# Patient Record
Sex: Female | Born: 1935
Health system: Southern US, Community
[De-identification: ages and names within clinical notes are randomized; demographics above are authoritative.]

## PROBLEM LIST (undated history)

## (undated) DIAGNOSIS — D649 Anemia, unspecified: Secondary | ICD-10-CM

## (undated) DIAGNOSIS — IMO0001 Reserved for inherently not codable concepts without codable children: Secondary | ICD-10-CM

## (undated) DIAGNOSIS — K635 Polyp of colon: Secondary | ICD-10-CM

## (undated) DIAGNOSIS — R0600 Dyspnea, unspecified: Secondary | ICD-10-CM

## (undated) DIAGNOSIS — E7800 Pure hypercholesterolemia, unspecified: Secondary | ICD-10-CM

## (undated) DIAGNOSIS — K859 Acute pancreatitis without necrosis or infection, unspecified: Secondary | ICD-10-CM

## (undated) DIAGNOSIS — F419 Anxiety disorder, unspecified: Secondary | ICD-10-CM

## (undated) DIAGNOSIS — G629 Polyneuropathy, unspecified: Secondary | ICD-10-CM

## (undated) DIAGNOSIS — Z531 Procedure and treatment not carried out because of patient's decision for reasons of belief and group pressure: Secondary | ICD-10-CM

## (undated) DIAGNOSIS — I499 Cardiac arrhythmia, unspecified: Secondary | ICD-10-CM

## (undated) DIAGNOSIS — C4492 Squamous cell carcinoma of skin, unspecified: Secondary | ICD-10-CM

## (undated) DIAGNOSIS — M1990 Unspecified osteoarthritis, unspecified site: Secondary | ICD-10-CM

## (undated) DIAGNOSIS — I4891 Unspecified atrial fibrillation: Secondary | ICD-10-CM

## (undated) DIAGNOSIS — K219 Gastro-esophageal reflux disease without esophagitis: Secondary | ICD-10-CM

## (undated) DIAGNOSIS — D509 Iron deficiency anemia, unspecified: Secondary | ICD-10-CM

## (undated) DIAGNOSIS — E119 Type 2 diabetes mellitus without complications: Secondary | ICD-10-CM

## (undated) DIAGNOSIS — H269 Unspecified cataract: Secondary | ICD-10-CM

## (undated) DIAGNOSIS — I10 Essential (primary) hypertension: Secondary | ICD-10-CM

## (undated) HISTORY — DX: Unspecified atrial fibrillation: I48.91

## (undated) HISTORY — DX: Acute pancreatitis without necrosis or infection, unspecified: K85.90

## (undated) HISTORY — DX: Unspecified osteoarthritis, unspecified site: M19.90

## (undated) HISTORY — DX: Unspecified cataract: H26.9

## (undated) HISTORY — DX: Anemia, unspecified: D64.9

## (undated) HISTORY — DX: Type 2 diabetes mellitus without complications: E11.9

## (undated) HISTORY — DX: Polyp of colon: K63.5

## (undated) HISTORY — DX: Gastro-esophageal reflux disease without esophagitis: K21.9

## (undated) HISTORY — DX: Anxiety disorder, unspecified: F41.9

## (undated) HISTORY — DX: Squamous cell carcinoma of skin, unspecified: C44.92

## (undated) HISTORY — DX: Cardiac arrhythmia, unspecified: I49.9

## (undated) HISTORY — PX: CATARACT EXTRACTION, BILATERAL: SHX1313

## (undated) HISTORY — DX: Polyneuropathy, unspecified: G62.9

## (undated) HISTORY — DX: Pure hypercholesterolemia, unspecified: E78.00

## (undated) HISTORY — DX: Essential (primary) hypertension: I10

## (undated) HISTORY — DX: Iron deficiency anemia, unspecified: D50.9

---
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
## 1964-10-24 HISTORY — PX: BREAST EXCISIONAL BIOPSY: SUR124

## 1976-10-24 HISTORY — PX: VAGINAL HYSTERECTOMY: SUR661

## 1979-10-25 HISTORY — PX: OTHER SURGICAL HISTORY: SHX169

## 1998-05-11 ENCOUNTER — Ambulatory Visit (HOSPITAL_COMMUNITY): Admission: RE | Admit: 1998-05-11 | Discharge: 1998-05-11 | Payer: Self-pay | Admitting: Family Medicine

## 1999-08-18 ENCOUNTER — Encounter: Payer: Self-pay | Admitting: Family Medicine

## 1999-08-18 ENCOUNTER — Ambulatory Visit (HOSPITAL_COMMUNITY): Admission: RE | Admit: 1999-08-18 | Discharge: 1999-08-18 | Payer: Self-pay | Admitting: Family Medicine

## 2001-03-14 ENCOUNTER — Ambulatory Visit (HOSPITAL_COMMUNITY): Admission: RE | Admit: 2001-03-14 | Discharge: 2001-03-14 | Payer: Self-pay | Admitting: Family Medicine

## 2001-03-14 ENCOUNTER — Encounter: Payer: Self-pay | Admitting: Family Medicine

## 2002-07-03 ENCOUNTER — Encounter: Payer: Self-pay | Admitting: Family Medicine

## 2002-07-03 ENCOUNTER — Ambulatory Visit (HOSPITAL_COMMUNITY): Admission: RE | Admit: 2002-07-03 | Discharge: 2002-07-03 | Payer: Self-pay | Admitting: Family Medicine

## 2003-12-08 ENCOUNTER — Ambulatory Visit (HOSPITAL_COMMUNITY): Admission: RE | Admit: 2003-12-08 | Discharge: 2003-12-08 | Payer: Self-pay | Admitting: Family Medicine

## 2004-02-05 ENCOUNTER — Encounter: Admission: RE | Admit: 2004-02-05 | Discharge: 2004-05-05 | Payer: Self-pay | Admitting: Family Medicine

## 2004-07-11 ENCOUNTER — Emergency Department (HOSPITAL_COMMUNITY): Admission: EM | Admit: 2004-07-11 | Discharge: 2004-07-11 | Payer: Self-pay | Admitting: Family Medicine

## 2005-06-06 ENCOUNTER — Ambulatory Visit (HOSPITAL_COMMUNITY): Admission: RE | Admit: 2005-06-06 | Discharge: 2005-06-06 | Payer: Self-pay | Admitting: Family Medicine

## 2006-04-05 ENCOUNTER — Ambulatory Visit: Payer: Self-pay | Admitting: Internal Medicine

## 2006-04-17 ENCOUNTER — Ambulatory Visit: Payer: Self-pay | Admitting: Internal Medicine

## 2006-10-02 ENCOUNTER — Ambulatory Visit (HOSPITAL_COMMUNITY): Admission: RE | Admit: 2006-10-02 | Discharge: 2006-10-02 | Payer: Self-pay | Admitting: Family Medicine

## 2007-02-02 ENCOUNTER — Encounter: Admission: RE | Admit: 2007-02-02 | Discharge: 2007-02-02 | Payer: Self-pay | Admitting: Family Medicine

## 2008-02-07 ENCOUNTER — Ambulatory Visit (HOSPITAL_COMMUNITY): Admission: RE | Admit: 2008-02-07 | Discharge: 2008-02-07 | Payer: Self-pay | Admitting: Family Medicine

## 2008-10-24 HISTORY — PX: CHOLECYSTECTOMY: SHX55

## 2008-11-10 ENCOUNTER — Inpatient Hospital Stay (HOSPITAL_COMMUNITY): Admission: EM | Admit: 2008-11-10 | Discharge: 2008-11-12 | Payer: Self-pay | Admitting: Emergency Medicine

## 2008-11-11 ENCOUNTER — Encounter (INDEPENDENT_AMBULATORY_CARE_PROVIDER_SITE_OTHER): Payer: Self-pay | Admitting: General Surgery

## 2009-04-16 ENCOUNTER — Ambulatory Visit (HOSPITAL_COMMUNITY): Admission: RE | Admit: 2009-04-16 | Discharge: 2009-04-16 | Payer: Self-pay | Admitting: Family Medicine

## 2010-03-09 IMAGING — US US ABDOMEN COMPLETE
1 series · 13 of 25 positions shown · non-contrast
Comparison: Abdominal ultrasound 02/02/2007

CLINICAL DATA: Severe abdominal pain

ABDOMEN ULTRASOUND
TECHNIQUE: Complete abdominal ultrasound examination was performed
including evaluation of the liver, gallbladder, bile ducts,
pancreas, kidneys, spleen, IVC, and abdominal aorta.

[Series 1: unknown · 0.34mm/px · 13 of 65 slices shown]
[im 1/65]
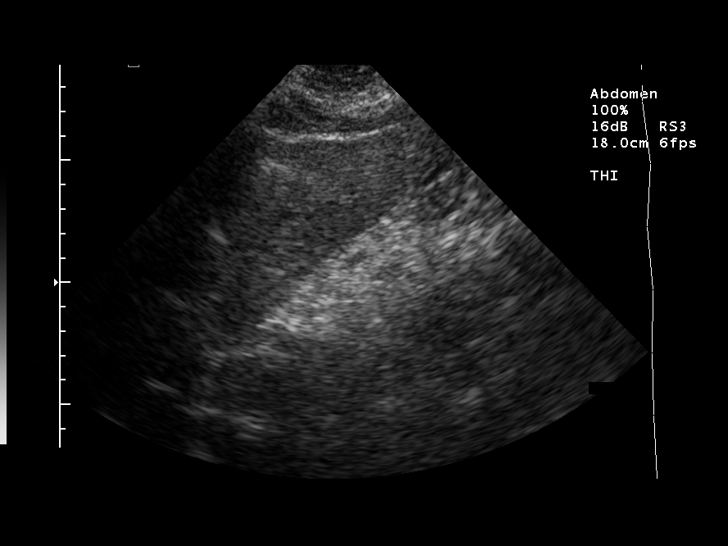
[im 6/65]
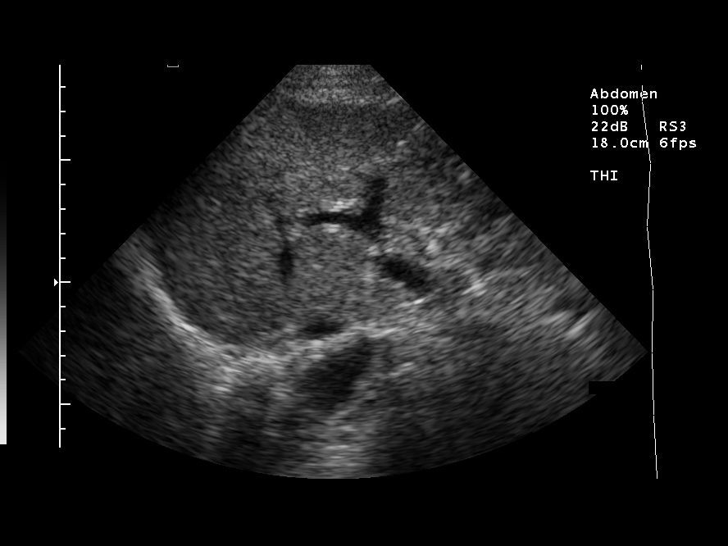
[im 11/65]
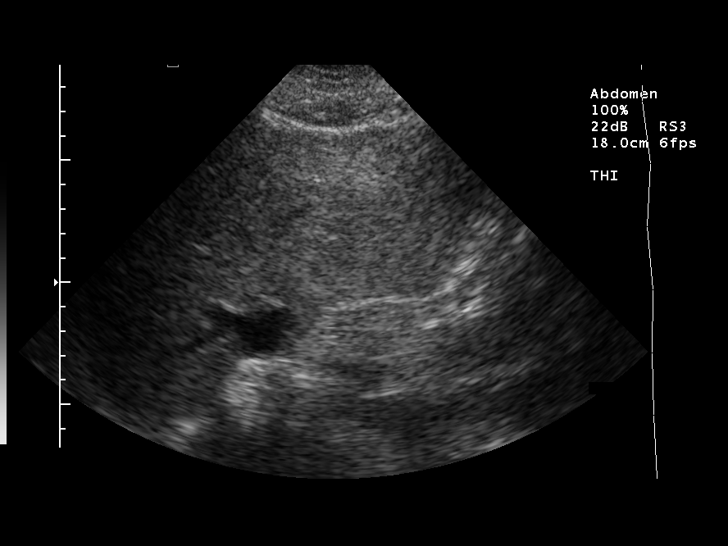
[im 17/65]
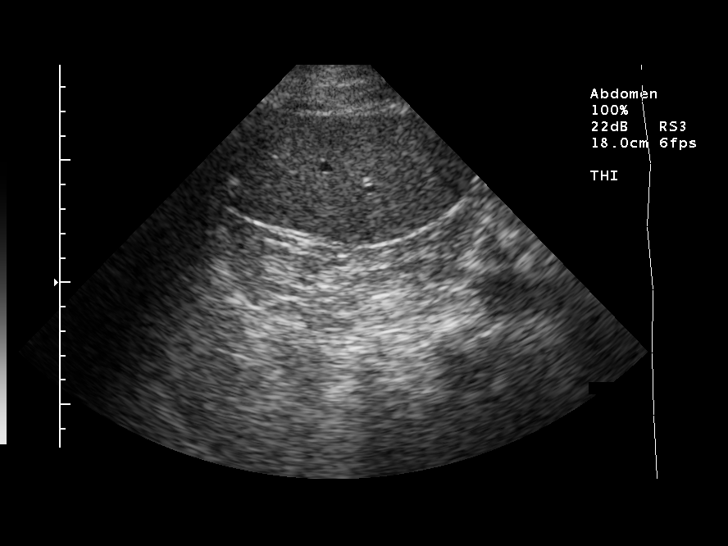
[im 22/65]
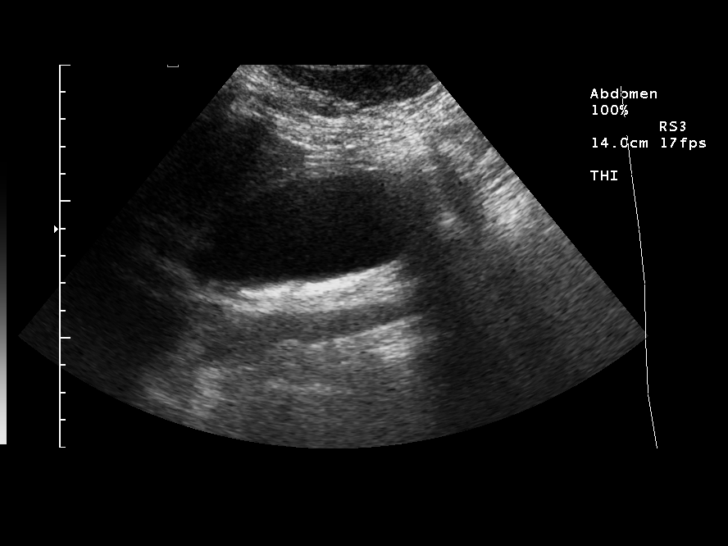
[im 27/65]
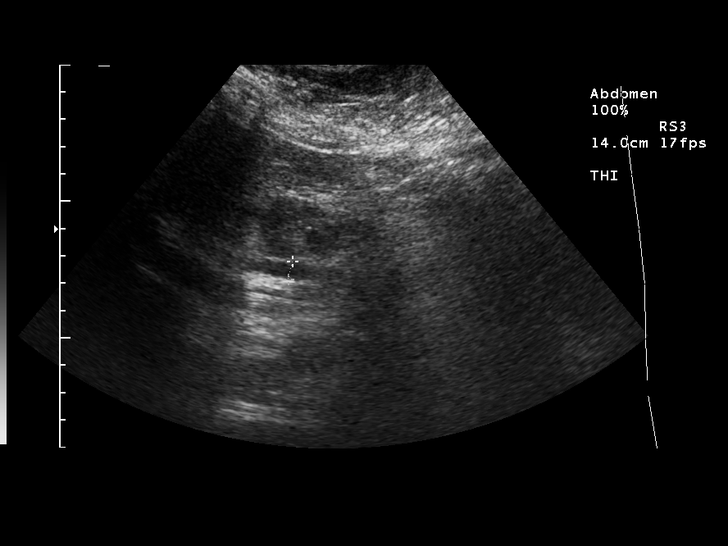
[im 33/65]
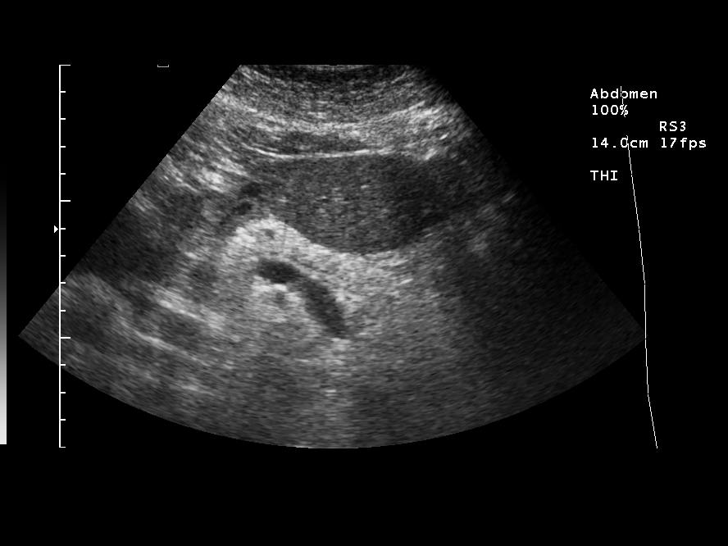
[im 38/65]
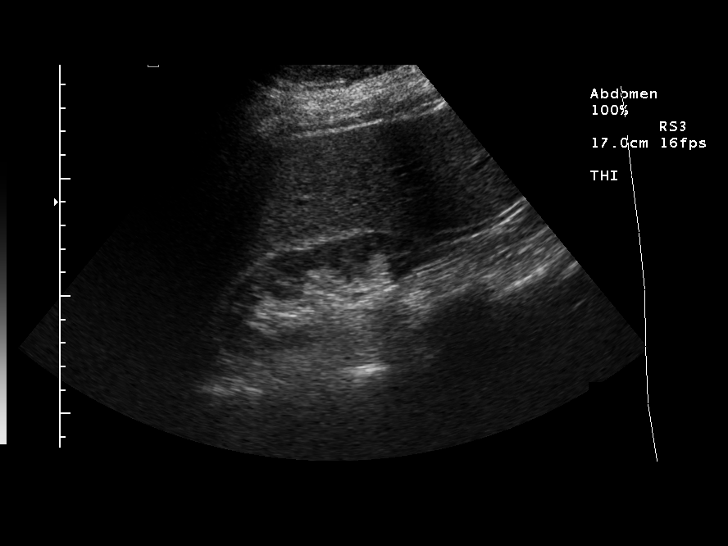
[im 43/65]
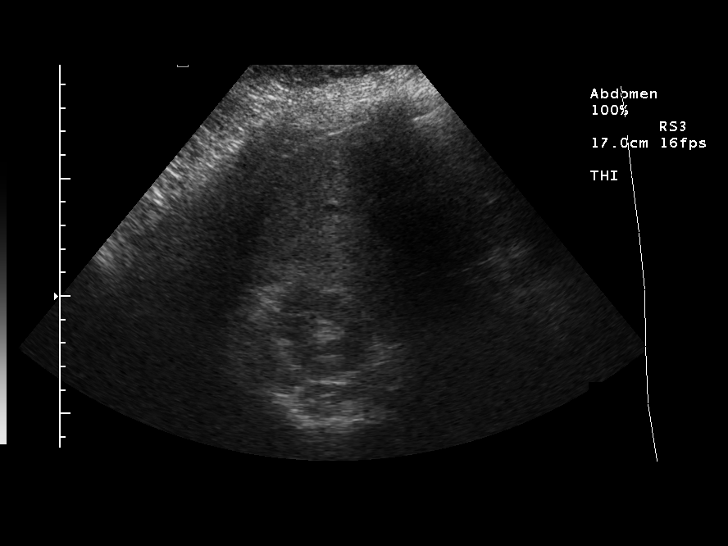
[im 49/65]
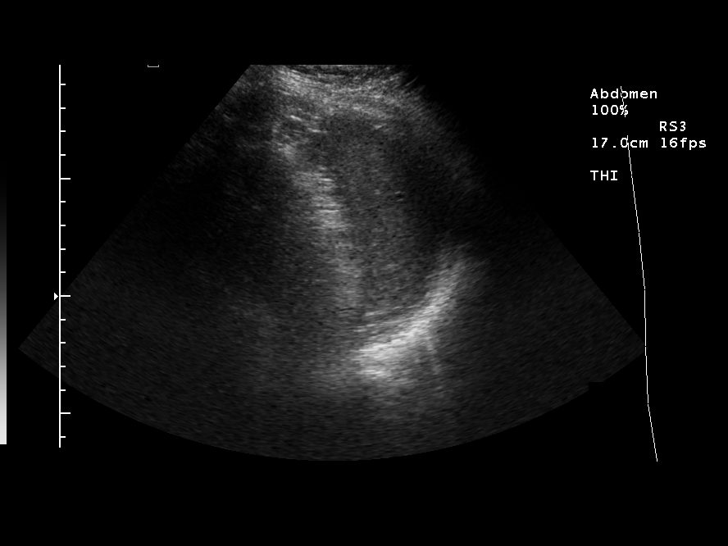
[im 54/65]
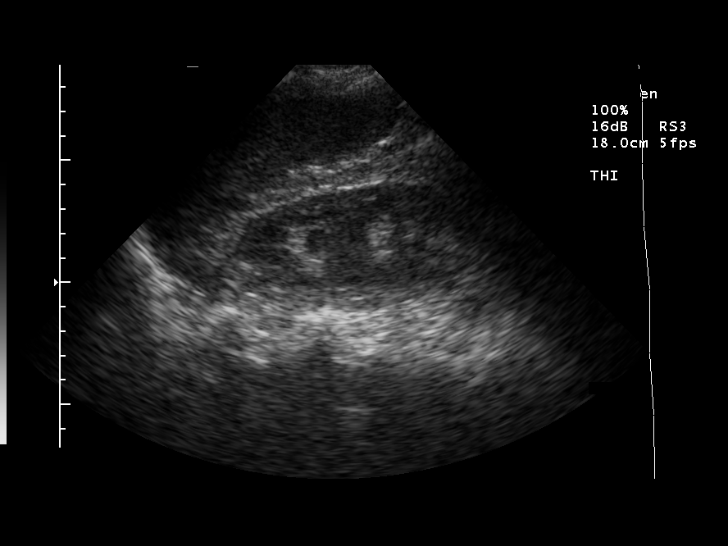
[im 59/65]
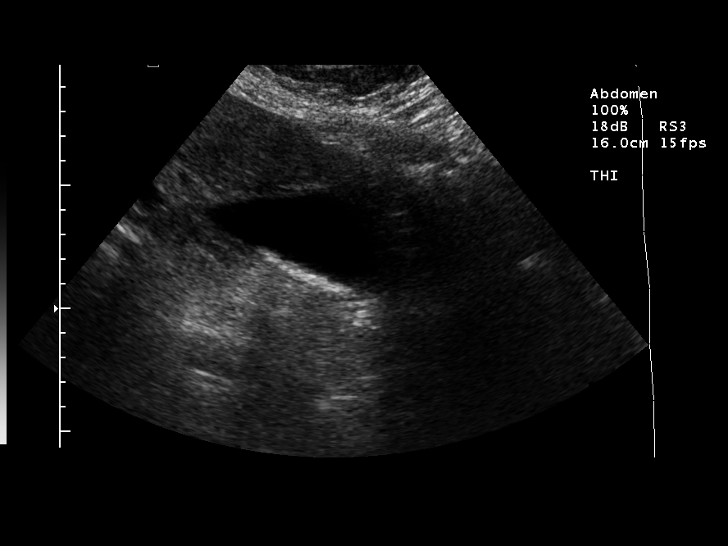
[im 65/65]
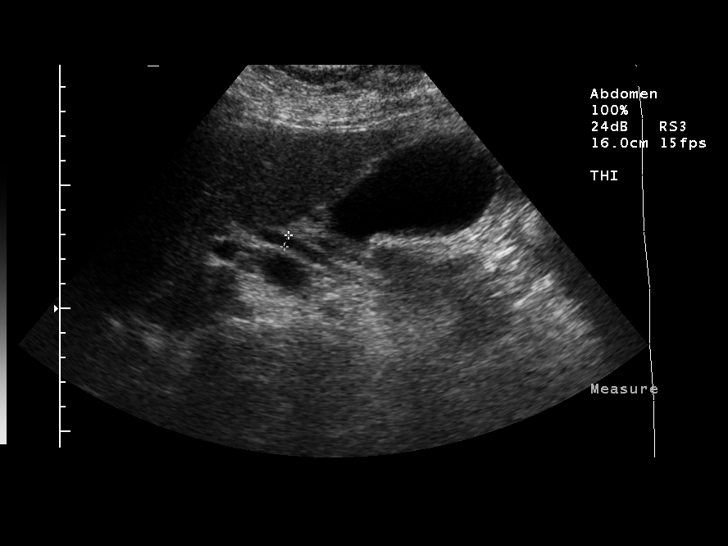

[13 of 25 positions shown; findings below may reference images not displayed]

FINDINGS: There is mild coarsened echogenicity.

Layering sludge versus small stones within the gallbladder.  No
discrete shadowing stones.  The gallbladder wall is thickened to 5
mm and edematous.  There is pericholecystic fluid present.
Positive sonographic Murphy's sign.  Common bile duct is normal at
4.5 mm.

The IVC and pancreas appear normal.

Right kidney measures 11.3 cm.  Left kidney measures 12.4 cm.  No
evidence of hydronephrosis.

The spleen is normal 10.6 cm.

Abdominal aorta is normal caliber 2.4 cm.
IMPRESSION: 1.  Thickened gallbladder wall with pericholecystic fluid and
positive sonographic Murphy's sign is concerning for acute
cholecystitis.  Layering sludge versus small stones are again
demonstrated.
2.  Common bile duct is normal in diameter.

3.  Increased liver echogenicity commonly represents hepatic
steatosis.

Critical test results telephoned to Dr. Nya at the time of
interpretation on 11/10/2008 at [DATE] .

## 2010-04-20 ENCOUNTER — Ambulatory Visit (HOSPITAL_COMMUNITY): Admission: RE | Admit: 2010-04-20 | Discharge: 2010-04-20 | Payer: Self-pay | Admitting: Family Medicine

## 2010-11-14 ENCOUNTER — Encounter: Payer: Self-pay | Admitting: Family Medicine

## 2011-02-07 LAB — CBC
HCT: 33 % — ABNORMAL LOW (ref 36.0–46.0)
Hemoglobin: 11.1 g/dL — ABNORMAL LOW (ref 12.0–15.0)
MCHC: 33.2 g/dL (ref 30.0–36.0)
MCV: 96.4 fL (ref 78.0–100.0)
Platelets: 198 10*3/uL (ref 150–400)
RBC: 4.11 MIL/uL (ref 3.87–5.11)
RDW: 13.3 % (ref 11.5–15.5)
WBC: 4.4 10*3/uL (ref 4.0–10.5)

## 2011-02-07 LAB — HEPATIC FUNCTION PANEL
ALT: 189 U/L — ABNORMAL HIGH (ref 0–35)
Alkaline Phosphatase: 66 U/L (ref 39–117)
Alkaline Phosphatase: 73 U/L (ref 39–117)
Bilirubin, Direct: 0.2 mg/dL (ref 0.0–0.3)
Indirect Bilirubin: 0.4 mg/dL (ref 0.3–0.9)
Indirect Bilirubin: 1 mg/dL — ABNORMAL HIGH (ref 0.3–0.9)
Total Bilirubin: 0.6 mg/dL (ref 0.3–1.2)

## 2011-02-07 LAB — COMPREHENSIVE METABOLIC PANEL
ALT: 278 U/L — ABNORMAL HIGH (ref 0–35)
ALT: 597 U/L — ABNORMAL HIGH (ref 0–35)
AST: 1138 U/L — ABNORMAL HIGH (ref 0–37)
AST: 116 U/L — ABNORMAL HIGH (ref 0–37)
Albumin: 2.9 g/dL — ABNORMAL LOW (ref 3.5–5.2)
Albumin: 3.6 g/dL (ref 3.5–5.2)
Alkaline Phosphatase: 68 U/L (ref 39–117)
BUN: 8 mg/dL (ref 6–23)
BUN: 8 mg/dL (ref 6–23)
Calcium: 8.8 mg/dL (ref 8.4–10.5)
Chloride: 104 mEq/L (ref 96–112)
Chloride: 104 mEq/L (ref 96–112)
GFR calc Af Amer: >60 mL/min (ref >60)
GFR calc non Af Amer: >60 mL/min (ref >60)
Glucose, Bld: 176 mg/dL — ABNORMAL HIGH (ref 70–99)
Potassium: 3.3 mEq/L — ABNORMAL LOW (ref 3.5–5.1)
Potassium: 3.8 mEq/L (ref 3.5–5.1)
Sodium: 139 mEq/L (ref 135–145)
Sodium: 142 mEq/L (ref 135–145)
Total Bilirubin: 0.9 mg/dL (ref 0.3–1.2)
Total Protein: 6.7 g/dL (ref 6.0–8.3)

## 2011-02-07 LAB — URINALYSIS, ROUTINE W REFLEX MICROSCOPIC
Glucose, UA: NEGATIVE mg/dL
Nitrite: NEGATIVE
Protein, ur: NEGATIVE mg/dL
pH: 5 (ref 5.0–8.0)

## 2011-02-07 LAB — CREATININE, SERUM
GFR calc Af Amer: >60 mL/min (ref >60)
GFR calc non Af Amer: >60 mL/min (ref >60)

## 2011-02-07 LAB — URINE MICROSCOPIC-ADD ON

## 2011-02-07 LAB — GLUCOSE, CAPILLARY
Glucose-Capillary: 124 mg/dL — ABNORMAL HIGH (ref 70–99)
Glucose-Capillary: 128 mg/dL — ABNORMAL HIGH (ref 70–99)
Glucose-Capillary: 132 mg/dL — ABNORMAL HIGH (ref 70–99)
Glucose-Capillary: 152 mg/dL — ABNORMAL HIGH (ref 70–99)
Glucose-Capillary: 189 mg/dL — ABNORMAL HIGH (ref 70–99)
Glucose-Capillary: 190 mg/dL — ABNORMAL HIGH (ref 70–99)
Glucose-Capillary: 233 mg/dL — ABNORMAL HIGH (ref 70–99)
Glucose-Capillary: 91 mg/dL (ref 70–99)

## 2011-02-07 LAB — DIFFERENTIAL
Lymphocytes Relative: 15 % (ref 12–46)
Monocytes Absolute: 0.4 10*3/uL (ref 0.1–1.0)
Monocytes Relative: 6 % (ref 3–12)
Neutrophils Relative %: 77 % (ref 43–77)

## 2011-02-07 LAB — LIPID PANEL: VLDL: 45 mg/dL — ABNORMAL HIGH (ref 0–40)

## 2011-02-07 LAB — POTASSIUM: Potassium: 4.6 mEq/L (ref 3.5–5.1)

## 2011-02-07 LAB — AMYLASE: Amylase: 253 U/L — ABNORMAL HIGH (ref 27–131)

## 2011-03-08 NOTE — Discharge Summary (Signed)
NAME:  Danielle [name], Danielle [name]                ACCOUNT NO.:  [id number]   MEDICAL RECORD NO.:  [id number]          PATIENT TYPE:  INP   LOCATION:  5118                         FACILITY:  [facility]   PHYSICIAN:  Adolph Pollack, M.D.DATE OF BIRTH:  [birth year]   DATE OF ADMISSION:  11/09/2008  DATE OF DISCHARGE:  11/12/2008                               DISCHARGE SUMMARY   ADMITTING PHYSICIAN:  Ardeth Sportsman, MD   DISCHARGE PHYSICIAN:  Adolph Pollack, MD   There were no consultants.   PROCEDURE:  Laparoscopic cholecystectomy with intraoperative  cholangiogram done by Dr. Abbey Chatters on November 11, 2008.   REASON FOR ADMISSION:  Danielle [name] is a 76 year old female who has been  having intermittent abdominal pain for the past few years.  Apparently,  she has had 5 attacks earlier this year.  The morning of admission, she  woke up and was having severe right upper quadrant abdominal pain and  she also had some associated nausea.  At that time, she presented to the  emergency department where she was found to have a lipase of 6348, along  with a total bilirubin of 1.4 with AST of 1138 and ALT of 597.  On  ultrasound the gallbladder shows wall thickening of pericholecystic  fluid and a sonographic Murphy sign.  Please see admitting history and  physical for further details.   ADMITTING DIAGNOSES:  1. Biliary pancreatitis.  2. Acute cholecystitis.  3. Diabetes mellitus.  4. Hypertension.  5. History of diabetic neuropathy.   HOSPITAL COURSE:  At this time, the patient was admitted and started on  IV antibiotics including clindamycin and Cipro.  She was kept n.p.o.  secondary to her pancreatitis.  On hospital day 1, the patient was  feeling better and no longer having that much abdominal pain.  Her  lipase was still elevated at this time, the patient was continued n.p.o.  along with antibiotics to cool off prior to having laparoscopic  cholecystectomy.  By hospital day 2, the patient's lipase  had decreased  from greater than 2269 and all of her other LFTs had also decreased with  an AST now of 116 and a ALT of 278.  At this time, her total bilirubin  had also decreased from 1.7 to 0.9.  On exam, her abdomen was soft with  very minimal right upper quadrant tenderness.  At this time, it was felt  the patient was stable for laparoscopic cholecystectomy.  This was done  and the patient did not have any complications and tolerated the  procedure well.  On postoperative day 1, the patient was doing well and  her pain was well controlled with her Percocet.  She was tolerating a  regular diet.  On exam, her abdomen was soft, appropriately tender with  active bowel sounds and her incisions were clean, dry, and intact.  At  this time, the patient was stable for discharge home.   DISCHARGE DIAGNOSES:  1. Biliary pancreatitis.  2. Acute cholecystitis.  3. Status post laparoscopic cholecystectomy.  4. Diabetes mellitus.  5. Hypertension.  6. History of diabetic neuropathy.  DISCHARGE MEDICATIONS:  The patient may resume all home medications  including;  1. Actos 45 mg daily.  2. Glucophage 1000 mg 2 times a day.  3. Lipitor 45 mg daily.  4. Neurontin 300 mg daily.  5. Lisinopril 20 mg daily.  6. Aspirin 81 mg daily.  7. Lantus insulin 22 or 23 units subcu, it looks like at hour of      sleep.  8. She was also given a prescription for Percocet 5/325 one to two      p.o. q.4 h. p.r.n. pain.  9. She was also informed she may take any over-the-counter stool      softeners as needed for constipation.   DISCHARGE INSTRUCTIONS:  The patient may resume her normal diabetic  diet.  She may increase her activity slowly and walk up steps.  She may  shower; however, she is not to bathe for the next few weeks.  She does  not need to do any heavy lifting greater than 10 or 15 pounds for the  next few weeks and she is not to drive while taking narcotic pain  medicines.  She is informed she  may  remove her incision dressings in  approximately 2 or 3 days.  She is informed to call our office for fever  greater than 101.5, worsening abdominal pain, no redness, or pus-like  drainage from any of her incisions.  Otherwise, she is to follow up with  the [facility] on November 25, 2008, at 2 o'clock.      Letha Cape, PA      Adolph Pollack, M.D.  Electronically Signed    KEO/MEDQ  D:  11/12/2008  T:  11/12/2008  Job:  [id number]   cc:   Duncan Dull, M.D.

## 2011-03-08 NOTE — Op Note (Signed)
NAME:  VONDELL, SOWELL                ACCOUNT NO.:  [id number]   MEDICAL RECORD NO.:  [id number]          PATIENT TYPE:  INP   LOCATION:  5118                         FACILITY:  [facility]   PHYSICIAN:  Adolph Pollack, M.D.DATE OF BIRTH:  [birth year]   DATE OF PROCEDURE:  11/11/2008  DATE OF DISCHARGE:                               OPERATIVE REPORT   PREOPERATIVE DIAGNOSIS:  Gallstone pancreatitis.   POSTOPERATIVE DIAGNOSIS:  Gallstone pancreatitis.   PROCEDURE:  Laparoscopic cholecystectomy with intraoperative  cholangiogram.   SURGEON:  Adolph Pollack, MD   ASSISTANT:  Clovis Pu. Cornett, MD   ANESTHESIA:  General.   INDICATIONS:  This 76 year old female was admitted with gallstone  pancreatitis.  She has progressively improved and now presents for  urgent cholecystectomy.   TECHNIQUE:  She was brought to the operating room, placed supine on the  operating table, and general anesthetic was administered.  Her abdominal  wall was sterilely prepped and draped.  Marcaine was infiltrated in the  subumbilical region.  A small subumbilical incision was made through the  skin and subcutaneous tissues, fascia, and peritoneum.  The peritoneal  cavity was entered under direct vision.  A pursestring suture of 0  Vicryl was placed around the fascial edges.  A Hasson trocar was  introduced to the peritoneal cavity and pneumoperitoneum created by  insufflation of CO2 gas.   Following this, a laparoscope was introduced.  She was placed in reverse  Trendelenburg position with the right side tilted slightly up.  An 11-mm  trocar was placed through an epigastric incision and two 5-mm trocars  were placed in the right upper quadrant.  The fundus of the gallbladder  was grasped with omental adhesions to it and these were taken down  bluntly staying close to the gallbladder.  The fundus of the gallbladder  was then able to be retracted toward the right shoulder.  I then  mobilized the  infundibulum of the gallbladder with careful blunt  dissection and then retracted it laterally.  There was an anterior  branch of the cystic artery which I identified lying anterior to the  cystic duct.  A window was created around the anterior branch of the  cystic artery and it was clipped and divided.  I then was able to  isolate the cystic duct and create a window around it.  A clip was  placed in the cystic duct gallbladder junction.  A small incision was  made in the cystic duct.  I then placed a cholangiocatheter through the  anterior abdominal wall, placed into the cystic duct, and a  cholangiogram performed.   Under real time fluoroscopy, dilute contrast was injected to the cystic  duct which was of long length.  Common hepatic, right left hepatic,  common bile ducts were all filled and contrast drained promptly to the  duodenum without any obvious evidence of obstruction.  Final reports  pending the radiologist's interpretation.   Following this, the cholangiocatheter was removed, the cystic duct was  clipped 3 times on the biliary side, and divided.  I identified the  posterior  branch of the cystic artery and it was clipped and divided.  I  then dissected the gallbladder free from liver using electrocautery and  placed in Endopouch bag.  The gallbladder fossa was copiously irrigated  and was hemostatic.  The irrigation fluid was evacuated.  The  gallbladder was then removed through the subumbilical port.   I re-inspected the gallbladder fossa and it was hemostatic without  evidence of bile leak.  I then removed the trocars and released the  pneumoperitoneum.  Skin incisions were closed with 4-0 Monocryl  subcuticular stitches.  Steri-Strips and sterile dressings were applied.  She tolerated the procedure well without any apparent complications and  was taken to recovery room in satisfactory condition.      Adolph Pollack, M.D.  Electronically Signed     TJR/MEDQ   D:  11/12/2008  T:  11/12/2008  Job:  [id number]

## 2011-03-08 NOTE — H&P (Signed)
NAME:  Danielle [name], Danielle [name]                ACCOUNT NO.:  [id number]   MEDICAL RECORD NO.:  [id number]          PATIENT TYPE:  INP   LOCATION:  1825                         FACILITY:  [facility]   PHYSICIAN:  Ardeth Sportsman, MD     DATE OF BIRTH:  1935-09-28   DATE OF ADMISSION:  11/09/2008  DATE OF DISCHARGE:                              HISTORY & PHYSICAL   PRIMARY CARE PHYSICIAN:  Duncan Dull, MD   REQUESTING PHYSICIAN:  __________ at [facility] Emergency Department.   SURGEON:  Dr. Karie Soda, [facility]   REASON FOR CONSULT ON ADMISSION:  1. Cholecystitis.  2. Probable gallstone pancreatitis.  3. Transaminitis, possible hepatitis.   HISTORY OF PRESENT ILLNESS:  Danielle [name] is a 76 year old obese female  who has had intermittent abdominal pains for the past few years.  She  has had 5 attacks earlier this year.  She had a more severe pain, which  she woke up with this morning.  Pain has been in the upper abdomen,  right greater than the left side.  Early, she wakes up with the pain and  after having some coffee, she gets little bit nauseated and sometimes  throws up and has some diarrhea.  She denies any sick contacts or travel  history.  No hematochezia or melena.  She does have some mild heartburn  and reflux for which she takes Tums and occasional Zantac, this does not  seem like that.   Her pain worsens through the day and based on that she came to the  emergency room almost at 8 p.m. tonight.  Workup is concerning for  cholecystis along with some elevated liver function tests and pancreas  and therefore, surgical consultation was requested by Dr. Randa Evens.   PAST MEDICAL HISTORY:  1. Hypertension.  2. Diabetes.  3. Cholecystolithiasis, large versus small stones based on ultrasound      in April 2008.  4. Low back pain.  5. Diabetic neuropathy.  6. Question of gastroesophageal reflux disease.  7. Hypercholesterolemia.   PAST SURGICAL HISTORY:  She had an  appendectomy at age 37.  She has had  an abdominal hysterectomy.  She had removal of right breast cyst and  right ankle surgery.   ALLERGIES:  AMOXICILLIN and PENICILLIN and questionable allergy to  CODEINE.   MEDICATIONS:  As noted in the chart include Actos, Glucophage, Lantus,  aspirin, Lipitor, lisinopril, multivitamin, Neurontin, Tums, and vitamin  C.   SOCIAL HISTORY:  No tobacco, alcohol, or drug use.  She is here today  with her husband, and her daughter works in [employer] as a Scientist, water quality.   FAMILY HISTORY:  Noncontributory for any major hepatobiliary or  inflammatory bowel disorders that she can recall.   REVIEW OF SYSTEMS:  Noted as per HPI.  CONSTITUTIONAL:  No fever,  chills, or sweat.  No weight gain or weight loss.  EYES:  Negative.  ENT:  Negative.  CARDIAC:  Negative.  RESPIRATORY:  Negative.  She  claims she can walk several miles without much difficulty.  No history  of dyspnea on exertion.  No exertional chest pain or angina.  ABDOMEN:  No sick contacts or travel history.  No history of food poisoning  recently that she can recall.  She normally has a bowel movement about  once or twice a day.  No severe bouts of constipation or diarrhea.  She  occasionally has loose stools with the abdominal pain.  BREAST:  In the  past, normal stable mammograms.  HEME:  Negative.  LYMPH:  Negative.  ALLERGIC:  Negative.  PSYCHIATRIC:  Negative.  NEUROLOGIC:  Some pain in  the feet.  Neurontin seems to help a little bit in the chronic low back  pain.  MUSCULOSKELETAL:  As noted above with some chronic low back pain.   PHYSICAL EXAMINATION:  VITAL SIGNS:  Her temperature is 98.8, her blood  pressure is 130/49, respirations 16, pulses in the 70s, initial pain was  9/10 and now it is down to about 3/10 pain.  She has 99% saturations on  room air.  GENERAL:  She is a well-developed, well-nourished, obese female, lying  in bed, in no acute distress.  PSYCH:  She  seems relaxed with good insight and answered appropriately  to questions and is rather curious.  No evidence of any dementia,  delirium, psychosis, or paranoia.  EYES:  Pupils equal, round, and reactive to light.  Sclerae nonicteric.  Extraocular movements are intact.  NECK:  Supple, no masses.  Trachea is midline.  HEART:  Regular rate and rhythm.  No murmurs, gallops, or rubs.  CHEST:  Clear to auscultation bilaterally.  No wheezes, rales, or  rhonchi.  No pain to rib or sternal compression.  HEENT:  She is normocephalic.  Mucous membranes are somewhat dry, but  nasopharynx and oropharynx are clear.  ABDOMEN:  Soft, but obese.  Belly button shows no hernia.  She has some  mild abdominal discomfort in the upper abdomen right slightly greater  than the left, but no Murphy sign to my examination.  GENITALIA:  External female genitalia with no inguinal hernias.  RECTAL:  Refused.  MUSCULOSKELETAL:  Good range of motion in shoulders, elbows, wrist as  well as hip, knees, and ankles.  LYMPH:  No head, neck, axillary or groin lymphadenopathy.  SKIN:  No petechiae.  No purpura.  No spider telangiectasia or angioma.   LABORATORY VALUES:  Her white count is in normal range around 6.  Her  lipase is 6348.  Her urinalysis just shows trace leukocyte esterase, but  everything else is negative.  Her AST is 1138, ALT is 597, total  bilirubin is 1.4, and slightly elevated alk phos is 77.   She has an ultrasound shows a gallbladder wall thickening with  pericholecystic fluid, sonographic Murphy sign.   ASSESSMENT AND PLAN:  64. A 76 year old female with history concerning for intermittent      biliary colic now with transaminitis and elevated lipase concerning      for gallstone pancreatitis with possible cholecystis, want admit.  2. IV antibiotics.  3. CT scan of the pelvis to make sure that there is no other      abnormalities to explain the markedly elevated liver and pancreas      numbers.   She has no biliary ductal dilatations at this time, which      is all surprising given those numbers but perhaps, she has passed      the stone.  She does not have any history of any alcohol use on  Lipitor.  I doubt that she has any history of hypertriglyceridemia,      although.  She has been on Lipitor for several years.  I do not      think transaminitis can be explained by that.  If her CT scan is      otherwise underwhelming, she may benefit from a hepatitis profile.      We will hold off on that for now and see if her numbers resolve on      their own.  4. I suspect at some point she is going to need a cholecystectomy as      her intermittent symptoms seem more consistent with biliary colic      than anything else.  The anatomy and physiology of hepatobiliary      and pancreatic function was discussed.  Pathophysiology includes      cholecystolithiasis with its risks of cholecystitis,      choledocholithiasis, gallstone, pancreatitis, etc., were discussed.      Options were discussed and recommendation was made for laparoscopic      cholecystectomy with intraoperative cholangiogram.  Risks,      benefits, and alternatives were discussed.  Questions were      answered.  She and her husband agreed to proceed.  I think we need      to wait until lipase is more in normal range and workup is more      complete.  Make sure there is no surprises or any other etiology      for her elevated laboratory values.  5. We will place on IV clindamycin and ciprofloxacin for antibiotic      coverage, given her PENICILLIN allergy.  6. Deep vein thrombosis prophylaxis.  7. Ulcer prophylaxis.  We will put her on some Protonix and follow up      for now.  8. Probable gastroenterology consult when further along.  We will see      if she can spontaneously resolve.      Ardeth Sportsman, MD  Electronically Signed     SCG/MEDQ  D:  11/10/2008  T:  11/10/2008  Job:  [id number]   cc:   Duncan Dull,  M.D.

## 2011-05-10 ENCOUNTER — Encounter: Payer: Self-pay | Admitting: Internal Medicine

## 2011-11-16 DIAGNOSIS — Z0000 Encounter for general adult medical examination without abnormal findings: Secondary | ICD-10-CM | POA: Diagnosis not present

## 2011-11-16 DIAGNOSIS — E1142 Type 2 diabetes mellitus with diabetic polyneuropathy: Secondary | ICD-10-CM | POA: Diagnosis not present

## 2011-11-16 DIAGNOSIS — E782 Mixed hyperlipidemia: Secondary | ICD-10-CM | POA: Diagnosis not present

## 2011-11-16 DIAGNOSIS — E1149 Type 2 diabetes mellitus with other diabetic neurological complication: Secondary | ICD-10-CM | POA: Diagnosis not present

## 2011-11-16 DIAGNOSIS — M549 Dorsalgia, unspecified: Secondary | ICD-10-CM | POA: Diagnosis not present

## 2011-11-16 DIAGNOSIS — N390 Urinary tract infection, site not specified: Secondary | ICD-10-CM | POA: Diagnosis not present

## 2011-11-16 DIAGNOSIS — I10 Essential (primary) hypertension: Secondary | ICD-10-CM | POA: Diagnosis not present

## 2011-11-17 ENCOUNTER — Other Ambulatory Visit (HOSPITAL_COMMUNITY): Payer: Self-pay | Admitting: Family Medicine

## 2011-11-17 DIAGNOSIS — Z1231 Encounter for screening mammogram for malignant neoplasm of breast: Secondary | ICD-10-CM

## 2011-11-21 ENCOUNTER — Ambulatory Visit (HOSPITAL_COMMUNITY)
Admission: RE | Admit: 2011-11-21 | Discharge: 2011-11-21 | Disposition: A | Discharge status: Home / Self Care | Payer: Medicare Other | Source: Ambulatory Visit | Attending: Family Medicine | Admitting: Family Medicine

## 2011-11-21 DIAGNOSIS — Z1231 Encounter for screening mammogram for malignant neoplasm of breast: Secondary | ICD-10-CM | POA: Insufficient documentation

## 2011-11-28 DIAGNOSIS — M5430 Sciatica, unspecified side: Secondary | ICD-10-CM | POA: Diagnosis not present

## 2011-11-28 DIAGNOSIS — M999 Biomechanical lesion, unspecified: Secondary | ICD-10-CM | POA: Diagnosis not present

## 2011-11-29 DIAGNOSIS — R319 Hematuria, unspecified: Secondary | ICD-10-CM | POA: Diagnosis not present

## 2011-11-30 DIAGNOSIS — M999 Biomechanical lesion, unspecified: Secondary | ICD-10-CM | POA: Diagnosis not present

## 2011-11-30 DIAGNOSIS — M5430 Sciatica, unspecified side: Secondary | ICD-10-CM | POA: Diagnosis not present

## 2011-12-01 DIAGNOSIS — M5430 Sciatica, unspecified side: Secondary | ICD-10-CM | POA: Diagnosis not present

## 2011-12-01 DIAGNOSIS — M999 Biomechanical lesion, unspecified: Secondary | ICD-10-CM | POA: Diagnosis not present

## 2011-12-05 DIAGNOSIS — M999 Biomechanical lesion, unspecified: Secondary | ICD-10-CM | POA: Diagnosis not present

## 2011-12-05 DIAGNOSIS — M5430 Sciatica, unspecified side: Secondary | ICD-10-CM | POA: Diagnosis not present

## 2011-12-06 DIAGNOSIS — M999 Biomechanical lesion, unspecified: Secondary | ICD-10-CM | POA: Diagnosis not present

## 2011-12-06 DIAGNOSIS — M5430 Sciatica, unspecified side: Secondary | ICD-10-CM | POA: Diagnosis not present

## 2011-12-08 DIAGNOSIS — M5430 Sciatica, unspecified side: Secondary | ICD-10-CM | POA: Diagnosis not present

## 2011-12-08 DIAGNOSIS — M999 Biomechanical lesion, unspecified: Secondary | ICD-10-CM | POA: Diagnosis not present

## 2011-12-13 DIAGNOSIS — M5430 Sciatica, unspecified side: Secondary | ICD-10-CM | POA: Diagnosis not present

## 2011-12-13 DIAGNOSIS — M999 Biomechanical lesion, unspecified: Secondary | ICD-10-CM | POA: Diagnosis not present

## 2011-12-13 DIAGNOSIS — R319 Hematuria, unspecified: Secondary | ICD-10-CM | POA: Diagnosis not present

## 2011-12-14 DIAGNOSIS — M999 Biomechanical lesion, unspecified: Secondary | ICD-10-CM | POA: Diagnosis not present

## 2011-12-14 DIAGNOSIS — M5430 Sciatica, unspecified side: Secondary | ICD-10-CM | POA: Diagnosis not present

## 2011-12-15 DIAGNOSIS — M999 Biomechanical lesion, unspecified: Secondary | ICD-10-CM | POA: Diagnosis not present

## 2011-12-15 DIAGNOSIS — M5430 Sciatica, unspecified side: Secondary | ICD-10-CM | POA: Diagnosis not present

## 2011-12-26 DIAGNOSIS — M999 Biomechanical lesion, unspecified: Secondary | ICD-10-CM | POA: Diagnosis not present

## 2011-12-26 DIAGNOSIS — M5430 Sciatica, unspecified side: Secondary | ICD-10-CM | POA: Diagnosis not present

## 2012-01-10 DIAGNOSIS — N3946 Mixed incontinence: Secondary | ICD-10-CM | POA: Diagnosis not present

## 2012-01-10 DIAGNOSIS — N318 Other neuromuscular dysfunction of bladder: Secondary | ICD-10-CM | POA: Diagnosis not present

## 2012-01-10 DIAGNOSIS — R3129 Other microscopic hematuria: Secondary | ICD-10-CM | POA: Diagnosis not present

## 2012-01-19 ENCOUNTER — Other Ambulatory Visit: Payer: Self-pay | Admitting: Dermatology

## 2012-01-19 DIAGNOSIS — D485 Neoplasm of uncertain behavior of skin: Secondary | ICD-10-CM | POA: Diagnosis not present

## 2012-01-19 DIAGNOSIS — L980 Pyogenic granuloma: Secondary | ICD-10-CM | POA: Diagnosis not present

## 2012-02-15 DIAGNOSIS — R3129 Other microscopic hematuria: Secondary | ICD-10-CM | POA: Diagnosis not present

## 2012-02-15 DIAGNOSIS — N318 Other neuromuscular dysfunction of bladder: Secondary | ICD-10-CM | POA: Diagnosis not present

## 2012-02-15 DIAGNOSIS — N3946 Mixed incontinence: Secondary | ICD-10-CM | POA: Diagnosis not present

## 2012-02-16 DIAGNOSIS — E1142 Type 2 diabetes mellitus with diabetic polyneuropathy: Secondary | ICD-10-CM | POA: Diagnosis not present

## 2012-02-16 DIAGNOSIS — E1149 Type 2 diabetes mellitus with other diabetic neurological complication: Secondary | ICD-10-CM | POA: Diagnosis not present

## 2012-03-29 DIAGNOSIS — B353 Tinea pedis: Secondary | ICD-10-CM | POA: Diagnosis not present

## 2012-03-29 DIAGNOSIS — B029 Zoster without complications: Secondary | ICD-10-CM | POA: Diagnosis not present

## 2012-06-06 DIAGNOSIS — E1149 Type 2 diabetes mellitus with other diabetic neurological complication: Secondary | ICD-10-CM | POA: Diagnosis not present

## 2012-06-06 DIAGNOSIS — E782 Mixed hyperlipidemia: Secondary | ICD-10-CM | POA: Diagnosis not present

## 2012-06-06 DIAGNOSIS — I10 Essential (primary) hypertension: Secondary | ICD-10-CM | POA: Diagnosis not present

## 2012-06-06 DIAGNOSIS — E1142 Type 2 diabetes mellitus with diabetic polyneuropathy: Secondary | ICD-10-CM | POA: Diagnosis not present

## 2012-06-08 DIAGNOSIS — H5210 Myopia, unspecified eye: Secondary | ICD-10-CM | POA: Diagnosis not present

## 2012-06-08 DIAGNOSIS — H2510 Age-related nuclear cataract, unspecified eye: Secondary | ICD-10-CM | POA: Diagnosis not present

## 2012-06-08 DIAGNOSIS — E119 Type 2 diabetes mellitus without complications: Secondary | ICD-10-CM | POA: Diagnosis not present

## 2012-06-08 DIAGNOSIS — H52209 Unspecified astigmatism, unspecified eye: Secondary | ICD-10-CM | POA: Diagnosis not present

## 2012-07-24 ENCOUNTER — Encounter: Payer: Self-pay | Admitting: Internal Medicine

## 2012-07-26 DIAGNOSIS — Z85828 Personal history of other malignant neoplasm of skin: Secondary | ICD-10-CM | POA: Diagnosis not present

## 2012-07-26 DIAGNOSIS — L570 Actinic keratosis: Secondary | ICD-10-CM | POA: Diagnosis not present

## 2012-07-26 DIAGNOSIS — L821 Other seborrheic keratosis: Secondary | ICD-10-CM | POA: Diagnosis not present

## 2012-09-25 DIAGNOSIS — R82998 Other abnormal findings in urine: Secondary | ICD-10-CM | POA: Diagnosis not present

## 2012-09-25 DIAGNOSIS — N390 Urinary tract infection, site not specified: Secondary | ICD-10-CM | POA: Diagnosis not present

## 2012-12-12 DIAGNOSIS — I10 Essential (primary) hypertension: Secondary | ICD-10-CM | POA: Diagnosis not present

## 2012-12-12 DIAGNOSIS — E7800 Pure hypercholesterolemia, unspecified: Secondary | ICD-10-CM | POA: Diagnosis not present

## 2013-04-01 DIAGNOSIS — E1149 Type 2 diabetes mellitus with other diabetic neurological complication: Secondary | ICD-10-CM | POA: Diagnosis not present

## 2013-04-01 DIAGNOSIS — E782 Mixed hyperlipidemia: Secondary | ICD-10-CM | POA: Diagnosis not present

## 2013-04-01 DIAGNOSIS — I10 Essential (primary) hypertension: Secondary | ICD-10-CM | POA: Diagnosis not present

## 2013-04-01 DIAGNOSIS — E1142 Type 2 diabetes mellitus with diabetic polyneuropathy: Secondary | ICD-10-CM | POA: Diagnosis not present

## 2013-04-05 ENCOUNTER — Other Ambulatory Visit (HOSPITAL_COMMUNITY): Payer: Self-pay | Admitting: Family Medicine

## 2013-04-05 DIAGNOSIS — Z1231 Encounter for screening mammogram for malignant neoplasm of breast: Secondary | ICD-10-CM

## 2013-04-10 ENCOUNTER — Ambulatory Visit (HOSPITAL_COMMUNITY)
Admission: RE | Admit: 2013-04-10 | Discharge: 2013-04-10 | Disposition: A | Discharge status: Home / Self Care | Payer: Medicare Other | Source: Ambulatory Visit | Attending: Family Medicine | Admitting: Family Medicine

## 2013-04-10 DIAGNOSIS — Z1231 Encounter for screening mammogram for malignant neoplasm of breast: Secondary | ICD-10-CM | POA: Insufficient documentation

## 2013-06-17 DIAGNOSIS — E119 Type 2 diabetes mellitus without complications: Secondary | ICD-10-CM | POA: Diagnosis not present

## 2013-06-17 DIAGNOSIS — H2510 Age-related nuclear cataract, unspecified eye: Secondary | ICD-10-CM | POA: Diagnosis not present

## 2013-07-23 DIAGNOSIS — L821 Other seborrheic keratosis: Secondary | ICD-10-CM | POA: Diagnosis not present

## 2013-07-23 DIAGNOSIS — Z85828 Personal history of other malignant neoplasm of skin: Secondary | ICD-10-CM | POA: Diagnosis not present

## 2013-10-09 DIAGNOSIS — I10 Essential (primary) hypertension: Secondary | ICD-10-CM | POA: Diagnosis not present

## 2013-10-09 DIAGNOSIS — Z23 Encounter for immunization: Secondary | ICD-10-CM | POA: Diagnosis not present

## 2013-10-09 DIAGNOSIS — E1142 Type 2 diabetes mellitus with diabetic polyneuropathy: Secondary | ICD-10-CM | POA: Diagnosis not present

## 2013-10-09 DIAGNOSIS — E11319 Type 2 diabetes mellitus with unspecified diabetic retinopathy without macular edema: Secondary | ICD-10-CM | POA: Diagnosis not present

## 2013-10-09 DIAGNOSIS — E782 Mixed hyperlipidemia: Secondary | ICD-10-CM | POA: Diagnosis not present

## 2013-10-09 DIAGNOSIS — F411 Generalized anxiety disorder: Secondary | ICD-10-CM | POA: Diagnosis not present

## 2013-10-09 DIAGNOSIS — E1149 Type 2 diabetes mellitus with other diabetic neurological complication: Secondary | ICD-10-CM | POA: Diagnosis not present

## 2013-10-09 DIAGNOSIS — R300 Dysuria: Secondary | ICD-10-CM | POA: Diagnosis not present

## 2013-11-28 ENCOUNTER — Encounter: Payer: Self-pay | Admitting: General Surgery

## 2014-01-03 DIAGNOSIS — Z23 Encounter for immunization: Secondary | ICD-10-CM | POA: Diagnosis not present

## 2014-01-03 DIAGNOSIS — Z0000 Encounter for general adult medical examination without abnormal findings: Secondary | ICD-10-CM | POA: Diagnosis not present

## 2014-01-03 DIAGNOSIS — M899 Disorder of bone, unspecified: Secondary | ICD-10-CM | POA: Diagnosis not present

## 2014-01-03 DIAGNOSIS — M949 Disorder of cartilage, unspecified: Secondary | ICD-10-CM | POA: Diagnosis not present

## 2014-01-03 DIAGNOSIS — I10 Essential (primary) hypertension: Secondary | ICD-10-CM | POA: Diagnosis not present

## 2014-01-03 DIAGNOSIS — R252 Cramp and spasm: Secondary | ICD-10-CM | POA: Diagnosis not present

## 2014-02-13 DIAGNOSIS — M899 Disorder of bone, unspecified: Secondary | ICD-10-CM | POA: Diagnosis not present

## 2014-04-17 ENCOUNTER — Other Ambulatory Visit (HOSPITAL_COMMUNITY): Payer: Self-pay | Admitting: Family Medicine

## 2014-04-17 DIAGNOSIS — E782 Mixed hyperlipidemia: Secondary | ICD-10-CM | POA: Diagnosis not present

## 2014-04-17 DIAGNOSIS — I10 Essential (primary) hypertension: Secondary | ICD-10-CM | POA: Diagnosis not present

## 2014-04-17 DIAGNOSIS — Z1231 Encounter for screening mammogram for malignant neoplasm of breast: Secondary | ICD-10-CM

## 2014-04-17 DIAGNOSIS — E1149 Type 2 diabetes mellitus with other diabetic neurological complication: Secondary | ICD-10-CM | POA: Diagnosis not present

## 2014-04-17 DIAGNOSIS — E1142 Type 2 diabetes mellitus with diabetic polyneuropathy: Secondary | ICD-10-CM | POA: Diagnosis not present

## 2014-05-06 ENCOUNTER — Ambulatory Visit (HOSPITAL_COMMUNITY)
Admission: RE | Admit: 2014-05-06 | Discharge: 2014-05-06 | Disposition: A | Discharge status: Home / Self Care | Payer: Medicare Other | Source: Ambulatory Visit | Attending: Family Medicine | Admitting: Family Medicine

## 2014-05-06 DIAGNOSIS — Z1231 Encounter for screening mammogram for malignant neoplasm of breast: Secondary | ICD-10-CM | POA: Diagnosis not present

## 2014-06-17 ENCOUNTER — Ambulatory Visit: Payer: Medicare Other | Admitting: Neurology

## 2014-06-18 DIAGNOSIS — H2510 Age-related nuclear cataract, unspecified eye: Secondary | ICD-10-CM | POA: Diagnosis not present

## 2014-06-18 DIAGNOSIS — E119 Type 2 diabetes mellitus without complications: Secondary | ICD-10-CM | POA: Diagnosis not present

## 2014-07-22 DIAGNOSIS — L819 Disorder of pigmentation, unspecified: Secondary | ICD-10-CM | POA: Diagnosis not present

## 2014-07-22 DIAGNOSIS — Z85828 Personal history of other malignant neoplasm of skin: Secondary | ICD-10-CM | POA: Diagnosis not present

## 2014-07-22 DIAGNOSIS — L570 Actinic keratosis: Secondary | ICD-10-CM | POA: Diagnosis not present

## 2014-07-22 DIAGNOSIS — L908 Other atrophic disorders of skin: Secondary | ICD-10-CM | POA: Diagnosis not present

## 2014-07-22 DIAGNOSIS — L821 Other seborrheic keratosis: Secondary | ICD-10-CM | POA: Diagnosis not present

## 2014-07-22 DIAGNOSIS — D692 Other nonthrombocytopenic purpura: Secondary | ICD-10-CM | POA: Diagnosis not present

## 2014-09-10 ENCOUNTER — Encounter: Payer: Self-pay | Admitting: Neurology

## 2014-09-16 ENCOUNTER — Encounter: Payer: Self-pay | Admitting: Neurology

## 2014-09-23 ENCOUNTER — Other Ambulatory Visit: Payer: Self-pay | Admitting: Dermatology

## 2014-09-23 DIAGNOSIS — L570 Actinic keratosis: Secondary | ICD-10-CM | POA: Diagnosis not present

## 2014-09-23 DIAGNOSIS — Z85828 Personal history of other malignant neoplasm of skin: Secondary | ICD-10-CM | POA: Diagnosis not present

## 2014-09-23 DIAGNOSIS — C44622 Squamous cell carcinoma of skin of right upper limb, including shoulder: Secondary | ICD-10-CM | POA: Diagnosis not present

## 2014-09-23 DIAGNOSIS — D485 Neoplasm of uncertain behavior of skin: Secondary | ICD-10-CM | POA: Diagnosis not present

## 2014-10-22 DIAGNOSIS — E1140 Type 2 diabetes mellitus with diabetic neuropathy, unspecified: Secondary | ICD-10-CM | POA: Diagnosis not present

## 2014-10-22 DIAGNOSIS — E782 Mixed hyperlipidemia: Secondary | ICD-10-CM | POA: Diagnosis not present

## 2014-10-22 DIAGNOSIS — F419 Anxiety disorder, unspecified: Secondary | ICD-10-CM | POA: Diagnosis not present

## 2014-10-22 DIAGNOSIS — I10 Essential (primary) hypertension: Secondary | ICD-10-CM | POA: Diagnosis not present

## 2015-01-15 DIAGNOSIS — E782 Mixed hyperlipidemia: Secondary | ICD-10-CM | POA: Diagnosis not present

## 2015-01-15 DIAGNOSIS — I10 Essential (primary) hypertension: Secondary | ICD-10-CM | POA: Diagnosis not present

## 2015-01-15 DIAGNOSIS — Z0000 Encounter for general adult medical examination without abnormal findings: Secondary | ICD-10-CM | POA: Diagnosis not present

## 2015-01-15 DIAGNOSIS — E1140 Type 2 diabetes mellitus with diabetic neuropathy, unspecified: Secondary | ICD-10-CM | POA: Diagnosis not present

## 2015-01-15 DIAGNOSIS — E1165 Type 2 diabetes mellitus with hyperglycemia: Secondary | ICD-10-CM | POA: Diagnosis not present

## 2015-01-15 DIAGNOSIS — Z800 Family history of malignant neoplasm of digestive organs: Secondary | ICD-10-CM | POA: Diagnosis not present

## 2015-02-06 DIAGNOSIS — J219 Acute bronchiolitis, unspecified: Secondary | ICD-10-CM | POA: Diagnosis not present

## 2015-02-06 DIAGNOSIS — R05 Cough: Secondary | ICD-10-CM | POA: Diagnosis not present

## 2015-02-06 DIAGNOSIS — R062 Wheezing: Secondary | ICD-10-CM | POA: Diagnosis not present

## 2015-03-10 ENCOUNTER — Encounter: Payer: Self-pay | Admitting: Internal Medicine

## 2015-03-30 DIAGNOSIS — Z85828 Personal history of other malignant neoplasm of skin: Secondary | ICD-10-CM | POA: Diagnosis not present

## 2015-03-30 DIAGNOSIS — D1801 Hemangioma of skin and subcutaneous tissue: Secondary | ICD-10-CM | POA: Diagnosis not present

## 2015-03-30 DIAGNOSIS — L821 Other seborrheic keratosis: Secondary | ICD-10-CM | POA: Diagnosis not present

## 2015-03-30 DIAGNOSIS — L812 Freckles: Secondary | ICD-10-CM | POA: Diagnosis not present

## 2015-04-09 ENCOUNTER — Other Ambulatory Visit (HOSPITAL_COMMUNITY): Payer: Self-pay | Admitting: Family Medicine

## 2015-04-09 DIAGNOSIS — Z1231 Encounter for screening mammogram for malignant neoplasm of breast: Secondary | ICD-10-CM

## 2015-05-04 ENCOUNTER — Ambulatory Visit (AMBULATORY_SURGERY_CENTER): Payer: Medicare Other

## 2015-05-04 ENCOUNTER — Encounter: Payer: Self-pay | Admitting: Internal Medicine

## 2015-05-04 VITALS — Ht 65.0 in | Wt 186.0 lb

## 2015-05-04 DIAGNOSIS — Z800 Family history of malignant neoplasm of digestive organs: Secondary | ICD-10-CM

## 2015-05-04 MED ORDER — NA SULFATE-K SULFATE-MG SULF 17.5-3.13-1.6 GM/177ML PO SOLN: 1.0000 | Freq: Once | ORAL | Status: DC

## 2015-05-04 NOTE — Progress Notes (Signed)
Not on home 02 Pt not taking diet drugs No egg or soy allergies No hx of anesthesia complications

## 2015-05-18 ENCOUNTER — Encounter: Payer: BLUE CROSS/BLUE SHIELD | Admitting: Internal Medicine

## 2015-05-25 DIAGNOSIS — H52203 Unspecified astigmatism, bilateral: Secondary | ICD-10-CM | POA: Diagnosis not present

## 2015-05-25 DIAGNOSIS — H2513 Age-related nuclear cataract, bilateral: Secondary | ICD-10-CM | POA: Diagnosis not present

## 2015-05-25 DIAGNOSIS — E119 Type 2 diabetes mellitus without complications: Secondary | ICD-10-CM | POA: Diagnosis not present

## 2015-05-26 ENCOUNTER — Ambulatory Visit (HOSPITAL_COMMUNITY)
Admission: RE | Admit: 2015-05-26 | Discharge: 2015-05-26 | Disposition: A | Discharge status: Home / Self Care | Payer: Medicare Other | Source: Ambulatory Visit | Attending: Family Medicine | Admitting: Family Medicine

## 2015-05-26 DIAGNOSIS — Z1231 Encounter for screening mammogram for malignant neoplasm of breast: Secondary | ICD-10-CM | POA: Diagnosis not present

## 2015-07-21 ENCOUNTER — Encounter: Payer: Self-pay | Admitting: Internal Medicine

## 2015-07-21 ENCOUNTER — Ambulatory Visit (AMBULATORY_SURGERY_CENTER): Payer: Medicare Other | Admitting: Internal Medicine

## 2015-07-21 VITALS — BP 127/100 | HR 62 | Temp 95.9°F | Resp 25

## 2015-07-21 DIAGNOSIS — D122 Benign neoplasm of ascending colon: Secondary | ICD-10-CM

## 2015-07-21 DIAGNOSIS — I10 Essential (primary) hypertension: Secondary | ICD-10-CM | POA: Diagnosis not present

## 2015-07-21 DIAGNOSIS — Z800 Family history of malignant neoplasm of digestive organs: Secondary | ICD-10-CM | POA: Diagnosis not present

## 2015-07-21 DIAGNOSIS — K219 Gastro-esophageal reflux disease without esophagitis: Secondary | ICD-10-CM | POA: Diagnosis not present

## 2015-07-21 DIAGNOSIS — Z86010 Personal history of colonic polyps: Secondary | ICD-10-CM | POA: Diagnosis not present

## 2015-07-21 DIAGNOSIS — E119 Type 2 diabetes mellitus without complications: Secondary | ICD-10-CM | POA: Diagnosis not present

## 2015-07-21 DIAGNOSIS — D123 Benign neoplasm of transverse colon: Secondary | ICD-10-CM | POA: Diagnosis not present

## 2015-07-21 LAB — GLUCOSE, CAPILLARY
GLUCOSE-CAPILLARY: 135 mg/dL — AB (ref 65–99)
Glucose-Capillary: 125 mg/dL — ABNORMAL HIGH (ref 65–99)

## 2015-07-21 MED ORDER — SODIUM CHLORIDE 0.9 % IV SOLN: 500.0000 mL | INTRAVENOUS | Status: DC

## 2015-07-21 NOTE — Progress Notes (Signed)
Report to PACU, RN, vss, BBS= Clear.  

## 2015-07-21 NOTE — Progress Notes (Signed)
Called to room to assist during endoscopic procedure.  Patient ID and intended procedure confirmed with present staff. Received instructions for my participation in the procedure from the performing physician.  

## 2015-07-21 NOTE — Op Note (Signed)
[place]  [address] [place], [id number]   COLONOSCOPY PROCEDURE REPORT  PATIENT: Danielle, [name]  MR#: [id number] BIRTHDATE: February 09, 1935 , 80  yrs. old GENDER: female ENDOSCOPIST: Eustace Quail, MD REFERRED [id number] Program Recall PROCEDURE DATE:  07/21/2015 PROCEDURE:   Colonoscopy, surveillance and Colonoscopy with snare polypectomy x 2 First Screening Colonoscopy - Avg.  risk and is 50 yrs.  old or older - No.  Prior Negative Screening - Now for repeat screening. N/A  History of Adenoma - Now for follow-up colonoscopy & has been > or = to 3 yrs.  Yes hx of adenoma.  Has been 3 or more years since last colonoscopy.  Polyps removed today? Yes ASA CLASS:   Class II INDICATIONS:Surveillance due to prior colonic neoplasia, PH Colon Adenoma, and FH Colon or Rectal Adenocarcinoma. Patient with prior colonoscopies 1997, 2002, 2007. Mother in 84s; sister around 28 w/ colon cancer. MEDICATIONS: Monitored anesthesia care and Propofol 200 mg IV  DESCRIPTION OF PROCEDURE:   After the risks benefits and alternatives of the procedure were thoroughly explained, informed consent was obtained.  The digital rectal exam revealed no abnormalities of the rectum.   The LB [id number]  endoscope was introduced through the anus and advanced to the cecum, which was identified by both the appendix and ileocecal valve. No adverse events experienced.   The quality of the prep was excellent. (Suprep was used)  The instrument was then slowly withdrawn as the colon was fully examined. Estimated blood loss is zero unless otherwise noted in this procedure report.  COLON FINDINGS: Two polyps ranging between 3-81mm in size were found in the transverse colon and ascending colon.  A polypectomy was performed with a cold snare.  The resection was complete, the polyp tissue was completely retrieved and sent to histology.   There was moderate diverticulosis noted in the right  colon and left colon. The examination was otherwise normal.  Retroflexed views revealed internal hemorrhoids. The time to cecum = 4.2 Withdrawal time = 11.8   The scope was withdrawn and the procedure completed. COMPLICATIONS: There were no immediate complications.  ENDOSCOPIC IMPRESSION: 1.   Two polyps were found in the transverse colon and ascending colon; polypectomy was performed with a cold snare 2.   Moderate diverticulosis was noted in the right colon and left colon 3.   The examination was otherwise normal  RECOMMENDATIONS: 1. Follow up colonoscopy in 5 years , to be considered if medically fit and willing  eSigned:  Eustace Quail, MD 07/21/2015 11:16 AM   cc: The Patient and Darcus Austin, MD

## 2015-07-21 NOTE — Patient Instructions (Signed)
YOU HAD AN ENDOSCOPIC PROCEDURE TODAY AT THE [facility]:   Refer to the procedure report that was given to you for any specific questions about what was found during the examination.  If the procedure report does not answer your questions, please call your gastroenterologist to clarify.  If you requested that your care partner not be given the details of your procedure findings, then the procedure report has been included in a sealed envelope for you to review at your convenience later.  YOU SHOULD EXPECT: Some feelings of bloating in the abdomen. Passage of more gas than usual.  Walking can help get rid of the air that was put into your GI tract during the procedure and reduce the bloating. If you had a lower endoscopy (such as a colonoscopy or flexible sigmoidoscopy) you may notice spotting of blood in your stool or on the toilet paper. If you underwent a bowel prep for your procedure, you may not have a normal bowel movement for a few days.  Please Note:  You might notice some irritation and congestion in your nose or some drainage.  This is from the oxygen used during your procedure.  There is no need for concern and it should clear up in a day or so.  SYMPTOMS TO REPORT IMMEDIATELY:   Following lower endoscopy (colonoscopy or flexible sigmoidoscopy):  Excessive amounts of blood in the stool  Significant tenderness or worsening of abdominal pains  Swelling of the abdomen that is new, acute  Fever of 100F or higher    For urgent or emergent issues, a gastroenterologist can be reached at any hour by calling [phone].   DIET: Your first meal following the procedure should be a small meal and then it is ok to progress to your normal diet. Heavy or fried foods are harder to digest and may make you feel nauseous or bloated.  Likewise, meals heavy in dairy and vegetables can increase bloating.  Drink plenty of fluids but you should avoid alcoholic beverages for 24  hours.  ACTIVITY:  You should plan to take it easy for the rest of today and you should NOT DRIVE or use heavy machinery until tomorrow (because of the sedation medicines used during the test).    FOLLOW UP: Our staff will call the number listed on your records the next business day following your procedure to check on you and address any questions or concerns that you may have regarding the information given to you following your procedure. If we do not reach you, we will leave a message.  However, if you are feeling well and you are not experiencing any problems, there is no need to return our call.  We will assume that you have returned to your regular daily activities without incident.  If any biopsies were taken you will be contacted by phone or by letter within the next 1-3 weeks.  Please call us at [phone] if you have not heard about the biopsies in 3 weeks.    SIGNATURES/CONFIDENTIALITY: You and/or your care partner have signed paperwork which will be entered into your electronic medical record.  These signatures attest to the fact that that the information above on your After Visit Summary has been reviewed and is understood.  Full responsibility of the confidentiality of this discharge information lies with you and/or your care-partner.   Information on polyps,diverticulosis ,and high fiber diet given to you today 

## 2015-07-22 ENCOUNTER — Telehealth: Payer: Self-pay

## 2015-07-22 DIAGNOSIS — Z23 Encounter for immunization: Secondary | ICD-10-CM | POA: Diagnosis not present

## 2015-07-22 DIAGNOSIS — E782 Mixed hyperlipidemia: Secondary | ICD-10-CM | POA: Diagnosis not present

## 2015-07-22 DIAGNOSIS — I10 Essential (primary) hypertension: Secondary | ICD-10-CM | POA: Diagnosis not present

## 2015-07-22 DIAGNOSIS — E1140 Type 2 diabetes mellitus with diabetic neuropathy, unspecified: Secondary | ICD-10-CM | POA: Diagnosis not present

## 2015-07-22 DIAGNOSIS — F419 Anxiety disorder, unspecified: Secondary | ICD-10-CM | POA: Diagnosis not present

## 2015-07-22 DIAGNOSIS — Z794 Long term (current) use of insulin: Secondary | ICD-10-CM | POA: Diagnosis not present

## 2015-07-22 NOTE — Telephone Encounter (Signed)
  Follow up Call-  Call back number 07/21/2015  Post procedure Call Back phone  # [phone]  Permission to leave phone message Yes     Patient questions:  Do you have a fever, pain , or abdominal swelling? No. Pain Score  0 *  Have you tolerated food without any problems? Yes.    Have you been able to return to your normal activities? Yes.    Do you have any questions about your discharge instructions: Diet   No. Medications  No. Follow up visit  No.  Do you have questions or concerns about your Care? No.  Actions: * If pain score is 4 or above: No action needed, pain <4.

## 2015-07-29 ENCOUNTER — Encounter: Payer: Self-pay | Admitting: Internal Medicine

## 2015-11-11 DIAGNOSIS — J029 Acute pharyngitis, unspecified: Secondary | ICD-10-CM | POA: Diagnosis not present

## 2016-01-27 DIAGNOSIS — I10 Essential (primary) hypertension: Secondary | ICD-10-CM | POA: Diagnosis not present

## 2016-01-27 DIAGNOSIS — F419 Anxiety disorder, unspecified: Secondary | ICD-10-CM | POA: Diagnosis not present

## 2016-01-27 DIAGNOSIS — E1165 Type 2 diabetes mellitus with hyperglycemia: Secondary | ICD-10-CM | POA: Diagnosis not present

## 2016-01-27 DIAGNOSIS — Z0000 Encounter for general adult medical examination without abnormal findings: Secondary | ICD-10-CM | POA: Diagnosis not present

## 2016-01-27 DIAGNOSIS — Z794 Long term (current) use of insulin: Secondary | ICD-10-CM | POA: Diagnosis not present

## 2016-01-27 DIAGNOSIS — E782 Mixed hyperlipidemia: Secondary | ICD-10-CM | POA: Diagnosis not present

## 2016-01-27 DIAGNOSIS — E1140 Type 2 diabetes mellitus with diabetic neuropathy, unspecified: Secondary | ICD-10-CM | POA: Diagnosis not present

## 2016-03-29 DIAGNOSIS — L812 Freckles: Secondary | ICD-10-CM | POA: Diagnosis not present

## 2016-03-29 DIAGNOSIS — Z85828 Personal history of other malignant neoplasm of skin: Secondary | ICD-10-CM | POA: Diagnosis not present

## 2016-03-29 DIAGNOSIS — L821 Other seborrheic keratosis: Secondary | ICD-10-CM | POA: Diagnosis not present

## 2016-03-29 DIAGNOSIS — L570 Actinic keratosis: Secondary | ICD-10-CM | POA: Diagnosis not present

## 2016-06-20 DIAGNOSIS — Z0100 Encounter for examination of eyes and vision without abnormal findings: Secondary | ICD-10-CM | POA: Diagnosis not present

## 2016-06-20 DIAGNOSIS — E119 Type 2 diabetes mellitus without complications: Secondary | ICD-10-CM | POA: Diagnosis not present

## 2016-06-20 DIAGNOSIS — H2513 Age-related nuclear cataract, bilateral: Secondary | ICD-10-CM | POA: Diagnosis not present

## 2016-06-30 DIAGNOSIS — R109 Unspecified abdominal pain: Secondary | ICD-10-CM | POA: Diagnosis not present

## 2016-06-30 DIAGNOSIS — F419 Anxiety disorder, unspecified: Secondary | ICD-10-CM | POA: Diagnosis not present

## 2016-08-03 DIAGNOSIS — I10 Essential (primary) hypertension: Secondary | ICD-10-CM | POA: Diagnosis not present

## 2016-08-03 DIAGNOSIS — Z23 Encounter for immunization: Secondary | ICD-10-CM | POA: Diagnosis not present

## 2016-08-03 DIAGNOSIS — E782 Mixed hyperlipidemia: Secondary | ICD-10-CM | POA: Diagnosis not present

## 2016-08-03 DIAGNOSIS — Z794 Long term (current) use of insulin: Secondary | ICD-10-CM | POA: Diagnosis not present

## 2016-08-03 DIAGNOSIS — E1140 Type 2 diabetes mellitus with diabetic neuropathy, unspecified: Secondary | ICD-10-CM | POA: Diagnosis not present

## 2016-09-22 IMAGING — MG MM SCREENING BREAST TOMO BILATERAL
8 series · 8 of 24 positions shown · non-contrast
Comparison: Previous exam(s).

CLINICAL DATA: Screening.

EXAM:
DIGITAL SCREENING BILATERAL MAMMOGRAM WITH 3D TOMO WITH CAD

[L MLO]
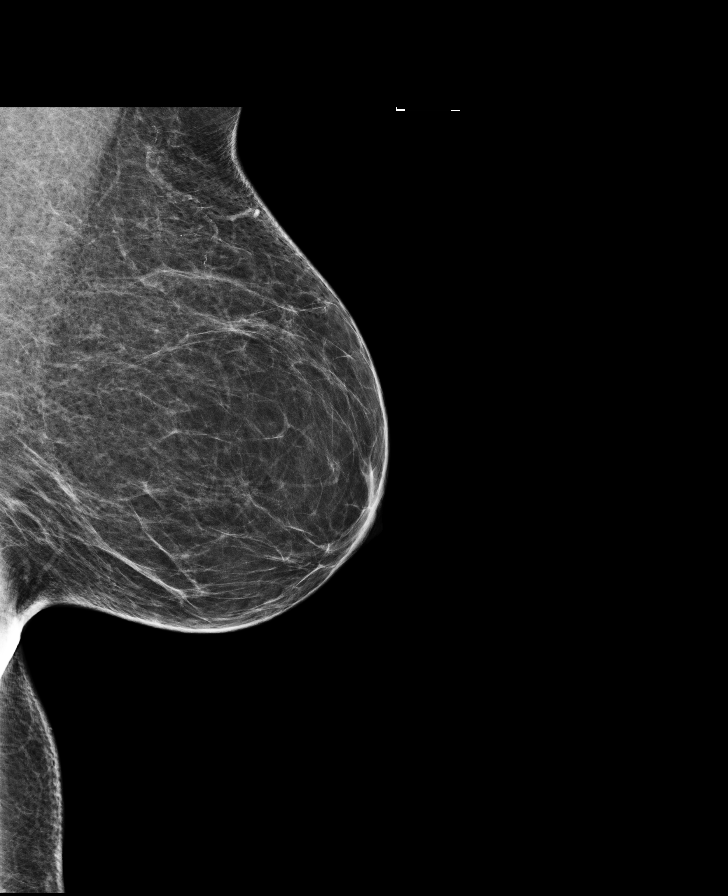

[L CC]
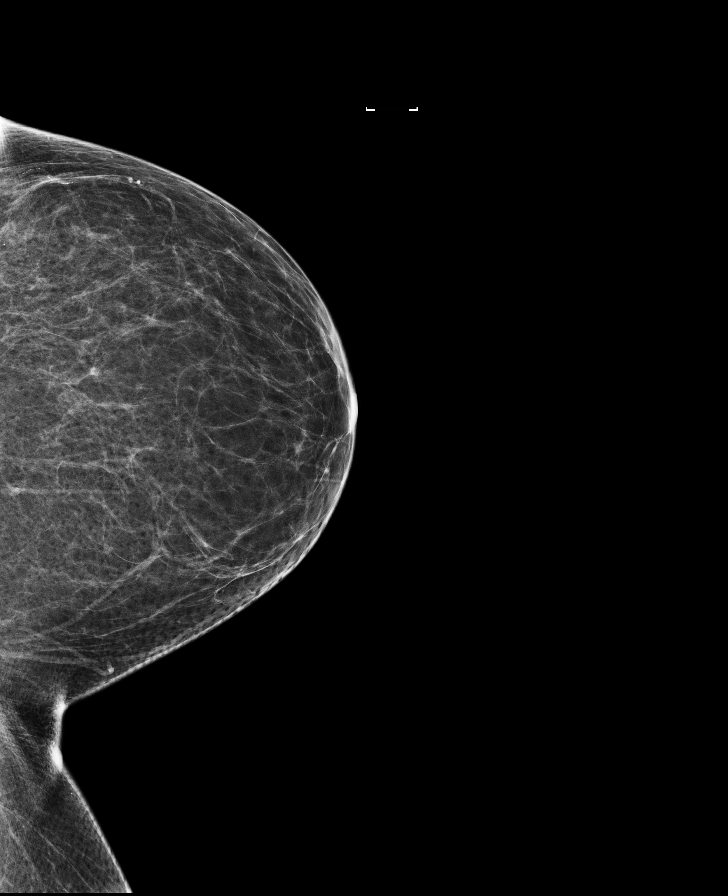

[R MLO]
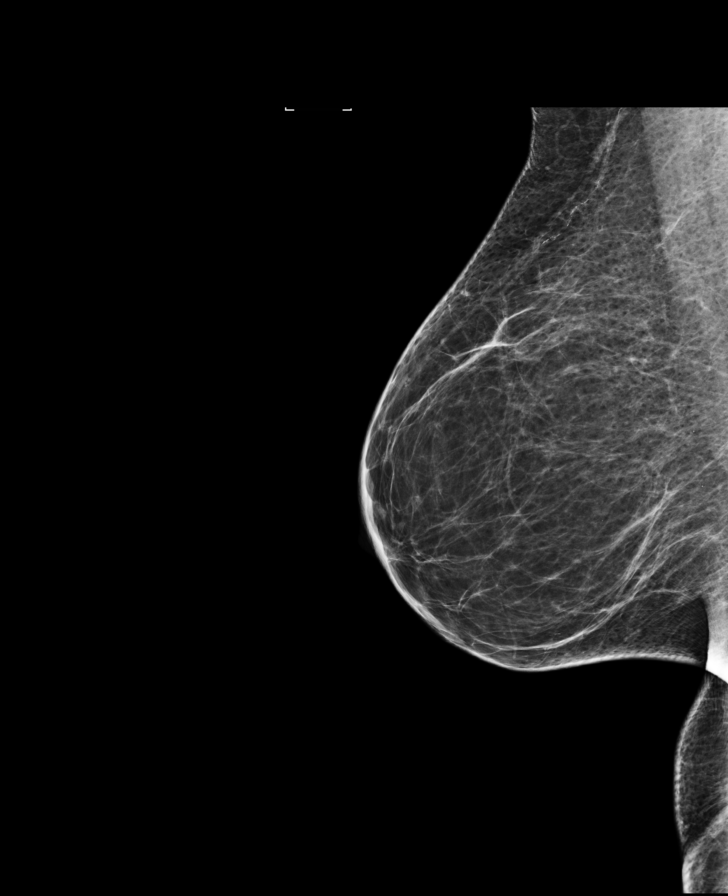

[R CC]
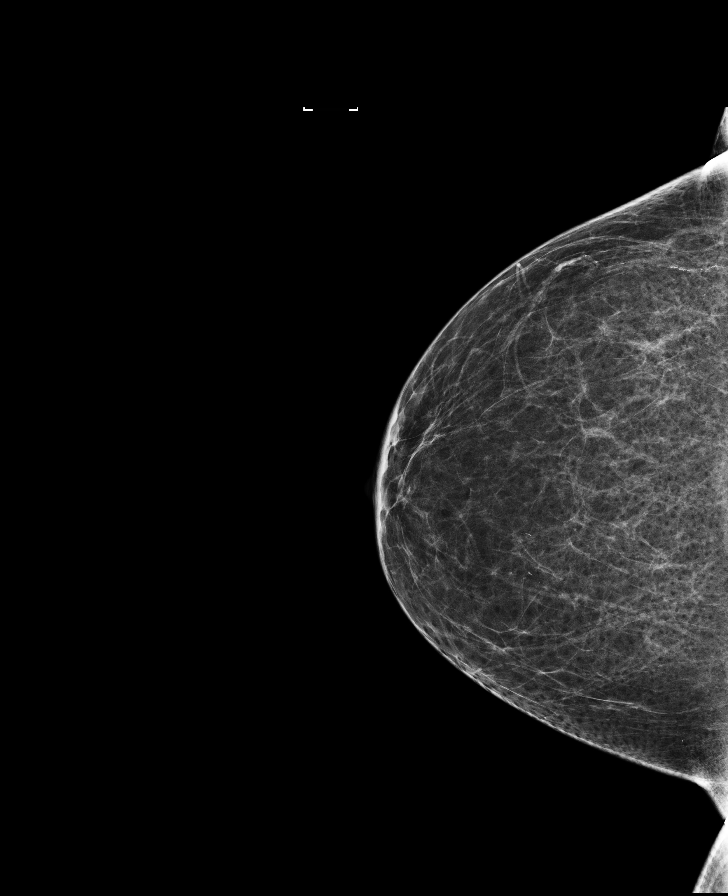

[L CC tomo · tomo slice 35/70.0]
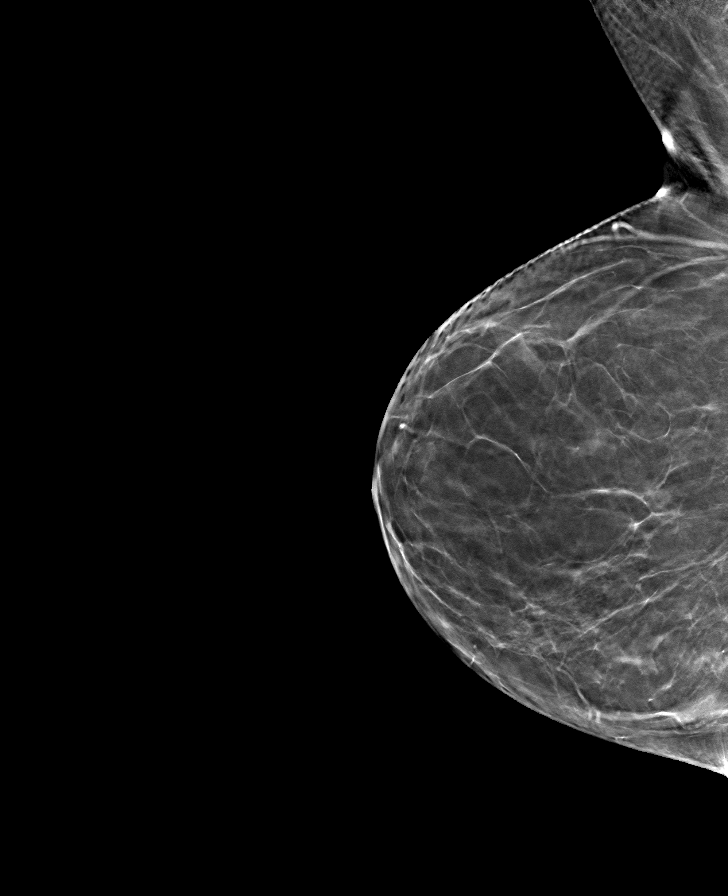

[R MLO tomo · tomo slice 41/82.0]
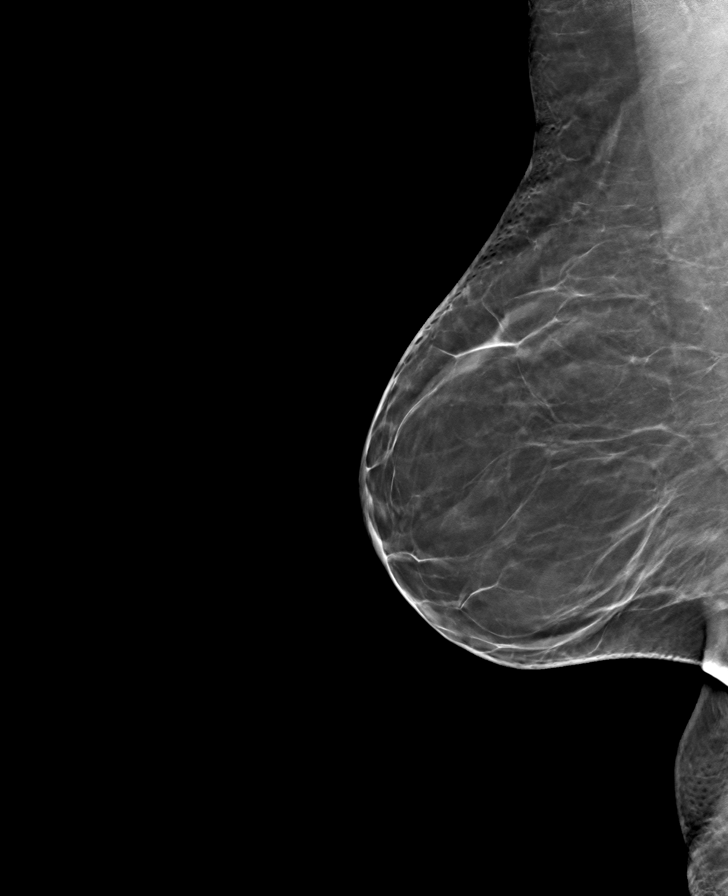

[L MLO tomo · tomo slice 41/81.0]
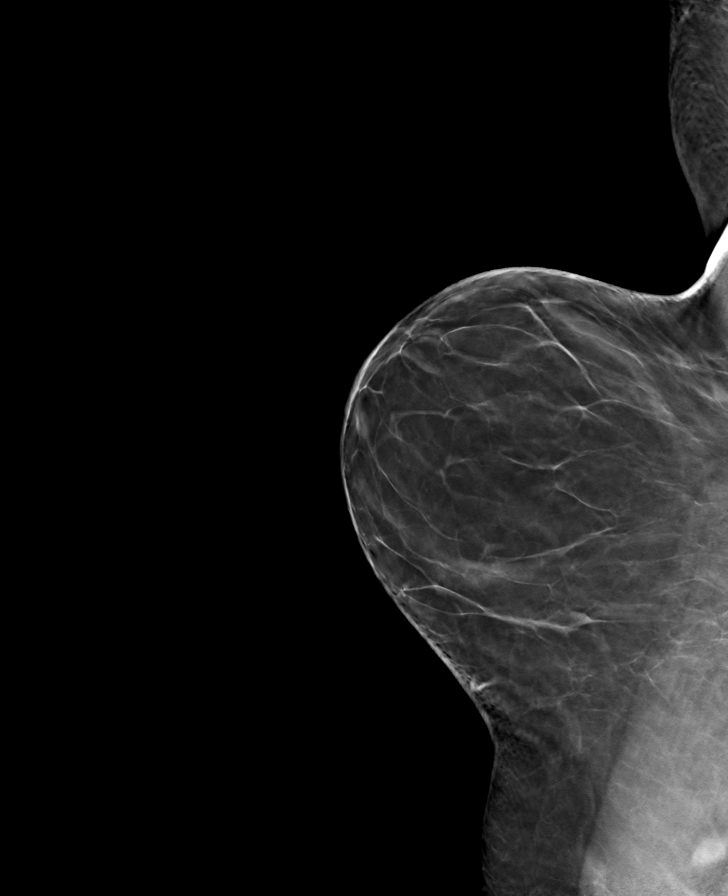

[R CC tomo · tomo slice 35/70.0]
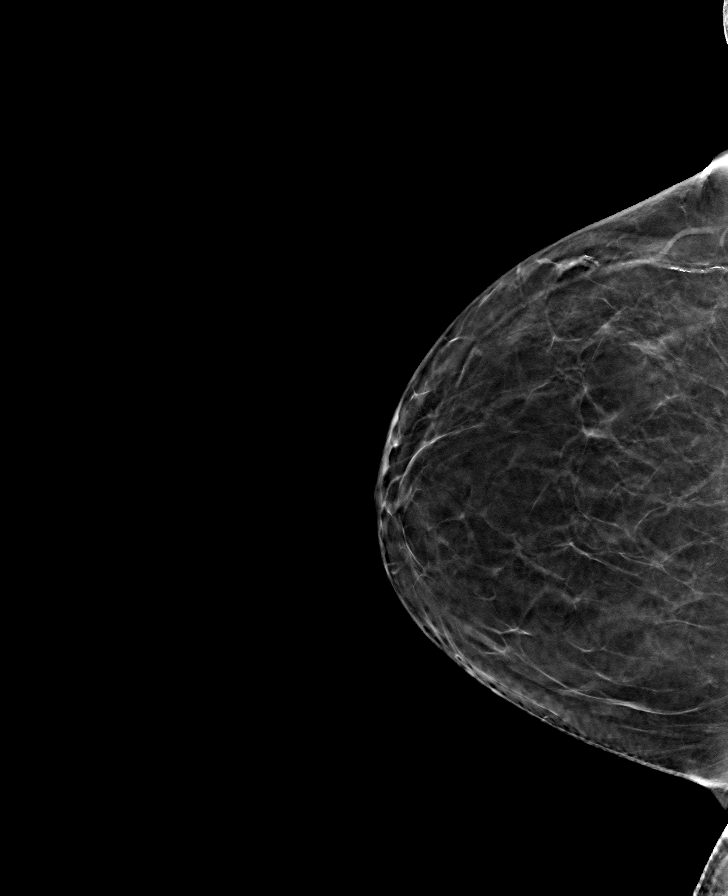

[8 of 24 positions shown; findings below may reference images not displayed]

ACR Breast Density Category b: There are scattered areas of
fibroglandular density.
FINDINGS: There are no findings suspicious for malignancy. Images were
processed with CAD.
IMPRESSION: No mammographic evidence of malignancy. A result letter of this
screening mammogram will be mailed directly to the patient.

RECOMMENDATION:
Screening mammogram in one year. (Code:55-L-23V)

BI-RADS CATEGORY  1: Negative.

## 2017-01-30 ENCOUNTER — Other Ambulatory Visit: Payer: Self-pay | Admitting: Family Medicine

## 2017-01-30 DIAGNOSIS — Z1231 Encounter for screening mammogram for malignant neoplasm of breast: Secondary | ICD-10-CM

## 2017-02-02 DIAGNOSIS — Z794 Long term (current) use of insulin: Secondary | ICD-10-CM | POA: Diagnosis not present

## 2017-02-02 DIAGNOSIS — Z0000 Encounter for general adult medical examination without abnormal findings: Secondary | ICD-10-CM | POA: Diagnosis not present

## 2017-02-02 DIAGNOSIS — M8580 Other specified disorders of bone density and structure, unspecified site: Secondary | ICD-10-CM | POA: Diagnosis not present

## 2017-02-02 DIAGNOSIS — F419 Anxiety disorder, unspecified: Secondary | ICD-10-CM | POA: Diagnosis not present

## 2017-02-02 DIAGNOSIS — I10 Essential (primary) hypertension: Secondary | ICD-10-CM | POA: Diagnosis not present

## 2017-02-02 DIAGNOSIS — M79675 Pain in left toe(s): Secondary | ICD-10-CM | POA: Diagnosis not present

## 2017-02-02 DIAGNOSIS — E1140 Type 2 diabetes mellitus with diabetic neuropathy, unspecified: Secondary | ICD-10-CM | POA: Diagnosis not present

## 2017-02-02 DIAGNOSIS — E782 Mixed hyperlipidemia: Secondary | ICD-10-CM | POA: Diagnosis not present

## 2017-02-17 ENCOUNTER — Ambulatory Visit: Payer: BLUE CROSS/BLUE SHIELD

## 2017-03-07 ENCOUNTER — Ambulatory Visit
Admission: RE | Admit: 2017-03-07 | Discharge: 2017-03-07 | Disposition: A | Discharge status: Home / Self Care | Payer: Medicare Other | Source: Ambulatory Visit | Attending: Family Medicine | Admitting: Family Medicine

## 2017-03-07 DIAGNOSIS — Z1231 Encounter for screening mammogram for malignant neoplasm of breast: Secondary | ICD-10-CM | POA: Diagnosis not present

## 2017-04-03 DIAGNOSIS — L814 Other melanin hyperpigmentation: Secondary | ICD-10-CM | POA: Diagnosis not present

## 2017-04-03 DIAGNOSIS — L821 Other seborrheic keratosis: Secondary | ICD-10-CM | POA: Diagnosis not present

## 2017-04-03 DIAGNOSIS — D692 Other nonthrombocytopenic purpura: Secondary | ICD-10-CM | POA: Diagnosis not present

## 2017-04-03 DIAGNOSIS — D225 Melanocytic nevi of trunk: Secondary | ICD-10-CM | POA: Diagnosis not present

## 2017-04-03 DIAGNOSIS — Z85828 Personal history of other malignant neoplasm of skin: Secondary | ICD-10-CM | POA: Diagnosis not present

## 2017-04-17 DIAGNOSIS — E113211 Type 2 diabetes mellitus with mild nonproliferative diabetic retinopathy with macular edema, right eye: Secondary | ICD-10-CM | POA: Diagnosis not present

## 2017-04-17 DIAGNOSIS — H5203 Hypermetropia, bilateral: Secondary | ICD-10-CM | POA: Diagnosis not present

## 2017-04-25 DIAGNOSIS — E113291 Type 2 diabetes mellitus with mild nonproliferative diabetic retinopathy without macular edema, right eye: Secondary | ICD-10-CM | POA: Diagnosis not present

## 2017-04-25 DIAGNOSIS — E113292 Type 2 diabetes mellitus with mild nonproliferative diabetic retinopathy without macular edema, left eye: Secondary | ICD-10-CM | POA: Diagnosis not present

## 2017-07-05 DIAGNOSIS — H2513 Age-related nuclear cataract, bilateral: Secondary | ICD-10-CM | POA: Diagnosis not present

## 2017-07-05 DIAGNOSIS — E113292 Type 2 diabetes mellitus with mild nonproliferative diabetic retinopathy without macular edema, left eye: Secondary | ICD-10-CM | POA: Diagnosis not present

## 2017-07-25 DIAGNOSIS — H25811 Combined forms of age-related cataract, right eye: Secondary | ICD-10-CM | POA: Diagnosis not present

## 2017-07-25 DIAGNOSIS — H2511 Age-related nuclear cataract, right eye: Secondary | ICD-10-CM | POA: Diagnosis not present

## 2017-08-07 DIAGNOSIS — E1140 Type 2 diabetes mellitus with diabetic neuropathy, unspecified: Secondary | ICD-10-CM | POA: Diagnosis not present

## 2017-08-07 DIAGNOSIS — E1165 Type 2 diabetes mellitus with hyperglycemia: Secondary | ICD-10-CM | POA: Diagnosis not present

## 2017-08-07 DIAGNOSIS — Z794 Long term (current) use of insulin: Secondary | ICD-10-CM | POA: Diagnosis not present

## 2017-08-07 DIAGNOSIS — E782 Mixed hyperlipidemia: Secondary | ICD-10-CM | POA: Diagnosis not present

## 2017-08-07 DIAGNOSIS — Z23 Encounter for immunization: Secondary | ICD-10-CM | POA: Diagnosis not present

## 2017-08-07 DIAGNOSIS — F419 Anxiety disorder, unspecified: Secondary | ICD-10-CM | POA: Diagnosis not present

## 2017-08-07 DIAGNOSIS — I10 Essential (primary) hypertension: Secondary | ICD-10-CM | POA: Diagnosis not present

## 2017-08-09 DIAGNOSIS — H35051 Retinal neovascularization, unspecified, right eye: Secondary | ICD-10-CM | POA: Diagnosis not present

## 2017-08-09 DIAGNOSIS — E113293 Type 2 diabetes mellitus with mild nonproliferative diabetic retinopathy without macular edema, bilateral: Secondary | ICD-10-CM | POA: Diagnosis not present

## 2017-08-16 DIAGNOSIS — E113293 Type 2 diabetes mellitus with mild nonproliferative diabetic retinopathy without macular edema, bilateral: Secondary | ICD-10-CM | POA: Diagnosis not present

## 2017-08-16 DIAGNOSIS — H35051 Retinal neovascularization, unspecified, right eye: Secondary | ICD-10-CM | POA: Diagnosis not present

## 2017-10-19 DIAGNOSIS — H35051 Retinal neovascularization, unspecified, right eye: Secondary | ICD-10-CM | POA: Diagnosis not present

## 2017-10-19 DIAGNOSIS — H43811 Vitreous degeneration, right eye: Secondary | ICD-10-CM | POA: Diagnosis not present

## 2017-10-19 DIAGNOSIS — E113293 Type 2 diabetes mellitus with mild nonproliferative diabetic retinopathy without macular edema, bilateral: Secondary | ICD-10-CM | POA: Diagnosis not present

## 2017-12-11 DIAGNOSIS — H2512 Age-related nuclear cataract, left eye: Secondary | ICD-10-CM | POA: Diagnosis not present

## 2017-12-11 DIAGNOSIS — H25812 Combined forms of age-related cataract, left eye: Secondary | ICD-10-CM | POA: Diagnosis not present

## 2018-02-14 DIAGNOSIS — F419 Anxiety disorder, unspecified: Secondary | ICD-10-CM | POA: Diagnosis not present

## 2018-02-14 DIAGNOSIS — Z0000 Encounter for general adult medical examination without abnormal findings: Secondary | ICD-10-CM | POA: Diagnosis not present

## 2018-02-14 DIAGNOSIS — Z794 Long term (current) use of insulin: Secondary | ICD-10-CM | POA: Diagnosis not present

## 2018-02-14 DIAGNOSIS — E1140 Type 2 diabetes mellitus with diabetic neuropathy, unspecified: Secondary | ICD-10-CM | POA: Diagnosis not present

## 2018-02-14 DIAGNOSIS — B351 Tinea unguium: Secondary | ICD-10-CM | POA: Diagnosis not present

## 2018-02-14 DIAGNOSIS — M8580 Other specified disorders of bone density and structure, unspecified site: Secondary | ICD-10-CM | POA: Diagnosis not present

## 2018-02-14 DIAGNOSIS — E782 Mixed hyperlipidemia: Secondary | ICD-10-CM | POA: Diagnosis not present

## 2018-02-14 DIAGNOSIS — I10 Essential (primary) hypertension: Secondary | ICD-10-CM | POA: Diagnosis not present

## 2018-04-03 DIAGNOSIS — M8588 Other specified disorders of bone density and structure, other site: Secondary | ICD-10-CM | POA: Diagnosis not present

## 2018-04-17 DIAGNOSIS — B351 Tinea unguium: Secondary | ICD-10-CM | POA: Diagnosis not present

## 2018-04-17 DIAGNOSIS — D225 Melanocytic nevi of trunk: Secondary | ICD-10-CM | POA: Diagnosis not present

## 2018-04-17 DIAGNOSIS — Z85828 Personal history of other malignant neoplasm of skin: Secondary | ICD-10-CM | POA: Diagnosis not present

## 2018-04-17 DIAGNOSIS — L821 Other seborrheic keratosis: Secondary | ICD-10-CM | POA: Diagnosis not present

## 2018-04-17 DIAGNOSIS — L812 Freckles: Secondary | ICD-10-CM | POA: Diagnosis not present

## 2018-04-17 DIAGNOSIS — D692 Other nonthrombocytopenic purpura: Secondary | ICD-10-CM | POA: Diagnosis not present

## 2018-04-19 DIAGNOSIS — E113293 Type 2 diabetes mellitus with mild nonproliferative diabetic retinopathy without macular edema, bilateral: Secondary | ICD-10-CM | POA: Diagnosis not present

## 2018-05-23 DIAGNOSIS — H5213 Myopia, bilateral: Secondary | ICD-10-CM | POA: Diagnosis not present

## 2018-05-23 DIAGNOSIS — E113293 Type 2 diabetes mellitus with mild nonproliferative diabetic retinopathy without macular edema, bilateral: Secondary | ICD-10-CM | POA: Diagnosis not present

## 2018-05-23 DIAGNOSIS — H524 Presbyopia: Secondary | ICD-10-CM | POA: Diagnosis not present

## 2018-05-23 DIAGNOSIS — Z961 Presence of intraocular lens: Secondary | ICD-10-CM | POA: Diagnosis not present

## 2018-06-07 ENCOUNTER — Other Ambulatory Visit: Payer: Self-pay | Admitting: Family Medicine

## 2018-06-07 DIAGNOSIS — Z1231 Encounter for screening mammogram for malignant neoplasm of breast: Secondary | ICD-10-CM

## 2018-06-26 DIAGNOSIS — B351 Tinea unguium: Secondary | ICD-10-CM | POA: Diagnosis not present

## 2018-06-26 DIAGNOSIS — L851 Acquired keratosis [keratoderma] palmaris et plantaris: Secondary | ICD-10-CM | POA: Diagnosis not present

## 2018-06-26 DIAGNOSIS — Z794 Long term (current) use of insulin: Secondary | ICD-10-CM | POA: Diagnosis not present

## 2018-06-26 DIAGNOSIS — E1140 Type 2 diabetes mellitus with diabetic neuropathy, unspecified: Secondary | ICD-10-CM | POA: Diagnosis not present

## 2018-07-05 ENCOUNTER — Ambulatory Visit
Admission: RE | Admit: 2018-07-05 | Discharge: 2018-07-05 | Disposition: A | Discharge status: Home / Self Care | Payer: Medicare Other | Source: Ambulatory Visit | Attending: Family Medicine | Admitting: Family Medicine

## 2018-07-05 DIAGNOSIS — Z1231 Encounter for screening mammogram for malignant neoplasm of breast: Secondary | ICD-10-CM

## 2018-08-22 DIAGNOSIS — E1165 Type 2 diabetes mellitus with hyperglycemia: Secondary | ICD-10-CM | POA: Diagnosis not present

## 2018-08-22 DIAGNOSIS — Z6831 Body mass index (BMI) 31.0-31.9, adult: Secondary | ICD-10-CM | POA: Diagnosis not present

## 2018-08-22 DIAGNOSIS — I10 Essential (primary) hypertension: Secondary | ICD-10-CM | POA: Diagnosis not present

## 2018-08-22 DIAGNOSIS — E782 Mixed hyperlipidemia: Secondary | ICD-10-CM | POA: Diagnosis not present

## 2018-08-22 DIAGNOSIS — F419 Anxiety disorder, unspecified: Secondary | ICD-10-CM | POA: Diagnosis not present

## 2018-08-22 DIAGNOSIS — E1140 Type 2 diabetes mellitus with diabetic neuropathy, unspecified: Secondary | ICD-10-CM | POA: Diagnosis not present

## 2018-08-22 DIAGNOSIS — Z794 Long term (current) use of insulin: Secondary | ICD-10-CM | POA: Diagnosis not present

## 2018-08-22 DIAGNOSIS — Z23 Encounter for immunization: Secondary | ICD-10-CM | POA: Diagnosis not present

## 2018-09-18 DIAGNOSIS — H43813 Vitreous degeneration, bilateral: Secondary | ICD-10-CM | POA: Diagnosis not present

## 2018-09-18 DIAGNOSIS — E113293 Type 2 diabetes mellitus with mild nonproliferative diabetic retinopathy without macular edema, bilateral: Secondary | ICD-10-CM | POA: Diagnosis not present

## 2018-09-25 DIAGNOSIS — E1140 Type 2 diabetes mellitus with diabetic neuropathy, unspecified: Secondary | ICD-10-CM | POA: Diagnosis not present

## 2018-09-25 DIAGNOSIS — L851 Acquired keratosis [keratoderma] palmaris et plantaris: Secondary | ICD-10-CM | POA: Diagnosis not present

## 2018-09-25 DIAGNOSIS — Z794 Long term (current) use of insulin: Secondary | ICD-10-CM | POA: Diagnosis not present

## 2018-09-25 DIAGNOSIS — B351 Tinea unguium: Secondary | ICD-10-CM | POA: Diagnosis not present

## 2018-12-05 DIAGNOSIS — H9313 Tinnitus, bilateral: Secondary | ICD-10-CM | POA: Insufficient documentation

## 2018-12-05 DIAGNOSIS — H903 Sensorineural hearing loss, bilateral: Secondary | ICD-10-CM | POA: Insufficient documentation

## 2018-12-26 DIAGNOSIS — L851 Acquired keratosis [keratoderma] palmaris et plantaris: Secondary | ICD-10-CM | POA: Diagnosis not present

## 2018-12-26 DIAGNOSIS — B351 Tinea unguium: Secondary | ICD-10-CM | POA: Diagnosis not present

## 2018-12-26 DIAGNOSIS — Z794 Long term (current) use of insulin: Secondary | ICD-10-CM | POA: Diagnosis not present

## 2018-12-26 DIAGNOSIS — E1140 Type 2 diabetes mellitus with diabetic neuropathy, unspecified: Secondary | ICD-10-CM | POA: Diagnosis not present

## 2019-02-06 DIAGNOSIS — E782 Mixed hyperlipidemia: Secondary | ICD-10-CM | POA: Diagnosis not present

## 2019-02-06 DIAGNOSIS — E1165 Type 2 diabetes mellitus with hyperglycemia: Secondary | ICD-10-CM | POA: Diagnosis not present

## 2019-02-12 DIAGNOSIS — F419 Anxiety disorder, unspecified: Secondary | ICD-10-CM | POA: Diagnosis not present

## 2019-02-12 DIAGNOSIS — Z0001 Encounter for general adult medical examination with abnormal findings: Secondary | ICD-10-CM | POA: Diagnosis not present

## 2019-02-12 DIAGNOSIS — Z794 Long term (current) use of insulin: Secondary | ICD-10-CM | POA: Diagnosis not present

## 2019-02-12 DIAGNOSIS — I10 Essential (primary) hypertension: Secondary | ICD-10-CM | POA: Diagnosis not present

## 2019-02-12 DIAGNOSIS — E1140 Type 2 diabetes mellitus with diabetic neuropathy, unspecified: Secondary | ICD-10-CM | POA: Diagnosis not present

## 2019-02-12 DIAGNOSIS — E782 Mixed hyperlipidemia: Secondary | ICD-10-CM | POA: Diagnosis not present

## 2019-02-12 DIAGNOSIS — Z6831 Body mass index (BMI) 31.0-31.9, adult: Secondary | ICD-10-CM | POA: Diagnosis not present

## 2019-03-28 DIAGNOSIS — E1140 Type 2 diabetes mellitus with diabetic neuropathy, unspecified: Secondary | ICD-10-CM | POA: Diagnosis not present

## 2019-03-28 DIAGNOSIS — B351 Tinea unguium: Secondary | ICD-10-CM | POA: Diagnosis not present

## 2019-03-28 DIAGNOSIS — L851 Acquired keratosis [keratoderma] palmaris et plantaris: Secondary | ICD-10-CM | POA: Diagnosis not present

## 2019-03-28 DIAGNOSIS — Z794 Long term (current) use of insulin: Secondary | ICD-10-CM | POA: Diagnosis not present

## 2019-04-01 DIAGNOSIS — E119 Type 2 diabetes mellitus without complications: Secondary | ICD-10-CM | POA: Diagnosis not present

## 2019-04-23 DIAGNOSIS — L812 Freckles: Secondary | ICD-10-CM | POA: Diagnosis not present

## 2019-04-23 DIAGNOSIS — Z85828 Personal history of other malignant neoplasm of skin: Secondary | ICD-10-CM | POA: Diagnosis not present

## 2019-04-23 DIAGNOSIS — L821 Other seborrheic keratosis: Secondary | ICD-10-CM | POA: Diagnosis not present

## 2019-05-29 DIAGNOSIS — H52203 Unspecified astigmatism, bilateral: Secondary | ICD-10-CM | POA: Diagnosis not present

## 2019-05-29 DIAGNOSIS — Z961 Presence of intraocular lens: Secondary | ICD-10-CM | POA: Diagnosis not present

## 2019-05-29 DIAGNOSIS — H26493 Other secondary cataract, bilateral: Secondary | ICD-10-CM | POA: Diagnosis not present

## 2019-05-29 DIAGNOSIS — E119 Type 2 diabetes mellitus without complications: Secondary | ICD-10-CM | POA: Diagnosis not present

## 2019-06-27 DIAGNOSIS — E1140 Type 2 diabetes mellitus with diabetic neuropathy, unspecified: Secondary | ICD-10-CM | POA: Diagnosis not present

## 2019-06-27 DIAGNOSIS — Z794 Long term (current) use of insulin: Secondary | ICD-10-CM | POA: Diagnosis not present

## 2019-06-27 DIAGNOSIS — L851 Acquired keratosis [keratoderma] palmaris et plantaris: Secondary | ICD-10-CM | POA: Diagnosis not present

## 2019-06-27 DIAGNOSIS — B351 Tinea unguium: Secondary | ICD-10-CM | POA: Diagnosis not present

## 2019-08-27 DIAGNOSIS — Z23 Encounter for immunization: Secondary | ICD-10-CM | POA: Diagnosis not present

## 2019-08-27 DIAGNOSIS — F419 Anxiety disorder, unspecified: Secondary | ICD-10-CM | POA: Diagnosis not present

## 2019-08-27 DIAGNOSIS — I10 Essential (primary) hypertension: Secondary | ICD-10-CM | POA: Diagnosis not present

## 2019-08-27 DIAGNOSIS — E1140 Type 2 diabetes mellitus with diabetic neuropathy, unspecified: Secondary | ICD-10-CM | POA: Diagnosis not present

## 2019-08-27 DIAGNOSIS — R3989 Other symptoms and signs involving the genitourinary system: Secondary | ICD-10-CM | POA: Diagnosis not present

## 2019-08-27 DIAGNOSIS — Z794 Long term (current) use of insulin: Secondary | ICD-10-CM | POA: Diagnosis not present

## 2019-09-10 ENCOUNTER — Other Ambulatory Visit: Payer: Self-pay | Admitting: Family Medicine

## 2019-09-10 DIAGNOSIS — Z1231 Encounter for screening mammogram for malignant neoplasm of breast: Secondary | ICD-10-CM

## 2019-09-26 DIAGNOSIS — B351 Tinea unguium: Secondary | ICD-10-CM | POA: Diagnosis not present

## 2019-09-26 DIAGNOSIS — Z794 Long term (current) use of insulin: Secondary | ICD-10-CM | POA: Diagnosis not present

## 2019-09-26 DIAGNOSIS — L851 Acquired keratosis [keratoderma] palmaris et plantaris: Secondary | ICD-10-CM | POA: Diagnosis not present

## 2019-09-26 DIAGNOSIS — E1140 Type 2 diabetes mellitus with diabetic neuropathy, unspecified: Secondary | ICD-10-CM | POA: Diagnosis not present

## 2019-11-05 ENCOUNTER — Ambulatory Visit
Admission: RE | Admit: 2019-11-05 | Discharge: 2019-11-05 | Disposition: A | Discharge status: Home / Self Care | Payer: Medicare Other | Source: Ambulatory Visit | Attending: Family Medicine | Admitting: Family Medicine

## 2019-11-05 ENCOUNTER — Other Ambulatory Visit: Payer: Self-pay

## 2019-11-05 DIAGNOSIS — Z1231 Encounter for screening mammogram for malignant neoplasm of breast: Secondary | ICD-10-CM

## 2020-02-17 ENCOUNTER — Other Ambulatory Visit: Payer: Self-pay | Admitting: Family Medicine

## 2020-02-17 DIAGNOSIS — E2839 Other primary ovarian failure: Secondary | ICD-10-CM

## 2020-06-16 ENCOUNTER — Other Ambulatory Visit: Payer: Self-pay

## 2020-06-16 ENCOUNTER — Ambulatory Visit
Admission: RE | Admit: 2020-06-16 | Discharge: 2020-06-16 | Disposition: A | Discharge status: Home / Self Care | Payer: Medicare Other | Source: Ambulatory Visit | Attending: Family Medicine | Admitting: Family Medicine

## 2020-06-16 DIAGNOSIS — E2839 Other primary ovarian failure: Secondary | ICD-10-CM

## 2020-11-24 ENCOUNTER — Encounter: Payer: Self-pay | Admitting: Internal Medicine

## 2020-12-24 DIAGNOSIS — L851 Acquired keratosis [keratoderma] palmaris et plantaris: Secondary | ICD-10-CM | POA: Diagnosis not present

## 2020-12-24 DIAGNOSIS — Z794 Long term (current) use of insulin: Secondary | ICD-10-CM | POA: Diagnosis not present

## 2020-12-24 DIAGNOSIS — B351 Tinea unguium: Secondary | ICD-10-CM | POA: Diagnosis not present

## 2020-12-24 DIAGNOSIS — E1140 Type 2 diabetes mellitus with diabetic neuropathy, unspecified: Secondary | ICD-10-CM | POA: Diagnosis not present

## 2021-02-16 DIAGNOSIS — E1140 Type 2 diabetes mellitus with diabetic neuropathy, unspecified: Secondary | ICD-10-CM | POA: Diagnosis not present

## 2021-02-16 DIAGNOSIS — E782 Mixed hyperlipidemia: Secondary | ICD-10-CM | POA: Diagnosis not present

## 2021-02-16 DIAGNOSIS — I10 Essential (primary) hypertension: Secondary | ICD-10-CM | POA: Diagnosis not present

## 2021-02-19 DIAGNOSIS — D649 Anemia, unspecified: Secondary | ICD-10-CM | POA: Diagnosis not present

## 2021-02-19 DIAGNOSIS — R000 Tachycardia, unspecified: Secondary | ICD-10-CM | POA: Diagnosis not present

## 2021-02-19 DIAGNOSIS — F5104 Psychophysiologic insomnia: Secondary | ICD-10-CM | POA: Diagnosis not present

## 2021-02-19 DIAGNOSIS — I4891 Unspecified atrial fibrillation: Secondary | ICD-10-CM | POA: Diagnosis not present

## 2021-02-19 DIAGNOSIS — E782 Mixed hyperlipidemia: Secondary | ICD-10-CM | POA: Diagnosis not present

## 2021-02-19 DIAGNOSIS — I10 Essential (primary) hypertension: Secondary | ICD-10-CM | POA: Diagnosis not present

## 2021-02-19 DIAGNOSIS — E1140 Type 2 diabetes mellitus with diabetic neuropathy, unspecified: Secondary | ICD-10-CM | POA: Diagnosis not present

## 2021-02-19 DIAGNOSIS — Z0000 Encounter for general adult medical examination without abnormal findings: Secondary | ICD-10-CM | POA: Diagnosis not present

## 2021-02-25 NOTE — Progress Notes (Signed)
Cardiology Office Note:   Date:  02/26/2021  NAME:  Danielle [name]    MRN: [id number] DOB:  27-May-1935   PCP:  Darcus Austin, MD (Inactive)  Cardiologist:  None  Electrophysiologist:  None   Referring MD: Glenis Smoker, *   Chief Complaint  Patient presents with  . Atrial Fibrillation    History of Present Illness:   Danielle [name] is a 86 y.o. female with a hx of DM, HTN, HLD who is being seen today for the evaluation of atrial fibrillation at the request of Glenis Smoker, *.  She was recently seen by her primary care physician and found to be in atrial fibrillation.  EKG in office demonstrates A. fib with RVR with heart rate 127.  She was placed on Eliquis but no rate control medications.  She denies any chest pain in office today.  She reports for the past 1 year she has become progressively more short of breath.  This occurs with activity.  She also reports it occurs when laying down.  Lab work from PCP shows that she is anemic with a hemoglobin of 10.  She was started on iron supplementation.  She does suffer from hemorrhoids and has plans to see her gastro enterologist soon.  I suspect she will need a repeat colonoscopy.  She was started on Eliquis.  She remains on aspirin.  I recommended she stop aspirin.  She has no increased edema.  Blood pressure 104/68.  She has never had a heart attack or stroke.  Medical history is significant for diabetes, hyperlipidemia and hypertension.  BP well controlled.  She has normal kidney function.  She reports a family history of heart disease.  She personally has never had a heart attack or stroke.  She reports that she does not exercise routinely but has some limitations with doing activities around the house.  This changes occurred over the past 1 year.  She is widowed.  She has 3 children.  She has 6 grandchildren.  She has several great-grandchildren.  Her diabetes is fairly controlled with an A1c of 7.8.  Problem List 1. DM -A1c  7.8 2. HLD -T chol 88, HDL 33, LDL 25, TG 185 3. HTN 4. Persistent Afib -Dx 02/26/2021 -CHADSVASC=5 (age, female, DM, HTN)  Past Medical History: Past Medical History:  Diagnosis Date  . Arthritis   . Cataract    bilateral  . Diabetes mellitus without complication ([place])    type two  . GERD (gastroesophageal reflux disease)   . Hypercholesteremia   . Hypertension   . Peripheral neuropathy     Past Surgical History: Past Surgical History:  Procedure Laterality Date  . APPENDECTOMY  1957  . BREAST EXCISIONAL BIOPSY Right 1966  . CHOLECYSTECTOMY  10/2008  . lump removed right breast  1958   bENIGN  . right ankle plate  4098  . VAGINAL HYSTERECTOMY  1978    Current Medications: Current Meds  Medication Sig  . ALPRAZolam (XANAX) 0.5 MG tablet Take 0.5 mg by mouth. 1-2 tablets three times a day as needed  . Apixaban (ELIQUIS PO) Take 1 tablet by mouth in the morning and at bedtime.  Marland Kitchen atorvastatin (LIPITOR) 20 MG tablet Take 10 mg by mouth daily.  . calcium carbonate (TUMS - DOSED IN MG ELEMENTAL CALCIUM) 500 MG chewable tablet Chew 2 tablets by mouth as needed for indigestion or heartburn.  . Cholecalciferol (VITAMIN D3) 1000 UNITS CAPS Take 1,000 Units by mouth daily.  Marland Kitchen  gabapentin (NEURONTIN) 300 MG capsule Take 300 mg by mouth 2 (two) times daily.  . Glucosamine-Chondroit-Vit C-Mn (GLUCOSAMINE CHONDR 1500 COMPLX PO) Take 2 capsules by mouth daily.  . insulin glargine (LANTUS) 100 UNIT/ML injection Inject 26 Units into the skin at bedtime.  . lansoprazole (PREVACID) 15 MG capsule Take 15 mg by mouth as needed.  Marland Kitchen lisinopril (ZESTRIL) 40 MG tablet Take 40 mg by mouth daily.  . metFORMIN (GLUMETZA) 1000 MG (MOD) 24 hr tablet Take 1,000 mg by mouth 2 (two) times daily with a meal.  . metoprolol succinate (TOPROL-XL) 25 MG 24 hr tablet Take 1 tablet (25 mg total) by mouth daily.  . pioglitazone (ACTOS) 30 MG tablet Take 30 mg by mouth daily.  . [DISCONTINUED] amLODipine  (NORVASC) 2.5 MG tablet Take 2.5 mg by mouth daily.  . [DISCONTINUED] aspirin 81 MG tablet Take 81 mg by mouth daily.  . [DISCONTINUED] lisinopril (PRINIVIL,ZESTRIL) 30 MG tablet Take 30 mg by mouth daily.     Allergies:    Amoxicillin, Hydrochlorothiazide, Cleocin [clindamycin hcl], Codeine, and Penicillins   Social History: Social History   Socioeconomic History  . Marital status: Widowed    Spouse name: Not on file  . Number of children: 3  . Years of education: Not on file  . Highest education level: Not on file  Occupational History  . Not on file  Tobacco Use  . Smoking status: Never Smoker  . Smokeless tobacco: Never Used  Substance and Sexual Activity  . Alcohol use: No    Alcohol/week: 0.0 standard drinks  . Drug use: No  . Sexual activity: Not on file  Other Topics Concern  . Not on file  Social History Narrative  . Not on file   Social Determinants of Health   Financial Resource Strain: Not on file  Food Insecurity: Not on file  Transportation Needs: Not on file  Physical Activity: Not on file  Stress: Not on file  Social Connections: Not on file     Family History: The patient's family history includes CAD in her sister; CVA in her mother; Cerebral aneurysm in her father; Colon cancer in her mother and sister; Diabetes in her mother; Heart attack in her sister; Hypertension in her mother; Parkinson's disease in her sister; Stroke in her mother; Uterine cancer in her sister.  ROS:   All other ROS reviewed and negative. Pertinent positives noted in the HPI.     EKGs/Labs/Other Studies Reviewed:   The following studies were personally reviewed by me today:  EKG:  EKG is ordered today.  The ekg ordered today demonstrates atrial fibrillation, heart 127, right bundle branch block, and was personally reviewed by me.   Recent Labs: No results found for requested labs within last 8760 hours.   Recent Lipid Panel    Component Value Date/Time   CHOL   11/10/2008 0317    159        ATP III CLASSIFICATION:  <200     mg/dL   Desirable  200-239  mg/dL   Borderline High  >=240    mg/dL   High          TRIG 224 (H) 11/10/2008 0317   HDL 42 11/10/2008 0317   CHOLHDL 3.8 11/10/2008 0317   VLDL 45 (H) 11/10/2008 0317   LDLCALC  11/10/2008 0317    72        Total Cholesterol/HDL:CHD Risk Coronary Heart Disease Risk Table  Men   Women  1/2 Average Risk   3.4   3.3  Average Risk       5.0   4.4  2 X Average Risk   9.6   7.1  3 X Average Risk  23.4   11.0        Use the calculated Patient Ratio above and the CHD Risk Table to determine the patient's CHD Risk.        ATP III CLASSIFICATION (LDL):  <100     mg/dL   Optimal  604-540  mg/dL   Near or Above                    Optimal  130-159  mg/dL   Borderline  981-191  mg/dL   High  >478     mg/dL   Very High    Physical Exam:   VS:  BP 104/68 (BP Location: Left Arm, Patient Position: Sitting, Cuff Size: Normal)   Pulse (!) 127   Ht 5\' 3"  (1.6 m)   Wt 159 lb (72.1 kg)   BMI 28.17 kg/m    Wt Readings from Last 3 Encounters:  02/26/21 159 lb (72.1 kg)  05/04/15 186 lb (84.4 kg)    General: Well nourished, well developed, in no acute distress Head: Atraumatic, normal size  Eyes: PEERLA, EOMI  Neck: Supple, no JVD Endocrine: No thryomegaly Cardiac: Normal S1, S2; irregular rhythm, no murmurs rubs or gallops Lungs: Clear to auscultation bilaterally, no wheezing, rhonchi or rales  Abd: Soft, nontender, no hepatomegaly  Ext: No edema, pulses 2+ Musculoskeletal: No deformities, BUE and BLE strength normal and equal Skin: Warm and dry, no rashes   Neuro: Alert and oriented to person, place, time, and situation, CNII-XII grossly intact, no focal deficits  Psych: Normal mood and affect   ASSESSMENT:   Danielle [name] is a 86 y.o. female who presents for the following: 1. Persistent atrial fibrillation (HCC)   2. SOB (shortness of breath) on exertion      PLAN:   1. Persistent atrial fibrillation (HCC) 2. SOB (shortness of breath) on exertion -Shortness of breath 1 year.  Found to have A. fib.  No murmurs on examination.  No evidence of heart failure.  Suspect this is related to her age and diabetes.  She needs an echocardiogram.  No concerns for angina.  We will hold on a stress test for now. -Shortness of breath could be multifactorial.  She is anemic with a hemoglobin of 10.  I have recommended GI evaluation.  She is on Eliquis and we will need to be mindful of this.  No strong indication for aspirin.  We will stop aspirin while she is on Eliquis. -She needs TSH checked.  Need to make sure the thyroid is okay. -We discussed rhythm versus rate control strategy.  Given her anemia I am reluctant to recommend a rhythm control strategy as this would result in uninterrupted anticoagulation.  I think the best option for now is rate control and to check an echocardiogram.  We will stop her Norvasc and start her on metoprolol succinate 25 mg daily.  She will see me back in 6 weeks after her echocardiogram.  Hopefully her anemia will be sorted out by then.  If she went into dizziness or lightheadedness she will need reevaluation and likely come off her Eliquis. -We will see how she does with rate control.  She is relatively asymptomatic from her A. fib.  I  think anemia could be causing more of an issue.  We will just see how she does rate controlled on metoprolol and then reevaluate her need for possible cardioversion.  Disposition: Return in about 6 weeks (around 04/09/2021).  Medication Adjustments/Labs and Tests Ordered: Current medicines are reviewed at length with the patient today.  Concerns regarding medicines are outlined above.  Orders Placed This Encounter  Procedures  . Basic metabolic panel  . TSH  . EKG 12-Lead  . ECHOCARDIOGRAM COMPLETE   Meds ordered this encounter  Medications  . metoprolol succinate (TOPROL-XL) 25 MG 24 hr tablet     Sig: Take 1 tablet (25 mg total) by mouth daily.    Dispense:  90 tablet    Refill:  1    Patient Instructions  Medication Instructions:  Stop Amlodipine Stop Aspirin Start Metoprolol Succinate 25 mg daily   *If you need a refill on your cardiac medications before your next appointment, please call your pharmacy*   Lab Work: TSH, BMET today  If you have labs (blood work) drawn today and your tests are completely normal, you will receive your results only by: Marland Kitchen MyChart Message (if you have MyChart) OR . A paper copy in the mail If you have any lab test that is abnormal or we need to change your treatment, we will call you to review the results.   Testing/Procedures: Echocardiogram - Your physician has requested that you have an echocardiogram. Echocardiography is a painless test that uses sound waves to create images of your heart. It provides your doctor with information about the size and shape of your heart and how well your heart's chambers and valves are working. This procedure takes approximately one hour. There are no restrictions for this procedure. This will be performed at our [facility] location - [address].    Follow-Up: At [facility], you and your health needs are our priority.  As part of our continuing mission to provide you with exceptional heart care, we have created designated Provider Care Teams.  These Care Teams include your primary Cardiologist (physician) and Advanced Practice Providers (APPs -  Physician Assistants and Nurse Practitioners) who all work together to provide you with the care you need, when you need it.  We recommend signing up for the patient portal called "MyChart".  Sign up information is provided on this After Visit Summary.  MyChart is used to connect with patients for Virtual Visits (Telemedicine).  Patients are able to view lab/test results, encounter notes, upcoming appointments, etc.  Non-urgent messages can be sent  to your provider as well.   To learn more about what you can do with MyChart, go to [email or web address].    Your next appointment:   June 10th, at 10:00 AM  The format for your next appointment:   In Person  Provider:   Eleonore Chiquito, MD        Signed, Addison Naegeli. Audie Box, MD, [place]  [address] [place], [place] [address] [phone]  02/26/2021 9:58 AM

## 2021-02-26 ENCOUNTER — Ambulatory Visit (INDEPENDENT_AMBULATORY_CARE_PROVIDER_SITE_OTHER): Payer: Medicare Other | Admitting: Cardiovascular Disease

## 2021-02-26 ENCOUNTER — Other Ambulatory Visit: Payer: Self-pay

## 2021-02-26 ENCOUNTER — Encounter: Payer: Self-pay | Admitting: Cardiovascular Disease

## 2021-02-26 ENCOUNTER — Encounter: Payer: Self-pay | Admitting: Internal Medicine

## 2021-02-26 VITALS — BP 104/68 | HR 127 | Ht 63.0 in | Wt 159.0 lb

## 2021-02-26 DIAGNOSIS — I4819 Other persistent atrial fibrillation: Secondary | ICD-10-CM | POA: Diagnosis not present

## 2021-02-26 DIAGNOSIS — R0602 Shortness of breath: Secondary | ICD-10-CM | POA: Diagnosis not present

## 2021-02-26 DIAGNOSIS — Z1211 Encounter for screening for malignant neoplasm of colon: Secondary | ICD-10-CM | POA: Diagnosis not present

## 2021-02-26 LAB — BASIC METABOLIC PANEL
BUN/Creatinine Ratio: 15 (ref 12–28)
BUN: 12 mg/dL (ref 8–27)
CO2: 22 mmol/L (ref 20–29)
Calcium: 9.6 mg/dL (ref 8.7–10.3)
Chloride: 101 mmol/L (ref 96–106)
Creatinine, Ser: 0.82 mg/dL (ref 0.57–1.00)
Glucose: 122 mg/dL — ABNORMAL HIGH (ref 65–99)
Potassium: 4.4 mmol/L (ref 3.5–5.2)
Sodium: 139 mmol/L (ref 134–144)
eGFR: 71 mL/min/{1.73_m2} (ref >59)

## 2021-02-26 LAB — TSH: TSH: 2.07 u[IU]/mL (ref 0.450–4.500)

## 2021-02-26 MED ORDER — METOPROLOL SUCCINATE ER 25 MG PO TB24: 25.0000 mg | ORAL_TABLET | Freq: Every day | ORAL | 1 refills | Status: DC

## 2021-02-26 NOTE — Patient Instructions (Signed)
Medication Instructions:  Stop Amlodipine Stop Aspirin Start Metoprolol Succinate 25 mg daily   *If you need a refill on your cardiac medications before your next appointment, please call your pharmacy*   Lab Work: TSH, BMET today  If you have labs (blood work) drawn today and your tests are completely normal, you will receive your results only by: Marland Kitchen MyChart Message (if you have MyChart) OR . A paper copy in the mail If you have any lab test that is abnormal or we need to change your treatment, we will call you to review the results.   Testing/Procedures: Echocardiogram - Your physician has requested that you have an echocardiogram. Echocardiography is a painless test that uses sound waves to create images of your heart. It provides your doctor with information about the size and shape of your heart and how well your heart's chambers and valves are working. This procedure takes approximately one hour. There are no restrictions for this procedure. This will be performed at our [facility] location - [address].    Follow-Up: At [facility], you and your health needs are our priority.  As part of our continuing mission to provide you with exceptional heart care, we have created designated Provider Care Teams.  These Care Teams include your primary Cardiologist (physician) and Advanced Practice Providers (APPs -  Physician Assistants and Nurse Practitioners) who all work together to provide you with the care you need, when you need it.  We recommend signing up for the patient portal called "MyChart".  Sign up information is provided on this After Visit Summary.  MyChart is used to connect with patients for Virtual Visits (Telemedicine).  Patients are able to view lab/test results, encounter notes, upcoming appointments, etc.  Non-urgent messages can be sent to your provider as well.   To learn more about what you can do with MyChart, go to [email or web address].    Your  next appointment:   June 10th, at 10:00 AM  The format for your next appointment:   In Person  Provider:   Eleonore Chiquito, MD

## 2021-03-08 DIAGNOSIS — H9192 Unspecified hearing loss, left ear: Secondary | ICD-10-CM | POA: Diagnosis not present

## 2021-03-08 DIAGNOSIS — I4891 Unspecified atrial fibrillation: Secondary | ICD-10-CM | POA: Diagnosis not present

## 2021-03-08 DIAGNOSIS — D509 Iron deficiency anemia, unspecified: Secondary | ICD-10-CM | POA: Diagnosis not present

## 2021-03-08 DIAGNOSIS — F5104 Psychophysiologic insomnia: Secondary | ICD-10-CM | POA: Diagnosis not present

## 2021-03-08 DIAGNOSIS — R200 Anesthesia of skin: Secondary | ICD-10-CM | POA: Diagnosis not present

## 2021-03-23 ENCOUNTER — Other Ambulatory Visit: Payer: Self-pay

## 2021-03-23 ENCOUNTER — Ambulatory Visit (HOSPITAL_COMMUNITY): Payer: Medicare Other | Attending: Internal Medicine

## 2021-03-23 DIAGNOSIS — I4819 Other persistent atrial fibrillation: Secondary | ICD-10-CM | POA: Insufficient documentation

## 2021-03-23 LAB — ECHOCARDIOGRAM COMPLETE
Area-P 1/2: 3.42 cm2
MV VTI: 1.8 cm2
S' Lateral: 2.9 cm

## 2021-03-29 DIAGNOSIS — L851 Acquired keratosis [keratoderma] palmaris et plantaris: Secondary | ICD-10-CM | POA: Diagnosis not present

## 2021-03-29 DIAGNOSIS — E1140 Type 2 diabetes mellitus with diabetic neuropathy, unspecified: Secondary | ICD-10-CM | POA: Diagnosis not present

## 2021-03-29 DIAGNOSIS — B351 Tinea unguium: Secondary | ICD-10-CM | POA: Diagnosis not present

## 2021-03-29 DIAGNOSIS — Z794 Long term (current) use of insulin: Secondary | ICD-10-CM | POA: Diagnosis not present

## 2021-04-01 NOTE — Progress Notes (Signed)
Cardiology Office Note:   Date:  04/02/2021  NAME:  Danielle [name]    MRN: [id number] DOB:  [birth year]   PCP:  Glenis Smoker, MD  Cardiologist:  None  Electrophysiologist:  None   Referring MD: No ref. provider found   Chief Complaint  Patient presents with   Follow-up   History of Present Illness:   Danielle [name] is a 86 y.o. female with a hx of diabetes, hypertension, persistent atrial fibrillation who presents for further evaluation.  She was seen on 02/26/2021 for new onset A. fib.  She has no symptoms from this.  Echocardiogram shows normal LV function.  Rate control strategy was recommended given her age.  She presents for follow-up today. Heart rate is much improved on metoprolol.  BP a bit low.  She still reports that she is short of breath with exertion.  She was found to be anemic with a hemoglobin of 10.  She will see gastroenterology for possible repeat colonoscopy.  I would like to recheck her CBC today as she has been on Eliquis.  Her blood pressure is 114/64.  She is on lisinopril as well.  Likely on too much BP medication.  We will stop this as we would like to increase her metoprolol.  It is unclear what is causing her shortness of breath.  I suspect this could be a combination of A. fib as well as her anemia.  I think the anemia needs to be sorted out first.  Clearly she cannot undergo cardioversion as this would require uninterrupted anticoagulation.  I did discuss this with her in the office.  She would like to think about a cardioversion.  I do agree that we would not jump into this without thinking carefully given her age.  I think it is reasonable to attempt cardioversion but we would like for her anemia to be sorted out first.  Her echocardiogram is reassuring as it shows normal LV function.   Problem List 1. DM -A1c 7.8 2. HLD -T chol 88, HDL 33, LDL 25, TG 185 3. HTN 4. Persistent Afib -Dx 02/26/2021 -CHADSVASC=5 (age, female, DM, HTN) 5. Iron deficiency  anemia -HGB 10.0  Past Medical History: Past Medical History:  Diagnosis Date   Arthritis    Cataract    bilateral   Diabetes mellitus without complication ([place])    type two   GERD (gastroesophageal reflux disease)    Hypercholesteremia    Hypertension    Peripheral neuropathy     Past Surgical History: Past Surgical History:  Procedure Laterality Date   APPENDECTOMY  1957   BREAST EXCISIONAL BIOPSY Right 1966   CHOLECYSTECTOMY  10/2008   lump removed right breast  1958   bENIGN   right ankle plate  6283   VAGINAL HYSTERECTOMY  1978    Current Medications: Current Meds  Medication Sig   Apixaban (ELIQUIS PO) Take 1 tablet by mouth in the morning and at bedtime.   atorvastatin (LIPITOR) 10 MG tablet Take 1 tablet by mouth daily.   atorvastatin (LIPITOR) 20 MG tablet Take 10 mg by mouth daily.   calcium carbonate (OSCAL) 1500 (600 Ca) MG TABS tablet 1 tablet with meals   calcium carbonate (TUMS - DOSED IN MG ELEMENTAL CALCIUM) 500 MG chewable tablet Chew 2 tablets by mouth as needed for indigestion or heartburn.   Cholecalciferol (VITAMIN D3) 1000 UNITS CAPS Take 1,000 Units by mouth daily.   FERROUS SULFATE ER PO Take by mouth.  gabapentin (NEURONTIN) 300 MG capsule Take 300 mg by mouth 2 (two) times daily.   Glucosamine-Chondroit-Vit C-Mn (GLUCOSAMINE CHONDR 1500 COMPLX PO) Take 2 capsules by mouth daily.   insulin glargine (LANTUS) 100 UNIT/ML injection Inject 26 Units into the skin at bedtime.   lansoprazole (PREVACID) 15 MG capsule Take 15 mg by mouth as needed.   metFORMIN (GLUMETZA) 1000 MG (MOD) 24 hr tablet Take 1,000 mg by mouth 2 (two) times daily with a meal.   metoprolol succinate (TOPROL-XL) 50 MG 24 hr tablet Take 1 tablet (50 mg total) by mouth daily. Take with or immediately following a meal.   [DISCONTINUED] lisinopril (ZESTRIL) 40 MG tablet Take 40 mg by mouth daily.   [DISCONTINUED] metoprolol succinate (TOPROL-XL) 25 MG 24 hr tablet Take 1 tablet (25  mg total) by mouth daily.   [DISCONTINUED] pioglitazone (ACTOS) 30 MG tablet Take 30 mg by mouth daily.     Allergies:    Amoxicillin, Hydrochlorothiazide, Cleocin [clindamycin hcl], Codeine, and Penicillins   Social History: Social History   Socioeconomic History   Marital status: Widowed    Spouse name: Not on file   Number of children: 3   Years of education: Not on file   Highest education level: Not on file  Occupational History   Not on file  Tobacco Use   Smoking status: Never   Smokeless tobacco: Never  Substance and Sexual Activity   Alcohol use: No    Alcohol/week: 0.0 standard drinks   Drug use: No   Sexual activity: Not on file  Other Topics Concern   Not on file  Social History Narrative   Not on file   Social Determinants of Health   Financial Resource Strain: Not on file  Food Insecurity: Not on file  Transportation Needs: Not on file  Physical Activity: Not on file  Stress: Not on file  Social Connections: Not on file     Family History: The patient's family history includes CAD in her sister; CVA in her mother; Cerebral aneurysm in her father; Colon cancer in her mother and sister; Diabetes in her mother; Heart attack in her sister; Hypertension in her mother; Parkinson's disease in her sister; Stroke in her mother; Uterine cancer in her sister.  ROS:   All other ROS reviewed and negative. Pertinent positives noted in the HPI.     EKGs/Labs/Other Studies Reviewed:   The following studies were personally reviewed by me today:  TTE 03/23/2021  1. Left ventricular ejection fraction, by estimation, is 55 to 60%. The  left ventricle has normal function. The left ventricle has no regional  wall motion abnormalities. There is mild left ventricular hypertrophy.  Left ventricular diastolic function  could not be evaluated.   2. Right ventricular systolic function is normal. The right ventricular  size is normal. There is moderately elevated pulmonary  artery systolic  pressure. The estimated right ventricular systolic pressure is 53.2 mmHg.   3. Left atrial size was severely dilated.   4. The mitral valve is abnormal. Trivial mitral valve regurgitation.   5. Tricuspid valve regurgitation is mild to moderate.   6. The aortic valve is tricuspid. Aortic valve regurgitation is not  visualized. Mild to moderate aortic valve sclerosis/calcification is  present, without any evidence of aortic stenosis.   7. The inferior vena cava is normal in size with <50% respiratory  variability, suggesting right atrial pressure of 8 mmHg.   Recent Labs: 02/26/2021: BUN 12; Creatinine, Ser 0.82; Potassium 4.4; Sodium  139; TSH 2.070   Recent Lipid Panel    Component Value Date/Time   CHOL  11/10/2008 0317    159        ATP III CLASSIFICATION:  <200     mg/dL   Desirable  200-239  mg/dL   Borderline High  >=240    mg/dL   High          TRIG 224 (H) 11/10/2008 0317   HDL 42 11/10/2008 0317   CHOLHDL 3.8 11/10/2008 0317   VLDL 45 (H) 11/10/2008 0317   LDLCALC  11/10/2008 0317    72        Total Cholesterol/HDL:CHD Risk Coronary Heart Disease Risk Table                     Men   Women  1/2 Average Risk   3.4   3.3  Average Risk       5.0   4.4  2 X Average Risk   9.6   7.1  3 X Average Risk  23.4   11.0        Use the calculated Patient Ratio above and the CHD Risk Table to determine the patient's CHD Risk.        ATP III CLASSIFICATION (LDL):  <100     mg/dL   Optimal  100-129  mg/dL   Near or Above                    Optimal  130-159  mg/dL   Borderline  160-189  mg/dL   High  >190     mg/dL   Very High    Physical Exam:   VS:  BP 114/64   Pulse 64   Ht 5\' 3"  (1.6 m)   Wt 162 lb 9.6 oz (73.8 kg)   SpO2 98%   BMI 28.80 kg/m    Wt Readings from Last 3 Encounters:  04/02/21 162 lb 9.6 oz (73.8 kg)  02/26/21 159 lb (72.1 kg)  05/04/15 186 lb (84.4 kg)    General: Well nourished, well developed, in no acute distress Head:  Atraumatic, normal size  Eyes: PEERLA, EOMI  Neck: Supple, no JVD Endocrine: No thryomegaly Cardiac: Normal S1, S2; irregular rhythm, no murmurs rubs or gallops Lungs: Clear to auscultation bilaterally, no wheezing, rhonchi or rales  Abd: Soft, nontender, no hepatomegaly  Ext: No edema, pulses 2+ Musculoskeletal: No deformities, BUE and BLE strength normal and equal Skin: Warm and dry, no rashes   Neuro: Alert and oriented to person, place, time, and situation, CNII-XII grossly intact, no focal deficits  Psych: Normal mood and affect   ASSESSMENT:   Danielle [name] is a 86 y.o. female who presents for the following: 1. Persistent atrial fibrillation ([place])   2. SOB (shortness of breath) on exertion   3. Primary hypertension   4. Chronic fatigue     PLAN:   1. Persistent atrial fibrillation ([facility]) -CHADSVASC=5.  She will continue Eliquis 5 mg twice daily.  Unclear what caused her A. fib.  Her thyroid study is normal.  She has a structurally normal heart.  She has no evidence of mitral valve disease.  She describes no symptoms of angina.  Her symptoms of shortness of breath have occurred for roughly 1 year.  It is unclear what is causing this.  She is also anemic.  Her most recent hemoglobin was 10.  This could be contributing. -We discussed an attempt at  cardioversion.  This would require uninterrupted anticoagulation.  Given her anemia I am reluctant to do this.  She needs to see GI to have this evaluated before we proceed with cardioversion.  I would like to know that it is safe for her to be anticoagulated uninterrupted. -We will recheck a CBC today just to make sure were not causing any issues while on Eliquis. -Her heart rate does increase up in the 120s and 130s when she exerts herself.  I would like to increase her metoprolol succinate to 50 mg daily.  We will stop her lisinopril as her BP is a bit low.  Do not want to increase the dose and have her blood pressure be too low. -I will  then see her back in roughly 3 months.  She will have time to see GI and get her anemia resolved.  We will then discuss an attempt at cardioversion. -She may ultimately need a stress test.  At this time given her anemia I am in no hurry to do this.  She cannot have any ischemic evaluation until her anemia is resolved.  2. SOB (shortness of breath) on exertion -Shortness of breath.  Cloudy picture.  She is anemic with a hemoglobin of 10.  Also with new onset A. fib.  She will need GI evaluation before we proceed with cardioversion.  We need to know it is safe for her to be on uninterrupted anticoagulation.  3. Primary hypertension -Stop lisinopril.  Increase metoprolol succinate to 50 mg daily.  4. Chronic fatigue -Either anemia related or A. fib related.  GI evaluation pending.  Repeat CBC today.  Continue with rate control for now for her A. fib.   Disposition: Return in about 3 months (around 07/03/2021).  Medication Adjustments/Labs and Tests Ordered: Current medicines are reviewed at length with the patient today.  Concerns regarding medicines are outlined above.  Orders Placed This Encounter  Procedures   CBC    Meds ordered this encounter  Medications   metoprolol succinate (TOPROL-XL) 50 MG 24 hr tablet    Sig: Take 1 tablet (50 mg total) by mouth daily. Take with or immediately following a meal.    Dispense:  90 tablet    Refill:  3     Patient Instructions  Medication Instructions:  STOP lisinopril Start Metoprolol Succinate to 50 mg daily   *If you need a refill on your cardiac medications before your next appointment, please call your pharmacy*   Lab Work: CBC today   If you have labs (blood work) drawn today and your tests are completely normal, you will receive your results only by: [place] (if you have MyChart) OR A paper copy in the mail If you have any lab test that is abnormal or we need to change your treatment, we will call you to review the  results.   Follow-Up: At [facility], you and your health needs are our priority.  As part of our continuing mission to provide you with exceptional heart care, we have created designated Provider Care Teams.  These Care Teams include your primary Cardiologist (physician) and Advanced Practice Providers (APPs -  Physician Assistants and Nurse Practitioners) who all work together to provide you with the care you need, when you need it.  We recommend signing up for the patient portal called "MyChart".  Sign up information is provided on this After Visit Summary.  MyChart is used to connect with patients for Virtual Visits (Telemedicine).  Patients are able  to view lab/test results, encounter notes, upcoming appointments, etc.  Non-urgent messages can be sent to your provider as well.   To learn more about what you can do with MyChart, go to [email or web address].    Your next appointment:   3 month(s)  The format for your next appointment:   In Person  Provider:   Eleonore Chiquito, MD   Other Instructions Please see your GI doctor in regards to your anemia.    Time Spent with Patient: I have spent a total of 35 minutes with patient reviewing hospital notes, telemetry, EKGs, labs and examining the patient as well as establishing an assessment and plan that was discussed with the patient.  > 50% of time was spent in direct patient care.  Signed, Addison Naegeli. Audie Box, MD, [place]  [address] [place], [place] [address] [phone]  04/02/2021 10:38 AM

## 2021-04-02 ENCOUNTER — Encounter: Payer: Self-pay | Admitting: Cardiovascular Disease

## 2021-04-02 ENCOUNTER — Other Ambulatory Visit: Payer: Self-pay

## 2021-04-02 ENCOUNTER — Ambulatory Visit (INDEPENDENT_AMBULATORY_CARE_PROVIDER_SITE_OTHER): Payer: Medicare Other | Admitting: Cardiovascular Disease

## 2021-04-02 VITALS — BP 114/64 | HR 64 | Ht 63.0 in | Wt 162.6 lb

## 2021-04-02 DIAGNOSIS — R0602 Shortness of breath: Secondary | ICD-10-CM | POA: Diagnosis not present

## 2021-04-02 DIAGNOSIS — I4819 Other persistent atrial fibrillation: Secondary | ICD-10-CM | POA: Diagnosis not present

## 2021-04-02 DIAGNOSIS — I10 Essential (primary) hypertension: Secondary | ICD-10-CM | POA: Diagnosis not present

## 2021-04-02 DIAGNOSIS — R5382 Chronic fatigue, unspecified: Secondary | ICD-10-CM

## 2021-04-02 LAB — CBC
Hematocrit: 29.1 % — ABNORMAL LOW (ref 34.0–46.6)
Hemoglobin: 8.6 g/dL — ABNORMAL LOW (ref 11.1–15.9)
MCH: 22.4 pg — ABNORMAL LOW (ref 26.6–33.0)
MCHC: 29.6 g/dL — ABNORMAL LOW (ref 31.5–35.7)
MCV: 76 fL — ABNORMAL LOW (ref 79–97)
Platelets: 359 10*3/uL (ref 150–450)
RBC: 3.84 x10E6/uL (ref 3.77–5.28)
RDW: 17.2 % — ABNORMAL HIGH (ref 11.7–15.4)
WBC: 6.7 10*3/uL (ref 3.4–10.8)

## 2021-04-02 MED ORDER — METOPROLOL SUCCINATE ER 50 MG PO TB24: 50.0000 mg | ORAL_TABLET | Freq: Every day | ORAL | 3 refills | Status: DC

## 2021-04-02 NOTE — Patient Instructions (Signed)
Medication Instructions:  STOP lisinopril Start Metoprolol Succinate to 50 mg daily   *If you need a refill on your cardiac medications before your next appointment, please call your pharmacy*   Lab Work: CBC today   If you have labs (blood work) drawn today and your tests are completely normal, you will receive your results only by: [place] (if you have MyChart) OR A paper copy in the mail If you have any lab test that is abnormal or we need to change your treatment, we will call you to review the results.   Follow-Up: At [facility], you and your health needs are our priority.  As part of our continuing mission to provide you with exceptional heart care, we have created designated Provider Care Teams.  These Care Teams include your primary Cardiologist (physician) and Advanced Practice Providers (APPs -  Physician Assistants and Nurse Practitioners) who all work together to provide you with the care you need, when you need it.  We recommend signing up for the patient portal called "MyChart".  Sign up information is provided on this After Visit Summary.  MyChart is used to connect with patients for Virtual Visits (Telemedicine).  Patients are able to view lab/test results, encounter notes, upcoming appointments, etc.  Non-urgent messages can be sent to your provider as well.   To learn more about what you can do with MyChart, go to [email or web address].    Your next appointment:   3 month(s)  The format for your next appointment:   In Person  Provider:   Eleonore Chiquito, MD   Other Instructions Please see your GI doctor in regards to your anemia.

## 2021-04-04 ENCOUNTER — Telehealth: Payer: Self-pay | Admitting: Cardiovascular Disease

## 2021-04-04 NOTE — Telephone Encounter (Signed)
Hemoglobin down to 8.6. Instructed to stop her eliquis. She is not light headed or dizzy, but still fatigued and SOB. If symptoms worsen, she was instructed to go to the ER. No overt bleeding noted. Instructed her to contact her PCP Monday morning.   We will continue with rate control plan until anemia has resolved. Cannot undergo DCCV with worsening anemia and interruption of anticoagulation.   [place] T. Audie Box, MD, [place]  [address] [place], [place] [address] [phone]  12:10 PM

## 2021-04-05 DIAGNOSIS — R1013 Epigastric pain: Secondary | ICD-10-CM | POA: Diagnosis not present

## 2021-04-05 DIAGNOSIS — D649 Anemia, unspecified: Secondary | ICD-10-CM | POA: Diagnosis not present

## 2021-04-05 NOTE — Telephone Encounter (Signed)
Noted. Thanks.

## 2021-04-08 DIAGNOSIS — D649 Anemia, unspecified: Secondary | ICD-10-CM | POA: Diagnosis not present

## 2021-04-09 DIAGNOSIS — Z20822 Contact with and (suspected) exposure to covid-19: Secondary | ICD-10-CM | POA: Diagnosis not present

## 2021-04-16 ENCOUNTER — Other Ambulatory Visit: Payer: Self-pay

## 2021-04-16 ENCOUNTER — Ambulatory Visit (INDEPENDENT_AMBULATORY_CARE_PROVIDER_SITE_OTHER): Payer: Medicare Other | Admitting: Internal Medicine

## 2021-04-16 ENCOUNTER — Telehealth: Payer: Self-pay

## 2021-04-16 ENCOUNTER — Encounter: Payer: Self-pay | Admitting: Internal Medicine

## 2021-04-16 VITALS — BP 130/60 | HR 88 | Ht 63.0 in | Wt 163.2 lb

## 2021-04-16 DIAGNOSIS — D509 Iron deficiency anemia, unspecified: Secondary | ICD-10-CM

## 2021-04-16 DIAGNOSIS — K219 Gastro-esophageal reflux disease without esophagitis: Secondary | ICD-10-CM

## 2021-04-16 DIAGNOSIS — R1319 Other dysphagia: Secondary | ICD-10-CM | POA: Diagnosis not present

## 2021-04-16 DIAGNOSIS — R634 Abnormal weight loss: Secondary | ICD-10-CM

## 2021-04-16 MED ORDER — NA SULFATE-K SULFATE-MG SULF 17.5-3.13-1.6 GM/177ML PO SOLN: 1.0000 | Freq: Once | ORAL | 0 refills | Status: AC

## 2021-04-16 NOTE — Telephone Encounter (Signed)
Danielle [name], please see Dr. Henrene Pastor visit note. He would like for to be set up for IV iron infusions asap.

## 2021-04-16 NOTE — Telephone Encounter (Signed)
Urgent Amb referral placed in epic to Hematology for pt to receive IV Iron asap.

## 2021-04-16 NOTE — Patient Instructions (Addendum)
If you are age 84 or older, your body mass index should be between 23-30. Your Body mass index is 28.91 kg/m. If this is out of the aforementioned range listed, please consider follow up with your Primary Care Provider.  he [facility] providers would like to encourage you to use [facility] to communicate with providers for non-urgent requests or questions.  Due to long hold times on the telephone, sending your provider a message by [facility] may be a faster and more efficient way to get a response.  Please allow 48 business hours for a response.  Please remember that this is for non-urgent requests.    We have sent the following medications to your pharmacy for you to pick up at your convenience: Suprep   We will have nurse to contact you regarding setting up IV iron infusion. If you have not heard from our office in 1 week, please call office and ask for Vaughan Basta -RN at [phone].   You have been scheduled for a colonoscopy. Please follow written instructions given to you at your visit today.  Please pick up your prep supplies at the pharmacy within the next 1-3 days. If you use inhalers (even only as needed), please bring them with you on the day of your procedure.  Due to recent changes in healthcare laws, you may see the results of your imaging and laboratory studies on MyChart before your provider has had a chance to review them.  We understand that in some cases there may be results that are confusing or concerning to you. Not all laboratory results come back in the same time frame and the provider may be waiting for multiple results in order to interpret others.  Please give [place] 48 hours in order for your provider to thoroughly review all the results before contacting the office for clarification of your results.     Thank you for choosing me and [facility].  Dr.John Henrene Pastor

## 2021-04-19 ENCOUNTER — Telehealth: Payer: Self-pay | Admitting: Cardiovascular Disease

## 2021-04-19 ENCOUNTER — Telehealth: Payer: Self-pay | Admitting: Internal Medicine

## 2021-04-19 ENCOUNTER — Encounter: Payer: Self-pay | Admitting: Internal Medicine

## 2021-04-19 NOTE — Telephone Encounter (Signed)
Received a new hem referral from Dr. Henrene Pastor. Pt has been cld and scheduled to see Dr. Julien Nordmann on 7/5 at 11:45am w/labs at 11:15am. Pt aware to arrive 15 minutes early.

## 2021-04-19 NOTE — Telephone Encounter (Signed)
has been taking metoprolol 25mg  one after lunch and one after dinner.. directions say take one a day but shes taken two by mistake...has a newer rx of metoprolol that is 50mg  and is supposed to take one tablet a day.. please advise.

## 2021-04-19 NOTE — Progress Notes (Signed)
HISTORY OF PRESENT ILLNESS:  Danielle [name] is a 86 y.o. female, retired Radio broadcast assistant, with multiple medical problems including insulin requiring diabetes mellitus, hypertension, hyperlipidemia, recently diagnosed atrial fibrillation for which she was on Eliquis, and GERD.  She is sent today by her primary care provider regarding iron deficiency anemia.  Review of outside records from February 19, 2021 shows anemia with hemoglobin 10.0.  MCV 72.2.  Iron saturation 7%.  Subsequent Hemoccult testing May 2022 was negative.  Due to anemia and her Eliquis has been stopped.  She denies melena or hematochezia.  She does report a history of peptic ulcer disease.  She does take omeprazole 20 mg daily which was recently increased to 40 mg daily.  She reports to me that she has severe gastroenteritis with vomiting and diarrhea in February 2022.  Since that time decreased appetite and 16 pound weight loss.  No abdominal pain.  She does report some intermittent solid food dysphagia.  She was placed on oral iron with does not tolerate this well.  Taking it about 4 times per week.  Follow-up laboratories April 02, 2021 revealed hemoglobin 8.6 with MCV 76.  She is followed by Dr. Audie Box from cardiology.  Her CHA2DS2-VASc score is 5.  The patient does have a family history of colon cancer in her mother around age 61 and sister around age 54.  She is undergone multiple prior colonoscopies including 1997, 2002, 2007, most recently September 2016.  The most recent examination revealed diminutive adenomas and moderate diverticulosis of the right and left colon.  Follow-up in 5 years to be considered if the patient was medically fit and willing.  She continues to be active.  GFR is normal.  REVIEW OF SYSTEMS:  All non-GI ROS negative unless otherwise stated in the HPI except for sinus and allergy trouble, anxiety, arthritis, back pain, visual change, fatigue, hearing problems, irregular heartbeat, itching, muscle cramps, shortness of  breath, sleeping problems, excessive urination, urinary leakage  Past Medical History:  Diagnosis Date   Anemia    Anxiety    Arthritis    Atrial fibrillation ([place])    Cataract    bilateral   Colon polyps    Diabetes ([place])    Diabetes mellitus without complication ([place])    type two   GERD (gastroesophageal reflux disease)    Hypercholesteremia    Hypertension    Iron deficiency anemia    Pancreatitis    Peripheral neuropathy    Squamous cell skin cancer     Past Surgical History:  Procedure Laterality Date   APPENDECTOMY  1957   BREAST EXCISIONAL BIOPSY Right 1966   CATARACT EXTRACTION, BILATERAL     CHOLECYSTECTOMY  10/2008   lump removed right breast  1958   bENIGN   right ankle plate  2836   VAGINAL HYSTERECTOMY  1978    Social History [place]  reports that she has never smoked. She has never used smokeless tobacco. She reports that she does not drink alcohol and does not use drugs.  family history includes Atrial fibrillation in her brother; CAD in her sister; Cerebral aneurysm in her father; Colon cancer in her sister and sister; Colon cancer (age of onset: 42) in her mother; Dementia in her sister; Diabetes in her mother; Heart attack in her brother, brother, and sister; Hypertension in her mother; Parkinsonism in her sister; Stroke in her mother; Throat cancer in her brother; Uterine cancer in her sister.  Allergies  Allergen Reactions   Amoxicillin  Swelling and Rash    Throat swells   Hydrochlorothiazide Other (See Comments)    HA and Leg cramps   Cleocin [Clindamycin Hcl] Rash   Codeine Rash    And edema   Penicillins Rash       PHYSICAL EXAMINATION: Vital signs: BP 130/60   Pulse 88 Comment: irregular  Ht 5\' 3"  (1.6 m)   Wt 163 lb 3.2 oz (74 kg)   BMI 28.91 kg/m   Constitutional: generally well-appearing, no acute distress Psychiatric: alert and oriented x3, cooperative Eyes: extraocular movements intact, anicteric, conjunctiva  pink Mouth: oral pharynx moist, no lesions Neck: supple no lymphadenopathy Cardiovascular: heart regular rate and rhythm, no murmur Lungs: clear to auscultation bilaterally Abdomen: soft, nontender, nondistended, no obvious ascites, no peritoneal signs, normal bowel sounds, no organomegaly Rectal: Deferred until colonoscopy Extremities: no clubbing, cyanosis, or lower extremity edema bilaterally Skin: no lesions on visible extremities Neuro: No focal deficits.  Cranial nerves intact  ASSESSMENT:  1.  Iron deficiency anemia without overt or occult bleeding.  GI mucosal lesion 2.  GERD.  On omeprazole 40 mg daily.  Now with dysphagia 3.  Strong family history of colon cancer and personal history of adenomatous colon polyps.  Last colonoscopy September 2016 4.  Diverticulosis 5.  Multiple medical problems including insulin requiring diabetes and recent problems with paroxysmal atrial fibrillation for which Eliquis is being held 6.  Difficulty tolerating oral iron 7.  Weight loss 8.  Intermittent solid food dysphagia.  Rule out stricture.  Rule out neoplasm  PLAN:  1.  Arrange for Feraheme IV iron infusion 2.  Continue PPI at current doses 3.  Schedule colonoscopy and upper endoscopy to evaluate iron deficiency anemia.  The patient is HIGH RISK given her age and comorbidities.The nature of the procedure, as well as the risks, benefits, and alternatives were carefully and thoroughly reviewed with the patient. Ample time for discussion and questions allowed. The patient understood, was satisfied, and agreed to proceed.  4.  Decrease bedtime Lantus insulin from 20-24 units to 16 units in anticipation of her procedures. 5.  Hold metformin the day of the procedures 6.  Further recommendations after the above A total time of 60 minutes was spent preparing to see the patient, reviewing outside tests, reviewing outside history, obtaining comprehensive history, performing comprehensive physical  examination, counseling the patient regarding her multiple above listed issues, ordering medications and advanced endoscopic procedures, adjusting medications, and documenting clinical information in the health record

## 2021-04-19 NOTE — Telephone Encounter (Signed)
Returned call to patient-has been taking Toprol 25 mg BID instead of 50 mg daily.  She realized this last night and thought she was taking the wrong dosage.     Advised at last OV, Toprol was increased to 50 mg daily.    Advised when she started new prescription, this will be a 50 mg tablet and to only take 1 tablet daily.    Patient aware and verbalized understanding.

## 2021-04-20 NOTE — Telephone Encounter (Signed)
Inbound call from patient requesting a call from a nurse please.  She was under the impression infusion was for today, not 04/27/21.  Please advise.

## 2021-04-20 NOTE — Telephone Encounter (Signed)
Spoke with patient, patient made aware to keep appts as currently scheduled. Pt verbalized understanding and nothing further needed at this time.

## 2021-04-20 NOTE — Telephone Encounter (Signed)
Spoke with patient. Patient states she was told by Dr Henrene Pastor to have Iron infusion Prior to Colon/Endo. Patient scheduled 6/30 for Colon/Endo and Iron infusion currently scheduled for 7/5. Please advise if infusion needs to be before procedure. Thank You

## 2021-04-22 ENCOUNTER — Encounter: Payer: Self-pay | Admitting: Internal Medicine

## 2021-04-22 ENCOUNTER — Other Ambulatory Visit: Payer: Self-pay

## 2021-04-22 ENCOUNTER — Ambulatory Visit (AMBULATORY_SURGERY_CENTER): Payer: Medicare Other | Admitting: Internal Medicine

## 2021-04-22 VITALS — BP 107/65 | HR 115 | Temp 98.3°F | Resp 11 | Ht 63.0 in | Wt 163.0 lb

## 2021-04-22 DIAGNOSIS — K56600 Partial intestinal obstruction, unspecified as to cause: Secondary | ICD-10-CM

## 2021-04-22 DIAGNOSIS — K449 Diaphragmatic hernia without obstruction or gangrene: Secondary | ICD-10-CM

## 2021-04-22 DIAGNOSIS — D509 Iron deficiency anemia, unspecified: Secondary | ICD-10-CM

## 2021-04-22 DIAGNOSIS — R1319 Other dysphagia: Secondary | ICD-10-CM

## 2021-04-22 DIAGNOSIS — K5730 Diverticulosis of large intestine without perforation or abscess without bleeding: Secondary | ICD-10-CM

## 2021-04-22 DIAGNOSIS — R1310 Dysphagia, unspecified: Secondary | ICD-10-CM | POA: Diagnosis not present

## 2021-04-22 DIAGNOSIS — K219 Gastro-esophageal reflux disease without esophagitis: Secondary | ICD-10-CM

## 2021-04-22 MED ORDER — SODIUM CHLORIDE 0.9 % IV SOLN: 500.0000 mL | Freq: Once | INTRAVENOUS | Status: DC

## 2021-04-22 NOTE — Op Note (Signed)
[place] Patient Name: Danielle [name] Procedure Date: 04/22/2021 4:42 PM MRN: [id number] Endoscopist: Docia Chuck. Henrene Pastor , MD Age: 86 Referring MD:  Date of Birth: 1935/06/04 Gender: Female Account #: [id number] Procedure:                Upper GI endoscopy with biopsies Indications:              Iron deficiency anemia Medicines:                Monitored Anesthesia Care Procedure:                Pre-Anesthesia Assessment:                           - Prior to the procedure, a History and Physical                            was performed, and patient medications and                            allergies were reviewed. The patient's tolerance of                            previous anesthesia was also reviewed. The risks                            and benefits of the procedure and the sedation                            options and risks were discussed with the patient.                            All questions were answered, and informed consent                            was obtained. Prior Anticoagulants: The patient has                            taken no previous anticoagulant or antiplatelet                            agents. ASA Grade Assessment: III - A patient with                            severe systemic disease. After reviewing the risks                            and benefits, the patient was deemed in                            satisfactory condition to undergo the procedure.                           After obtaining informed consent, the endoscope was  passed under direct vision. Throughout the                            procedure, the patient's blood pressure, pulse, and                            oxygen saturations were monitored continuously. The                            GIF [id number] #[id number] was introduced through the                            mouth, and advanced to the second part of duodenum.                            The upper GI  endoscopy was accomplished without                            difficulty. The patient tolerated the procedure                            well. Scope In: Scope Out: Findings:                 The esophagus was normal.                           The stomach was normal save sliding hiatal hernia.                           The examined duodenum was normal. Biopsies for                            histology were taken with a cold forceps for                            evaluation of celiac disease.                           The cardia and gastric fundus were normal on                            retroflexion. Complications:            No immediate complications. Estimated Blood Loss:     Estimated blood loss: none. Impression:               - Normal esophagus.                           - Normal stomach.                           - Normal examined duodenum. Biopsied. Recommendation:           1. See colonoscopy report  2. Resume previous diet medications                           3. Keep plans for IV iron infusion next week as                            scheduled                           4. CBC and ferritin level in 2 weeks. You can have                            that blood work in my office.                           5. We will want to monitor your blood counts over                            time. Should you develop recurrent iron deficiency                            anemia we could consider small bowel capsule                            endoscopy. Docia Chuck. Henrene Pastor, MD 04/22/2021 5:45:14 PM This report has been signed electronically.

## 2021-04-22 NOTE — Op Note (Signed)
[place] Patient Name: Danielle [name] Procedure Date: 04/22/2021 4:46 PM MRN: [id number] Endoscopist: Docia Chuck. Henrene Pastor , MD Age: 86 Referring MD:  Date of Birth: 12/08/1934 Gender: Female Account #: [id number] Procedure:                Colonoscopy Indications:              Iron deficiency anemia. Multiple prior                            colonoscopies. Last examination 2016 Medicines:                Monitored Anesthesia Care Procedure:                Pre-Anesthesia Assessment:                           - Prior to the procedure, a History and Physical                            was performed, and patient medications and                            allergies were reviewed. The patient's tolerance of                            previous anesthesia was also reviewed. The risks                            and benefits of the procedure and the sedation                            options and risks were discussed with the patient.                            All questions were answered, and informed consent                            was obtained. Prior Anticoagulants: The patient has                            taken no previous anticoagulant or antiplatelet                            agents. ASA Grade Assessment: III - A patient with                            severe systemic disease. After reviewing the risks                            and benefits, the patient was deemed in                            satisfactory condition to undergo the procedure.  After obtaining informed consent, the colonoscope                            was passed under direct vision. Throughout the                            procedure, the patient's blood pressure, pulse, and                            oxygen saturations were monitored continuously. The                            Olympus [id number]) Colonoscope was                            introduced through the anus and  advanced to the the                            cecum, identified by appendiceal orifice and                            ileocecal valve. The ileocecal valve, appendiceal                            orifice, and rectum were photographed. The quality                            of the bowel preparation was excellent. The                            colonoscopy was performed without difficulty. The                            patient tolerated the procedure well. The bowel                            preparation used was SUPREP via split dose                            instruction. Scope In: 5:04:39 PM Scope Out: 5:26:37 PM Scope Withdrawal Time: 0 hours 10 minutes 52 seconds  Total Procedure Duration: 0 hours 21 minutes 58 seconds  Findings:                 Multiple diverticula were found in the sigmoid                            colon. There was sigmoid stenosis.                           The exam was otherwise without abnormality on                            direct and retroflexion views. Complications:            No  immediate complications. Estimated blood loss:                            None. Estimated Blood Loss:     Estimated blood loss: none. Impression:               - Diverticulosis in the sigmoid colon. Associated                            sigmoid stenosis                           - The examination was otherwise normal on direct                            and retroflexion views.                           - No specimens collected. Recommendation:           - Repeat colonoscopy is not recommended for                            surveillance.                           - Patient has a contact number available for                            emergencies. The signs and symptoms of potential                            delayed complications were discussed with the                            patient. Return to normal activities tomorrow.                            Written discharge  instructions were provided to the                            patient.                           - Resume previous diet.                           - Continue present medications.                           - EGD today. Please see report for findings and                            final recommendations [name], MD 04/22/2021 5:29:17 PM This report has been signed electronically.

## 2021-04-22 NOTE — Patient Instructions (Signed)
YOU HAD AN ENDOSCOPIC PROCEDURE TODAY AT THE [facility]:   Refer to the procedure report that was given to you for any specific questions about what was found during the examination.  If the procedure report does not answer your questions, please call your gastroenterologist to clarify.  If you requested that your care partner not be given the details of your procedure findings, then the procedure report has been included in a sealed envelope for you to review at your convenience later.  YOU SHOULD EXPECT: Some feelings of bloating in the abdomen. Passage of more gas than usual.  Walking can help get rid of the air that was put into your GI tract during the procedure and reduce the bloating. If you had a lower endoscopy (such as a colonoscopy or flexible sigmoidoscopy) you may notice spotting of blood in your stool or on the toilet paper. If you underwent a bowel prep for your procedure, you may not have a normal bowel movement for a few days.  Please Note:  You might notice some irritation and congestion in your nose or some drainage.  This is from the oxygen used during your procedure.  There is no need for concern and it should clear up in a day or so.  SYMPTOMS TO REPORT IMMEDIATELY:   Following lower endoscopy (colonoscopy or flexible sigmoidoscopy):  Excessive amounts of blood in the stool  Significant tenderness or worsening of abdominal pains  Swelling of the abdomen that is new, acute  Fever of 100F or higher   Following upper endoscopy (EGD)  Vomiting of blood or coffee ground material  New chest pain or pain under the shoulder blades  Painful or persistently difficult swallowing  New shortness of breath  Fever of 100F or higher  Black, tarry-looking stools  For urgent or emergent issues, a gastroenterologist can be reached at any hour by calling [phone]. Do not use MyChart messaging for urgent concerns.    DIET:  We do recommend a small meal at first, but  then you may proceed to your regular diet.  Drink plenty of fluids but you should avoid alcoholic beverages for 24 hours.  ACTIVITY:  You should plan to take it easy for the rest of today and you should NOT DRIVE or use heavy machinery until tomorrow (because of the sedation medicines used during the test).    FOLLOW UP: Our staff will call the number listed on your records 48-72 hours following your procedure to check on you and address any questions or concerns that you may have regarding the information given to you following your procedure. If we do not reach you, we will leave a message.  We will attempt to reach you two times.  During this call, we will ask if you have developed any symptoms of COVID 19. If you develop any symptoms (ie: fever, flu-like symptoms, shortness of breath, cough etc.) before then, please call [phone].  If you test positive for Covid 19 in the 2 weeks post procedure, please call and report this information to us.    If any biopsies were taken you will be contacted by phone or by letter within the next 1-3 weeks.  Please call us at [phone] if you have not heard about the biopsies in 3 weeks.    SIGNATURES/CONFIDENTIALITY: You and/or your care partner have signed paperwork which will be entered into your electronic medical record.  These signatures attest to the fact that that the information above on   your After Visit Summary has been reviewed and is understood.  Full responsibility of the confidentiality of this discharge information lies with you and/or your care-partner. 

## 2021-04-22 NOTE — Progress Notes (Signed)
Medical history reviewed with no changes noted. VS assessed by C.W 

## 2021-04-22 NOTE — Progress Notes (Signed)
A/ox3, pleased with MAC, report to RN 

## 2021-04-22 NOTE — Progress Notes (Signed)
Called to room to assist during endoscopic procedure.  Patient ID and intended procedure confirmed with present staff. Received instructions for my participation in the procedure from the performing physician.  

## 2021-04-26 ENCOUNTER — Other Ambulatory Visit: Payer: Self-pay | Admitting: Internal Medicine

## 2021-04-26 DIAGNOSIS — D539 Nutritional anemia, unspecified: Secondary | ICD-10-CM

## 2021-04-27 ENCOUNTER — Other Ambulatory Visit: Payer: Self-pay

## 2021-04-27 ENCOUNTER — Inpatient Hospital Stay: Payer: Medicare Other

## 2021-04-27 ENCOUNTER — Telehealth: Payer: Self-pay

## 2021-04-27 ENCOUNTER — Inpatient Hospital Stay: Payer: Medicare Other | Attending: Internal Medicine | Admitting: Internal Medicine

## 2021-04-27 VITALS — BP 130/78 | HR 116 | Temp 97.3°F | Resp 19 | Ht 63.0 in | Wt 166.9 lb

## 2021-04-27 DIAGNOSIS — I10 Essential (primary) hypertension: Secondary | ICD-10-CM | POA: Insufficient documentation

## 2021-04-27 DIAGNOSIS — I4891 Unspecified atrial fibrillation: Secondary | ICD-10-CM | POA: Insufficient documentation

## 2021-04-27 DIAGNOSIS — E119 Type 2 diabetes mellitus without complications: Secondary | ICD-10-CM | POA: Insufficient documentation

## 2021-04-27 DIAGNOSIS — E785 Hyperlipidemia, unspecified: Secondary | ICD-10-CM | POA: Insufficient documentation

## 2021-04-27 DIAGNOSIS — Z794 Long term (current) use of insulin: Secondary | ICD-10-CM | POA: Insufficient documentation

## 2021-04-27 DIAGNOSIS — Z7901 Long term (current) use of anticoagulants: Secondary | ICD-10-CM | POA: Diagnosis not present

## 2021-04-27 DIAGNOSIS — Z79899 Other long term (current) drug therapy: Secondary | ICD-10-CM | POA: Diagnosis not present

## 2021-04-27 DIAGNOSIS — E538 Deficiency of other specified B group vitamins: Secondary | ICD-10-CM | POA: Diagnosis not present

## 2021-04-27 DIAGNOSIS — D500 Iron deficiency anemia secondary to blood loss (chronic): Secondary | ICD-10-CM | POA: Insufficient documentation

## 2021-04-27 DIAGNOSIS — D518 Other vitamin B12 deficiency anemias: Secondary | ICD-10-CM

## 2021-04-27 DIAGNOSIS — D539 Nutritional anemia, unspecified: Secondary | ICD-10-CM

## 2021-04-27 DIAGNOSIS — D509 Iron deficiency anemia, unspecified: Secondary | ICD-10-CM | POA: Diagnosis not present

## 2021-04-27 LAB — CBC WITH DIFFERENTIAL (CANCER CENTER ONLY)
Abs Immature Granulocytes: 0.02 10*3/uL (ref 0.00–0.07)
Basophils Absolute: 0.1 10*3/uL (ref 0.0–0.1)
Basophils Relative: 1 %
Eosinophils Absolute: 0.2 10*3/uL (ref 0.0–0.5)
Eosinophils Relative: 2 %
HCT: 28.4 % — ABNORMAL LOW (ref 36.0–46.0)
Hemoglobin: 8.6 g/dL — ABNORMAL LOW (ref 12.0–15.0)
Immature Granulocytes: 0 %
Lymphocytes Relative: 23 %
Lymphs Abs: 1.7 10*3/uL (ref 0.7–4.0)
MCH: 22.9 pg — ABNORMAL LOW (ref 26.0–34.0)
MCHC: 30.3 g/dL (ref 30.0–36.0)
MCV: 75.5 fL — ABNORMAL LOW (ref 80.0–100.0)
Monocytes Absolute: 0.5 10*3/uL (ref 0.1–1.0)
Monocytes Relative: 7 %
Neutro Abs: 5 10*3/uL (ref 1.7–7.7)
Neutrophils Relative %: 67 %
Platelet Count: 322 10*3/uL (ref 150–400)
RBC: 3.76 MIL/uL — ABNORMAL LOW (ref 3.87–5.11)
RDW: 18.4 % — ABNORMAL HIGH (ref 11.5–15.5)
WBC Count: 7.5 10*3/uL (ref 4.0–10.5)
nRBC: 0 % (ref 0.0–0.2)

## 2021-04-27 LAB — CMP (CANCER CENTER ONLY)
ALT: 27 U/L (ref 0–44)
AST: 28 U/L (ref 15–41)
Albumin: 3.4 g/dL — ABNORMAL LOW (ref 3.5–5.0)
Alkaline Phosphatase: 66 U/L (ref 38–126)
Anion gap: 10 (ref 5–15)
BUN: 12 mg/dL (ref 8–23)
CO2: 24 mmol/L (ref 22–32)
Calcium: 8.9 mg/dL (ref 8.9–10.3)
Chloride: 106 mmol/L (ref 98–111)
Creatinine: 0.91 mg/dL (ref 0.44–1.00)
GFR, Estimated: >60 mL/min (ref >60)
Glucose, Bld: 150 mg/dL — ABNORMAL HIGH (ref 70–99)
Potassium: 4.4 mmol/L (ref 3.5–5.1)
Sodium: 140 mmol/L (ref 135–145)
Total Bilirubin: 0.7 mg/dL (ref 0.3–1.2)
Total Protein: 6.8 g/dL (ref 6.5–8.1)

## 2021-04-27 LAB — VITAMIN B12: Vitamin B-12: 121 pg/mL — ABNORMAL LOW (ref 180–914)

## 2021-04-27 LAB — FOLATE: Folate: 14.5 ng/mL (ref >5.9)

## 2021-04-27 LAB — RETICULOCYTES
Immature Retic Fract: 26.2 % — ABNORMAL HIGH (ref 2.3–15.9)
RBC.: 3.77 MIL/uL — ABNORMAL LOW (ref 3.87–5.11)
Retic Count, Absolute: 70.5 10*3/uL (ref 19.0–186.0)
Retic Ct Pct: 1.9 % (ref 0.4–3.1)

## 2021-04-27 LAB — IRON AND TIBC
Iron: 17 ug/dL — ABNORMAL LOW (ref 41–142)
Saturation Ratios: 5 % — ABNORMAL LOW (ref 21–57)
TIBC: 353 ug/dL (ref 236–444)
UIBC: 336 ug/dL (ref 120–384)

## 2021-04-27 LAB — LACTATE DEHYDROGENASE: LDH: 163 U/L (ref 98–192)

## 2021-04-27 NOTE — Progress Notes (Signed)
[place] [phone]   [phone]  CONSULT NOTE  REFERRING PHYSICIAN: Dr. Sela Hilding  REASON FOR CONSULTATION:  86 years old white female with persistent iron deficiency anemia  HPI ISYS TIETJE is a 86 y.o. female with past medical history significant for multiple medical problems including history of hypertension, diabetes mellitus, dyslipidemia, pancreatitis, peripheral neuropathy, atrial fibrillation, osteoarthritis, and anxiety and GERD.  The patient was seen by her primary care physician for routine examination and she was noted to have irregular heart rate.  Lab work at that time also showed persistent anemia.  The patient was referred to cardiology for management of her atrial fibrillation.  She was also referred to Dr. Henrene Pastor with gastroenterology to rule out gastrointestinal bleeding.  The patient underwent upper endoscopy as well as colonoscopy on April 22, 2021 and these were normal with no clear evidence for gastrointestinal bleeding based on the above tests but she did not have a capsule endoscopy and her stool for Hemoccult was negative.  She was referred to me today for evaluation and recommendation regarding her condition.  She started taking over-the-counter iron tablet few weeks ago but she takes it with omeprazole for indigestion.  She also takes multivitamin. When seen today she continues to complain of fatigue and feeling tired most of the time.  She has occasional dizzy spells as well as shortness of breath.  She denied having any chest pain, cough or hemoptysis.  She denied having any other bleeding, bruises or ecchymosis.  She was diagnosed with COVID 19 in April 2022 and she also had an episode of viral gastroenteritis 3 weeks before.  She had lack of appetite and was mostly on liquid for several weeks.  She denied having any current nausea, vomiting, diarrhea or constipation.  She has no headache or visual changes. Family  history significant for mother with colon cancer, stroke, hypertension and diabetes.  Sister had colon cancer.  Brother had throat cancer and father had cerebral aneurysm. The patient is a widow and has 3 children a son and 2 daughters.  She worked as a Higher education careers adviser.  She has no history of smoking, alcohol or drug abuse.  HPI  Past Medical History:  Diagnosis Date   Anemia    Anxiety    Arthritis    Atrial fibrillation (HCC)    Cataract    bilateral   Colon polyps    Diabetes ([place])    Diabetes mellitus without complication (HCC)    type two   GERD (gastroesophageal reflux disease)    Hypercholesteremia    Hypertension    Iron deficiency anemia    Pancreatitis    Peripheral neuropathy    Squamous cell skin cancer     Past Surgical History:  Procedure Laterality Date   APPENDECTOMY  1957   BREAST EXCISIONAL BIOPSY Right 1966   CATARACT EXTRACTION, BILATERAL     CHOLECYSTECTOMY  10/2008   lump removed right breast  1958   bENIGN   right ankle plate  [id number]   VAGINAL HYSTERECTOMY  1978    Family History  Problem Relation Age of Onset   Hypertension Mother    Stroke Mother    Diabetes Mother    Colon cancer Mother 87   Cerebral aneurysm Father    Colon cancer Sister        in her 42s   CAD Sister    Heart attack Sister    Uterine cancer Sister  in her late 75s   Colon cancer Sister        in her 96s   Parkinsonism Sister    Dementia Sister    Heart attack Brother    Heart attack Brother    Throat cancer Brother    Atrial fibrillation Brother     Social History Social History   Tobacco Use   Smoking status: Never   Smokeless tobacco: Never  Substance Use Topics   Alcohol use: No    Alcohol/week: 0.0 standard drinks   Drug use: No    Allergies  Allergen Reactions   Amoxicillin Swelling and Rash    Throat swells   Hydrochlorothiazide Other (See Comments)    HA and Leg cramps   Cleocin [Clindamycin Hcl] Rash   Codeine  Rash    And edema   Penicillins Rash    Current Outpatient Medications  Medication Sig Dispense Refill   atorvastatin (LIPITOR) 20 MG tablet Take 10 mg by mouth daily.     calcium carbonate (OSCAL) 1500 (600 Ca) MG TABS tablet Take 600 mg of elemental calcium by mouth 2 (two) times daily with a meal.     calcium carbonate (TUMS - DOSED IN MG ELEMENTAL CALCIUM) 500 MG chewable tablet Chew 2 tablets by mouth as needed for indigestion or heartburn.     Cholecalciferol (VITAMIN D3) 1000 UNITS CAPS Take 1,000 Units by mouth in the morning and at bedtime.     FERROUS SULFATE ER PO Take by mouth.     gabapentin (NEURONTIN) 300 MG capsule Take 900 mg by mouth at bedtime.     Glucosamine-Chondroit-Vit C-Mn (GLUCOSAMINE CHONDR 1500 COMPLX PO) Take 2 capsules by mouth in the morning and at bedtime.     insulin glargine (LANTUS) 100 UNIT/ML injection Inject 22-24 Units into the skin at bedtime.     metFORMIN (GLUMETZA) 1000 MG (MOD) 24 hr tablet Take 1,000 mg by mouth 2 (two) times daily with a meal.     metoprolol succinate (TOPROL-XL) 50 MG 24 hr tablet Take 1 tablet (50 mg total) by mouth daily. Take with or immediately following a meal. 90 tablet 3   omeprazole (PRILOSEC) 40 MG capsule Take 1 capsule by mouth daily.     No current facility-administered medications for this visit.    Review of Systems  Constitutional: positive for fatigue and weight loss Eyes: negative Ears, nose, mouth, throat, and face: negative Respiratory: positive for dyspnea on exertion Cardiovascular: negative Gastrointestinal: negative Genitourinary:negative Integument/breast: negative Hematologic/lymphatic: negative Musculoskeletal:negative Neurological: positive for dizziness Behavioral/Psych: negative Endocrine: negative Allergic/Immunologic: negative  Physical Exam  WVP:XTGGY, healthy, no distress, well nourished, and well developed SKIN: skin color, texture, turgor are normal, no rashes or significant  lesions HEAD: Normocephalic, No masses, lesions, tenderness or abnormalities EYES: normal, PERRLA, Conjunctiva are pink and non-injected EARS: External ears normal, Canals clear OROPHARYNX:no exudate, no erythema, and lips, buccal mucosa, and tongue normal  NECK: supple, no adenopathy, no JVD LYMPH:  no palpable lymphadenopathy, no hepatosplenomegaly BREAST:not examined LUNGS: clear to auscultation , and palpation HEART: regular rate & rhythm, no murmurs, and no gallops ABDOMEN:abdomen soft, non-tender, normal bowel sounds, and no masses or organomegaly BACK: Back symmetric, no curvature., No CVA tenderness EXTREMITIES:no joint deformities, effusion, or inflammation, no edema  NEURO: alert & oriented x 3 with fluent speech, no focal motor/sensory deficits  PERFORMANCE STATUS: ECOG 1  LABORATORY DATA: Lab Results  Component Value Date   WBC 7.5 04/27/2021   HGB 8.6 (L) 04/27/2021  HCT 28.4 (L) 04/27/2021   MCV 75.5 (L) 04/27/2021   PLT 322 04/27/2021      Chemistry      Component Value Date/Time   NA 140 04/27/2021 1112   NA 139 02/26/2021 1100   K 4.4 04/27/2021 1112   CL 106 04/27/2021 1112   CO2 24 04/27/2021 1112   BUN 12 04/27/2021 1112   BUN 12 02/26/2021 1100   CREATININE 0.91 04/27/2021 1112      Component Value Date/Time   CALCIUM 8.9 04/27/2021 1112   ALKPHOS 66 04/27/2021 1112   AST 28 04/27/2021 1112   ALT 27 04/27/2021 1112   BILITOT 0.7 04/27/2021 1112       RADIOGRAPHIC STUDIES: No results found.  ASSESSMENT: This is a very pleasant 86 years old white female with persistent microcytic anemia secondary to iron deficiency of unclear etiology but could be secondary to malabsorption or occult gastrointestinal bleeding.  She underwent upper endoscopy and colonoscopy that were unremarkable.   PLAN: I had a lengthy discussion with the patient today about her condition and treatment options.  I order several studies today including repeat CBC that showed  persistent anemia with hemoglobin of 8.6 and hematocrit 28.4%.  Her MCV was 75.5.  Serum iron was 17 with iron saturation of 5%.  She was also found to have low vitamin B12 level of 121.  She has normal folate level. Other pending lab results include serum protein electrophoresis with immunofixation, and erythropoietin level I had a lengthy discussion with the patient today about her treatment options and I recommended for her to proceed with iron infusion with Venofer 300 mg IV weekly for 3 weeks. Because of the low vitamin B12 level, I will arrange for the patient to receive weekly vitamin B12 injection for the first 4 weeks and then monthly. I will see her back for follow-up visit in 6 weeks for evaluation with repeat blood work. The patient was advised to call immediately if she has any other concerning symptoms in the interval. The patient voices understanding of current disease status and treatment options and is in agreement with the current care plan.  All questions were answered. The patient knows to call the clinic with any problems, questions or concerns. We can certainly see the patient much sooner if necessary.  Thank you so much for allowing me to participate in the care of Danielle [name]. I will continue to follow up the patient with you and assist in her care.  The total time spent in the appointment was 60 minutes.  Disclaimer: This note was dictated with voice recognition software. Similar sounding words can inadvertently be transcribed and may not be corrected upon review.   Eilleen Kempf April 27, 2021, 12:18 PM

## 2021-04-27 NOTE — Telephone Encounter (Signed)
  Follow up Call-  Call back number 04/22/2021  Post procedure Call Back phone  # [phone]  Permission to leave phone message Yes  Some recent data might be hidden     Patient questions:  Do you have a fever, pain , or abdominal swelling? No. Pain Score  0 *  Have you tolerated food without any problems? Yes.    Have you been able to return to your normal activities? Yes.    Do you have any questions about your discharge instructions: Diet   No. Medications  No. Follow up visit  No.  Do you have questions or concerns about your Care? No.  Actions: * If pain score is 4 or above: No action needed, pain <4.  Have you developed a fever since your procedure? no  2.   Have you had an respiratory symptoms (SOB or cough) since your procedure? no  3.   Have you tested positive for COVID 19 since your procedure no  4.   Have you had any family members/close contacts diagnosed with the COVID 19 since your procedure?  no   If yes to any of these questions please route to Joylene John, RN and Joella Prince, RN

## 2021-04-28 ENCOUNTER — Telehealth: Payer: Self-pay | Admitting: Cardiovascular Disease

## 2021-04-28 LAB — ERYTHROPOIETIN: Erythropoietin: 169 m[IU]/mL — ABNORMAL HIGH (ref 2.6–18.5)

## 2021-04-28 LAB — PROTEIN ELECTROPHORESIS, SERUM, WITH REFLEX
A/G Ratio: 1.3 (ref 0.7–1.7)
Albumin ELP: 3.4 g/dL (ref 2.9–4.4)
Alpha-1-Globulin: 0.2 g/dL (ref 0.0–0.4)
Alpha-2-Globulin: 0.7 g/dL (ref 0.4–1.0)
Beta Globulin: 1 g/dL (ref 0.7–1.3)
Gamma Globulin: 0.8 g/dL (ref 0.4–1.8)
Globulin, Total: 2.7 g/dL (ref 2.2–3.9)
Total Protein ELP: 6.1 g/dL (ref 6.0–8.5)

## 2021-04-28 NOTE — Telephone Encounter (Signed)
Called patient she had recent testing done- did not find any reasoning for her levels to be low. She had recent blood work and would like for you to review and make recommendations on medication.  Advised I would send over a message and call her back.

## 2021-04-28 NOTE — Telephone Encounter (Signed)
Patient called and wanted to know that since they didn't find anything wrong with the tests she had done, if she should resume taking Eliquis.

## 2021-04-29 ENCOUNTER — Telehealth: Payer: Self-pay | Admitting: Internal Medicine

## 2021-04-29 NOTE — Telephone Encounter (Signed)
Called patient back, she states her and her daughter are suppose to leave for a cruise on Sunday but she wants to know if you think this is a good idea or not. Advised I would route a message and call her back.   Thanks!

## 2021-04-29 NOTE — Telephone Encounter (Signed)
Called patient, and notified her. She was questioning if you can try to expedite the iron infusion she states they would not go ahead and do it. I advised her that it depends on what they have going on in that office and the process to do these, but I would make Dr.O'Neal aware of this request. Patient thankful for call back.

## 2021-04-29 NOTE — Telephone Encounter (Signed)
Scheduled appointment per 07/05 los. Left detailed message with both patient and daughter.

## 2021-04-30 ENCOUNTER — Other Ambulatory Visit: Payer: Self-pay

## 2021-04-30 ENCOUNTER — Encounter: Payer: Self-pay | Admitting: Internal Medicine

## 2021-04-30 ENCOUNTER — Inpatient Hospital Stay: Payer: Medicare Other

## 2021-04-30 VITALS — BP 150/104 | HR 109 | Temp 98.5°F | Resp 20 | Ht 63.0 in | Wt 165.0 lb

## 2021-04-30 DIAGNOSIS — I10 Essential (primary) hypertension: Secondary | ICD-10-CM | POA: Diagnosis not present

## 2021-04-30 DIAGNOSIS — E785 Hyperlipidemia, unspecified: Secondary | ICD-10-CM | POA: Diagnosis not present

## 2021-04-30 DIAGNOSIS — D500 Iron deficiency anemia secondary to blood loss (chronic): Secondary | ICD-10-CM

## 2021-04-30 DIAGNOSIS — Z7901 Long term (current) use of anticoagulants: Secondary | ICD-10-CM | POA: Diagnosis not present

## 2021-04-30 DIAGNOSIS — E119 Type 2 diabetes mellitus without complications: Secondary | ICD-10-CM | POA: Diagnosis not present

## 2021-04-30 DIAGNOSIS — I4891 Unspecified atrial fibrillation: Secondary | ICD-10-CM | POA: Diagnosis not present

## 2021-04-30 DIAGNOSIS — D509 Iron deficiency anemia, unspecified: Secondary | ICD-10-CM | POA: Diagnosis not present

## 2021-04-30 MED ORDER — SODIUM CHLORIDE 0.9 % IV SOLN
Freq: Once | INTRAVENOUS | Status: AC
  Filled 2021-04-30: qty 250

## 2021-04-30 MED ORDER — CYANOCOBALAMIN 1000 MCG/ML IJ SOLN
1000.0000 ug | Freq: Once | INTRAMUSCULAR | Status: AC
Start: 2021-04-30 — End: 2021-04-30
  Administered 2021-04-30: 1000 ug via INTRAMUSCULAR

## 2021-04-30 MED ORDER — CYANOCOBALAMIN 1000 MCG/ML IJ SOLN
INTRAMUSCULAR | Status: AC
  Filled 2021-04-30: qty 1

## 2021-04-30 MED ORDER — SODIUM CHLORIDE 0.9 % IV SOLN
300.0000 mg | Freq: Once | INTRAVENOUS | Status: AC
  Administered 2021-04-30: 300 mg via INTRAVENOUS
  Filled 2021-04-30: qty 300

## 2021-04-30 NOTE — Patient Instructions (Signed)

## 2021-05-10 ENCOUNTER — Other Ambulatory Visit: Payer: Self-pay

## 2021-05-10 ENCOUNTER — Telehealth: Payer: Self-pay | Admitting: Cardiovascular Disease

## 2021-05-10 ENCOUNTER — Inpatient Hospital Stay: Payer: Medicare Other

## 2021-05-10 VITALS — BP 152/102 | HR 113 | Temp 98.6°F | Resp 16

## 2021-05-10 DIAGNOSIS — E119 Type 2 diabetes mellitus without complications: Secondary | ICD-10-CM | POA: Diagnosis not present

## 2021-05-10 DIAGNOSIS — E785 Hyperlipidemia, unspecified: Secondary | ICD-10-CM | POA: Diagnosis not present

## 2021-05-10 DIAGNOSIS — D509 Iron deficiency anemia, unspecified: Secondary | ICD-10-CM | POA: Diagnosis not present

## 2021-05-10 DIAGNOSIS — Z7901 Long term (current) use of anticoagulants: Secondary | ICD-10-CM | POA: Diagnosis not present

## 2021-05-10 DIAGNOSIS — D500 Iron deficiency anemia secondary to blood loss (chronic): Secondary | ICD-10-CM

## 2021-05-10 DIAGNOSIS — I10 Essential (primary) hypertension: Secondary | ICD-10-CM | POA: Diagnosis not present

## 2021-05-10 DIAGNOSIS — I4891 Unspecified atrial fibrillation: Secondary | ICD-10-CM | POA: Diagnosis not present

## 2021-05-10 MED ORDER — SODIUM CHLORIDE 0.9 % IV SOLN
Freq: Once | INTRAVENOUS | Status: AC
  Filled 2021-05-10: qty 250

## 2021-05-10 MED ORDER — CYANOCOBALAMIN 1000 MCG/ML IJ SOLN
INTRAMUSCULAR | Status: AC
  Filled 2021-05-10: qty 1

## 2021-05-10 MED ORDER — RIVAROXABAN 20 MG PO TABS: 20.0000 mg | ORAL_TABLET | Freq: Every day | ORAL | 3 refills | Status: DC

## 2021-05-10 MED ORDER — CYANOCOBALAMIN 1000 MCG/ML IJ SOLN
1000.0000 ug | Freq: Once | INTRAMUSCULAR | Status: AC
  Administered 2021-05-10: 1000 ug via INTRAMUSCULAR

## 2021-05-10 MED ORDER — SODIUM CHLORIDE 0.9 % IV SOLN
300.0000 mg | Freq: Once | INTRAVENOUS | Status: AC
  Administered 2021-05-10: 300 mg via INTRAVENOUS
  Filled 2021-05-10: qty 300

## 2021-05-10 NOTE — Telephone Encounter (Signed)
Pt c/o medication issue:  1. Name of Medication: Eliquis   2. How are you currently taking this medication (dosage and times per day)? Patient is not sure of dosage, but she knows she take it twice daily.   3. Are you having a reaction (difficulty breathing--STAT)?   4. What is your medication issue? Cost  Patient went to get her first rx from the pharmacy and was told it would be $450 per month. The patient wanted to know if there was an alternate medication she could take that would cost her less money.

## 2021-05-10 NOTE — Patient Instructions (Signed)

## 2021-05-10 NOTE — Telephone Encounter (Signed)
RX sent

## 2021-05-10 NOTE — Telephone Encounter (Signed)
She states she would like to try the Xarelto, and see. I advised I would let MD know and we would get it ordered.  Patient also states she has been taking the Metorpolol Succinate 50 mg daily, she will take her BP at home and see what they are at home, she will call me in the next few days to let me know those readings- she has not been taking them at home, but she will start this. She was advised to call back if BP continues to be elevated.

## 2021-05-10 NOTE — Telephone Encounter (Signed)
Called patient, she states that she is not able to afford the Eliquis. I did advise with her patient assistance information- but she states that she wants Dr.O'Neal to look and see if there is another option for her. I advised I would route to MD to advise. I offered samples and patient assistance.   She also wanted to make him aware of her BP readings from her visits with infusion center to get his recommendations. 152/102 today at [facility] for the infusion.   153/100 the other day at [facility] for the infusion.

## 2021-05-11 ENCOUNTER — Other Ambulatory Visit: Payer: Self-pay

## 2021-05-11 ENCOUNTER — Telehealth: Payer: Self-pay

## 2021-05-11 DIAGNOSIS — D509 Iron deficiency anemia, unspecified: Secondary | ICD-10-CM

## 2021-05-11 NOTE — Telephone Encounter (Signed)
-----   Message from Algernon Huxley, RN sent at 04/27/2021  2:25 PM EDT ----- Regarding: FW: Labs Orders in epic ----- Message ----- From: Irene Shipper, MD Sent: 04/27/2021  12:54 PM EDT To: Algernon Huxley, RN Subject: RE: Labs                                       Yes please ----- Message ----- From: Algernon Huxley, RN Sent: 04/27/2021  12:43 PM EDT To: Irene Shipper, MD Subject: Labs                                           Dr. Henrene Pastor,  Per the procedure report pt is to have CBC and Ferritin in 2 weeks. She had labs today at the cancer center for heme referral/IV iron. Do you still want repeat labs in 2 weeks?  Thanks, Vaughan Basta

## 2021-05-11 NOTE — Telephone Encounter (Signed)
Message left for pt to come for labs, orders in epic.

## 2021-05-17 ENCOUNTER — Other Ambulatory Visit: Payer: Self-pay

## 2021-05-17 ENCOUNTER — Inpatient Hospital Stay: Payer: Medicare Other

## 2021-05-17 ENCOUNTER — Other Ambulatory Visit (INDEPENDENT_AMBULATORY_CARE_PROVIDER_SITE_OTHER): Payer: Medicare Other

## 2021-05-17 VITALS — BP 140/103 | HR 102 | Temp 97.8°F | Resp 18

## 2021-05-17 DIAGNOSIS — I10 Essential (primary) hypertension: Secondary | ICD-10-CM | POA: Diagnosis not present

## 2021-05-17 DIAGNOSIS — Z7901 Long term (current) use of anticoagulants: Secondary | ICD-10-CM | POA: Diagnosis not present

## 2021-05-17 DIAGNOSIS — E119 Type 2 diabetes mellitus without complications: Secondary | ICD-10-CM | POA: Diagnosis not present

## 2021-05-17 DIAGNOSIS — D509 Iron deficiency anemia, unspecified: Secondary | ICD-10-CM

## 2021-05-17 DIAGNOSIS — I4891 Unspecified atrial fibrillation: Secondary | ICD-10-CM | POA: Diagnosis not present

## 2021-05-17 DIAGNOSIS — D500 Iron deficiency anemia secondary to blood loss (chronic): Secondary | ICD-10-CM

## 2021-05-17 DIAGNOSIS — E785 Hyperlipidemia, unspecified: Secondary | ICD-10-CM | POA: Diagnosis not present

## 2021-05-17 LAB — CBC WITH DIFFERENTIAL/PLATELET
Basophils Absolute: 0.1 10*3/uL (ref 0.0–0.1)
Basophils Relative: 1 % (ref 0.0–3.0)
Eosinophils Absolute: 0.2 10*3/uL (ref 0.0–0.7)
Eosinophils Relative: 2.5 % (ref 0.0–5.0)
HCT: 34.7 % — ABNORMAL LOW (ref 36.0–46.0)
Hemoglobin: 10.5 g/dL — ABNORMAL LOW (ref 12.0–15.0)
Lymphocytes Relative: 26.1 % (ref 12.0–46.0)
Lymphs Abs: 2.2 10*3/uL (ref 0.7–4.0)
MCHC: 30.3 g/dL (ref 30.0–36.0)
MCV: 78.6 fl (ref 78.0–100.0)
Monocytes Absolute: 0.6 10*3/uL (ref 0.1–1.0)
Monocytes Relative: 7.3 % (ref 3.0–12.0)
Neutro Abs: 5.3 10*3/uL (ref 1.4–7.7)
Neutrophils Relative %: 63.1 % (ref 43.0–77.0)
Platelets: 341 10*3/uL (ref 150.0–400.0)
RBC: 4.41 Mil/uL (ref 3.87–5.11)
RDW: 24.9 % — ABNORMAL HIGH (ref 11.5–15.5)
WBC: 8.5 10*3/uL (ref 4.0–10.5)

## 2021-05-17 LAB — FERRITIN: Ferritin: 67.1 ng/mL (ref 10.0–291.0)

## 2021-05-17 MED ORDER — SODIUM CHLORIDE 0.9 % IV SOLN
Freq: Once | INTRAVENOUS | Status: AC
  Filled 2021-05-17: qty 250

## 2021-05-17 MED ORDER — CYANOCOBALAMIN 1000 MCG/ML IJ SOLN
INTRAMUSCULAR | Status: AC
  Filled 2021-05-17: qty 1

## 2021-05-17 MED ORDER — CYANOCOBALAMIN 1000 MCG/ML IJ SOLN
1000.0000 ug | Freq: Once | INTRAMUSCULAR | Status: AC
  Administered 2021-05-17: 1000 ug via INTRAMUSCULAR

## 2021-05-17 MED ORDER — SODIUM CHLORIDE 0.9 % IV SOLN
300.0000 mg | Freq: Once | INTRAVENOUS | Status: AC
  Administered 2021-05-17: 300 mg via INTRAVENOUS
  Filled 2021-05-17: qty 300

## 2021-05-17 NOTE — Progress Notes (Signed)
Patient was observed for 30 minutes post iron infusion with no difficulties. Vitals stable and patient with no complaints upon leaving infusion clinic.

## 2021-05-17 NOTE — Patient Instructions (Signed)

## 2021-05-21 DIAGNOSIS — H903 Sensorineural hearing loss, bilateral: Secondary | ICD-10-CM | POA: Diagnosis not present

## 2021-05-25 DIAGNOSIS — H903 Sensorineural hearing loss, bilateral: Secondary | ICD-10-CM | POA: Diagnosis not present

## 2021-05-25 DIAGNOSIS — H9313 Tinnitus, bilateral: Secondary | ICD-10-CM | POA: Diagnosis not present

## 2021-06-03 ENCOUNTER — Ambulatory Visit
Admission: RE | Admit: 2021-06-03 | Discharge: 2021-06-03 | Disposition: A | Discharge status: Home / Self Care | Payer: Medicare Other | Source: Ambulatory Visit | Attending: Family Medicine | Admitting: Family Medicine

## 2021-06-03 ENCOUNTER — Other Ambulatory Visit: Payer: Self-pay

## 2021-06-03 ENCOUNTER — Other Ambulatory Visit: Payer: Self-pay | Admitting: Family Medicine

## 2021-06-03 DIAGNOSIS — M79652 Pain in left thigh: Secondary | ICD-10-CM

## 2021-06-03 DIAGNOSIS — I10 Essential (primary) hypertension: Secondary | ICD-10-CM | POA: Diagnosis not present

## 2021-06-03 DIAGNOSIS — M1612 Unilateral primary osteoarthritis, left hip: Secondary | ICD-10-CM | POA: Diagnosis not present

## 2021-06-03 DIAGNOSIS — M533 Sacrococcygeal disorders, not elsewhere classified: Secondary | ICD-10-CM | POA: Diagnosis not present

## 2021-06-03 DIAGNOSIS — I4891 Unspecified atrial fibrillation: Secondary | ICD-10-CM | POA: Diagnosis not present

## 2021-06-03 DIAGNOSIS — D509 Iron deficiency anemia, unspecified: Secondary | ICD-10-CM | POA: Diagnosis not present

## 2021-06-08 ENCOUNTER — Encounter: Payer: Self-pay | Admitting: Internal Medicine

## 2021-06-08 ENCOUNTER — Inpatient Hospital Stay: Payer: Medicare Other

## 2021-06-08 ENCOUNTER — Inpatient Hospital Stay: Payer: Medicare Other | Attending: Internal Medicine

## 2021-06-08 ENCOUNTER — Other Ambulatory Visit: Payer: Self-pay

## 2021-06-08 ENCOUNTER — Inpatient Hospital Stay (HOSPITAL_BASED_OUTPATIENT_CLINIC_OR_DEPARTMENT_OTHER): Payer: Medicare Other | Admitting: Internal Medicine

## 2021-06-08 VITALS — BP 139/93 | HR 106 | Temp 97.8°F | Resp 20 | Ht 63.0 in | Wt 155.5 lb

## 2021-06-08 DIAGNOSIS — I10 Essential (primary) hypertension: Secondary | ICD-10-CM | POA: Diagnosis not present

## 2021-06-08 DIAGNOSIS — I4891 Unspecified atrial fibrillation: Secondary | ICD-10-CM | POA: Insufficient documentation

## 2021-06-08 DIAGNOSIS — E1136 Type 2 diabetes mellitus with diabetic cataract: Secondary | ICD-10-CM | POA: Insufficient documentation

## 2021-06-08 DIAGNOSIS — K219 Gastro-esophageal reflux disease without esophagitis: Secondary | ICD-10-CM | POA: Insufficient documentation

## 2021-06-08 DIAGNOSIS — Z7901 Long term (current) use of anticoagulants: Secondary | ICD-10-CM | POA: Diagnosis not present

## 2021-06-08 DIAGNOSIS — D539 Nutritional anemia, unspecified: Secondary | ICD-10-CM | POA: Diagnosis not present

## 2021-06-08 DIAGNOSIS — Z794 Long term (current) use of insulin: Secondary | ICD-10-CM | POA: Diagnosis not present

## 2021-06-08 DIAGNOSIS — D500 Iron deficiency anemia secondary to blood loss (chronic): Secondary | ICD-10-CM

## 2021-06-08 DIAGNOSIS — E538 Deficiency of other specified B group vitamins: Secondary | ICD-10-CM | POA: Insufficient documentation

## 2021-06-08 DIAGNOSIS — D509 Iron deficiency anemia, unspecified: Secondary | ICD-10-CM | POA: Diagnosis not present

## 2021-06-08 DIAGNOSIS — Z79899 Other long term (current) drug therapy: Secondary | ICD-10-CM | POA: Diagnosis not present

## 2021-06-08 DIAGNOSIS — D518 Other vitamin B12 deficiency anemias: Secondary | ICD-10-CM

## 2021-06-08 DIAGNOSIS — E7800 Pure hypercholesterolemia, unspecified: Secondary | ICD-10-CM | POA: Diagnosis not present

## 2021-06-08 LAB — IRON AND TIBC
Iron: 75 ug/dL (ref 41–142)
Saturation Ratios: 26 % (ref 21–57)
TIBC: 285 ug/dL (ref 236–444)
UIBC: 211 ug/dL (ref 120–384)

## 2021-06-08 LAB — CBC WITH DIFFERENTIAL (CANCER CENTER ONLY)
Abs Immature Granulocytes: 0.02 10*3/uL (ref 0.00–0.07)
Basophils Absolute: 0.1 10*3/uL (ref 0.0–0.1)
Basophils Relative: 1 %
Eosinophils Absolute: 0.3 10*3/uL (ref 0.0–0.5)
Eosinophils Relative: 3 %
HCT: 41.5 % (ref 36.0–46.0)
Hemoglobin: 12.8 g/dL (ref 12.0–15.0)
Immature Granulocytes: 0 %
Lymphocytes Relative: 27 %
Lymphs Abs: 2.1 10*3/uL (ref 0.7–4.0)
MCH: 25.7 pg — ABNORMAL LOW (ref 26.0–34.0)
MCHC: 30.8 g/dL (ref 30.0–36.0)
MCV: 83.2 fL (ref 80.0–100.0)
Monocytes Absolute: 0.5 10*3/uL (ref 0.1–1.0)
Monocytes Relative: 7 %
Neutro Abs: 4.6 10*3/uL (ref 1.7–7.7)
Neutrophils Relative %: 62 %
Platelet Count: 279 10*3/uL (ref 150–400)
RBC: 4.99 MIL/uL (ref 3.87–5.11)
RDW: 25.2 % — ABNORMAL HIGH (ref 11.5–15.5)
WBC Count: 7.5 10*3/uL (ref 4.0–10.5)
nRBC: 0 % (ref 0.0–0.2)

## 2021-06-08 LAB — VITAMIN B12: Vitamin B-12: 275 pg/mL (ref 180–914)

## 2021-06-08 LAB — FERRITIN: Ferritin: 81 ng/mL (ref 11–307)

## 2021-06-08 MED ORDER — CYANOCOBALAMIN 1000 MCG/ML IJ SOLN
1000.0000 ug | Freq: Once | INTRAMUSCULAR | Status: AC
  Administered 2021-06-08: 1000 ug via INTRAMUSCULAR
  Filled 2021-06-08: qty 1

## 2021-06-08 NOTE — Progress Notes (Signed)
[place] Telephone:([phone]   Fax:(336) [phone]  OFFICE PROGRESS NOTE  Glenis Smoker, MD [place] [address]  DIAGNOSIS:  Persistent microcytic anemia secondary to iron deficiency of unclear etiology but could be secondary to malabsorption or occult gastrointestinal bleeding.  She underwent upper endoscopy and colonoscopy that were unremarkable.  PRIOR THERAPY:  1) Venofer 300 mg IV weekly for 3 weeks.   CURRENT THERAPY: Vitamin B12 injection 1000 mcg intramuscular weekly for 4 weeks and then monthly.  INTERVAL HISTORY: Danielle [name] 86 y.o. female returns to the clinic today for follow-up visit.  The patient is feeling much better today.  She denied having any significant fatigue or weakness.  She has no chest pain, shortness of breath, cough or hemoptysis.  She denied having any nausea, vomiting, diarrhea or constipation.  She has no headache or visual changes.  She tolerated her iron infusion with Venofer fairly well.  She also received vitamin B12 injection weekly for 4 weeks.  She is here today for evaluation and repeat blood work.  MEDICAL HISTORY: Past Medical History:  Diagnosis Date   Anemia    Anxiety    Arthritis    Atrial fibrillation ([place])    Cataract    bilateral   Colon polyps    Diabetes ([place])    Diabetes mellitus without complication (HCC)    type two   GERD (gastroesophageal reflux disease)    Hypercholesteremia    Hypertension    Iron deficiency anemia    Pancreatitis    Peripheral neuropathy    Squamous cell skin cancer     ALLERGIES:  is allergic to amoxicillin, hydrochlorothiazide, cleocin [clindamycin hcl], codeine, and penicillins.  MEDICATIONS:  Current Outpatient Medications  Medication Sig Dispense Refill   atorvastatin (LIPITOR) 20 MG tablet Take 10 mg by mouth daily.     calcium carbonate (OSCAL) 1500 (600 Ca) MG TABS tablet Take 600 mg of elemental calcium by mouth 2 (two)  times daily with a meal.     calcium carbonate (TUMS - DOSED IN MG ELEMENTAL CALCIUM) 500 MG chewable tablet Chew 2 tablets by mouth as needed for indigestion or heartburn.     Cholecalciferol (VITAMIN D3) 1000 UNITS CAPS Take 1,000 Units by mouth in the morning and at bedtime.     FERROUS SULFATE ER PO Take by mouth. (Patient not taking: Reported on 04/30/2021)     gabapentin (NEURONTIN) 300 MG capsule Take 900 mg by mouth at bedtime.     Glucosamine-Chondroit-Vit C-Mn (GLUCOSAMINE CHONDR 1500 COMPLX PO) Take 2 capsules by mouth in the morning and at bedtime.     insulin glargine (LANTUS) 100 UNIT/ML injection Inject 22-24 Units into the skin at bedtime.     metFORMIN (GLUMETZA) 1000 MG (MOD) 24 hr tablet Take 1,000 mg by mouth 2 (two) times daily with a meal.     metoprolol succinate (TOPROL-XL) 50 MG 24 hr tablet Take 1 tablet (50 mg total) by mouth daily. Take with or immediately following a meal. 90 tablet 3   omeprazole (PRILOSEC) 40 MG capsule Take 1 capsule by mouth daily.     rivaroxaban (XARELTO) 20 MG TABS tablet Take 1 tablet (20 mg total) by mouth daily with supper. 30 tablet 3   No current facility-administered medications for this visit.    SURGICAL HISTORY:  Past Surgical History:  Procedure Laterality Date   APPENDECTOMY  1957   BREAST EXCISIONAL BIOPSY Right 1966  CATARACT EXTRACTION, BILATERAL     CHOLECYSTECTOMY  10/2008   lump removed right breast  1958   bENIGN   right ankle plate  [id number]   VAGINAL HYSTERECTOMY  1978    REVIEW OF SYSTEMS:  A comprehensive review of systems was negative.   PHYSICAL EXAMINATION: General appearance: alert, cooperative, and no distress Head: Normocephalic, without obvious abnormality, atraumatic Neck: no adenopathy, no JVD, supple, symmetrical, trachea midline, and thyroid not enlarged, symmetric, no tenderness/mass/nodules Lymph nodes: Cervical, supraclavicular, and axillary nodes normal. Resp: clear to auscultation  bilaterally Back: symmetric, no curvature. ROM normal. No CVA tenderness. Cardio: regular rate and rhythm, S1, S2 normal, no murmur, click, rub or gallop GI: soft, non-tender; bowel sounds normal; no masses,  no organomegaly Extremities: extremities normal, atraumatic, no cyanosis or edema  ECOG PERFORMANCE STATUS: 1 - Symptomatic but completely ambulatory  Blood pressure (!) 139/93, pulse (!) 106, temperature 97.8 F (36.6 C), temperature source Tympanic, resp. rate 20, height '5\' 3"'$  (1.6 m), weight 155 lb 8 oz (70.5 kg), SpO2 100 %.  LABORATORY DATA: Lab Results  Component Value Date   WBC 7.5 06/08/2021   HGB 12.8 06/08/2021   HCT 41.5 06/08/2021   MCV 83.2 06/08/2021   PLT 279 06/08/2021      Chemistry      Component Value Date/Time   NA 140 04/27/2021 1112   NA 139 02/26/2021 1100   K 4.4 04/27/2021 1112   CL 106 04/27/2021 1112   CO2 24 04/27/2021 1112   BUN 12 04/27/2021 1112   BUN 12 02/26/2021 1100   CREATININE 0.91 04/27/2021 1112      Component Value Date/Time   CALCIUM 8.9 04/27/2021 1112   ALKPHOS 66 04/27/2021 1112   AST 28 04/27/2021 1112   ALT 27 04/27/2021 1112   BILITOT 0.7 04/27/2021 1112       RADIOGRAPHIC STUDIES: DG HIP UNILAT WITH PELVIS 2-3 VIEWS LEFT  Result Date: 06/05/2021 CLINICAL DATA:  Left thigh pain and lateral left hip pain to groin 2 weeks. No injury. EXAM: DG HIP (WITH OR WITHOUT PELVIS) 2-3V LEFT COMPARISON:  None. FINDINGS: Exam demonstrates mild osteoarthritic change of the left hip. No evidence of acute fracture or dislocation. No focal lytic or sclerotic lesions. There is mild degenerate change of the spine and sacroiliac joints. IMPRESSION: 1. No acute findings. 2. Mild osteoarthritis of the left hip. Electronically Signed   By: Marin Olp M.D.   On: 06/05/2021 09:29    ASSESSMENT AND PLAN: This is a very pleasant 86 years old white female with history of microcytic anemia secondary to iron deficiency of unclear etiology but  likely from gastrointestinal blood loss.  The patient was treated with Venofer 300 mg IV weekly for 3 weeks and tolerated her treatment well.  She was also found to have vitamin B12 deficiency and she received vitamin B12 injection weekly for 4 weeks. Repeat CBC today showed significant improvement in her hemoglobin and hematocrit.  Iron study and ferritin are still pending. I recommended for the patient to continue on the vitamin B12 injection monthly. I will see her back for follow-up visit in 3 months for evaluation and repeat CBC, iron study, ferritin and vitamin B12 level. I would consider The patient for iron infusion if she continues to have persistent iron deficiency. She was advised to call immediately if she has any other concerning symptoms in the interval. The patient voices understanding of current disease status and treatment options and is in agreement  with the current care plan.  All questions were answered. The patient knows to call the clinic with any problems, questions or concerns. We can certainly see the patient much sooner if necessary. The total time spent in the appointment was 20 minutes.  Disclaimer: This note was dictated with voice recognition software. Similar sounding words can inadvertently be transcribed and may not be corrected upon review.

## 2021-06-16 DIAGNOSIS — L570 Actinic keratosis: Secondary | ICD-10-CM | POA: Diagnosis not present

## 2021-06-16 DIAGNOSIS — Z85828 Personal history of other malignant neoplasm of skin: Secondary | ICD-10-CM | POA: Diagnosis not present

## 2021-06-16 DIAGNOSIS — L814 Other melanin hyperpigmentation: Secondary | ICD-10-CM | POA: Diagnosis not present

## 2021-06-16 DIAGNOSIS — L821 Other seborrheic keratosis: Secondary | ICD-10-CM | POA: Diagnosis not present

## 2021-06-30 DIAGNOSIS — B351 Tinea unguium: Secondary | ICD-10-CM | POA: Diagnosis not present

## 2021-06-30 DIAGNOSIS — L851 Acquired keratosis [keratoderma] palmaris et plantaris: Secondary | ICD-10-CM | POA: Diagnosis not present

## 2021-06-30 DIAGNOSIS — Z794 Long term (current) use of insulin: Secondary | ICD-10-CM | POA: Diagnosis not present

## 2021-06-30 DIAGNOSIS — E1140 Type 2 diabetes mellitus with diabetic neuropathy, unspecified: Secondary | ICD-10-CM | POA: Diagnosis not present

## 2021-07-06 ENCOUNTER — Inpatient Hospital Stay: Payer: Medicare Other | Attending: Internal Medicine

## 2021-07-06 ENCOUNTER — Other Ambulatory Visit: Payer: Self-pay

## 2021-07-06 DIAGNOSIS — E538 Deficiency of other specified B group vitamins: Secondary | ICD-10-CM | POA: Insufficient documentation

## 2021-07-06 DIAGNOSIS — D500 Iron deficiency anemia secondary to blood loss (chronic): Secondary | ICD-10-CM

## 2021-07-06 MED ORDER — CYANOCOBALAMIN 1000 MCG/ML IJ SOLN
1000.0000 ug | Freq: Once | INTRAMUSCULAR | Status: AC
  Administered 2021-07-06: 1000 ug via INTRAMUSCULAR
  Filled 2021-07-06: qty 1

## 2021-08-01 NOTE — Progress Notes (Signed)
Cardiology Office Note:   Date:  08/02/2021  NAME:  Danielle [name]    MRN: [id number] DOB:  February 01, 1935   PCP:  Glenis Smoker, MD  Cardiologist:  None  Electrophysiologist:  None   Referring MD: Glenis Smoker, *   Chief Complaint  Patient presents with   Atrial Fibrillation    History of Present Illness:   Danielle [name] is a 86 y.o. female with a hx of DM, HTN, persistent Afib who presents for follow-up. Diagnosed with IDA. Normal colonoscopy/EGD. On iron infusions. No bleeding.  She reports she feels much better with improvement in her hemoglobin.  Hemoglobin value 12.8.  She reports she felt very poorly when it was down in the 8 range.  She informs me that she is no longer short of breath and tired.  She remains in atrial fibrillation on EKG.  Heart rate 105.  She reports she does not feel her heart racing.  She denies any lower extremity edema or symptoms of heart failure.  Heart rate is still little too fast.  We did discuss options which would include cardioversion versus rate control.  Given her age and lack of symptoms I think it is best to continue with rate control strategy.  She does have severe left atrial dilation I do not believe she will remain in atrial fibrillation without rhythm control medications.  Given her lack of symptoms and is really hard to make her feel better.  She is in agreement with this.  Problem List 1. DM -A1c 7.8 2. HLD -T chol 88, HDL 33, LDL 25, TG 185 3. HTN 4. Persistent Afib -Dx 02/26/2021 -CHADSVASC=5 (age, female, DM, HTN) 5. Iron deficiency anemia -normal colonoscopy/EGD -HGB 12.8 6. RBBB/LAFB  Past Medical History: Past Medical History:  Diagnosis Date   Anemia    Anxiety    Arrhythmia    Arthritis    Atrial fibrillation ([place])    Cataract    bilateral   Colon polyps    Diabetes ([place])    Diabetes mellitus without complication ([place])    type two   GERD (gastroesophageal reflux disease)    Hypercholesteremia     Hypertension    Iron deficiency anemia    Pancreatitis    Peripheral neuropathy    Squamous cell skin cancer     Past Surgical History: Past Surgical History:  Procedure Laterality Date   APPENDECTOMY  1957   BREAST EXCISIONAL BIOPSY Right 1966   CATARACT EXTRACTION, BILATERAL     CHOLECYSTECTOMY  10/2008   lump removed right breast  1958   bENIGN   right ankle plate  [id number]   VAGINAL HYSTERECTOMY  1978    Current Medications: Current Meds  Medication Sig   atorvastatin (LIPITOR) 20 MG tablet Take 10 mg by mouth daily.   calcium carbonate (OSCAL) 1500 (600 Ca) MG TABS tablet Take 600 mg of elemental calcium by mouth 2 (two) times daily with a meal.   calcium carbonate (TUMS - DOSED IN MG ELEMENTAL CALCIUM) 500 MG chewable tablet Chew 2 tablets by mouth as needed for indigestion or heartburn.   Cholecalciferol (VITAMIN D3) 1000 UNITS CAPS Take 1,000 Units by mouth in the morning and at bedtime.   cyanocobalamin (,VITAMIN B-12,) 1000 MCG/ML injection Inject into the muscle.   diltiazem (CARDIZEM CD) 120 MG 24 hr capsule Take 1 capsule (120 mg total) by mouth daily.   FERROUS SULFATE ER PO Take by mouth.   gabapentin (NEURONTIN) 300 MG capsule  Take 900 mg by mouth at bedtime.   Glucosamine-Chondroit-Vit C-Mn (GLUCOSAMINE CHONDR 1500 COMPLX PO) Take 2 capsules by mouth in the morning and at bedtime.   insulin glargine (LANTUS) 100 UNIT/ML injection Inject 22-24 Units into the skin at bedtime.   metFORMIN (GLUCOPHAGE) 1000 MG tablet Take 1,000 mg by mouth 2 (two) times daily.   metFORMIN (GLUMETZA) 1000 MG (MOD) 24 hr tablet Take 1,000 mg by mouth 2 (two) times daily with a meal.   metoprolol succinate (TOPROL-XL) 50 MG 24 hr tablet Take 1 tablet (50 mg total) by mouth daily. Take with or immediately following a meal. (Patient taking differently: Take 50 mg by mouth in the morning and at bedtime. Take with or immediately following a meal.)   omeprazole (PRILOSEC) 40 MG capsule Take 1  capsule by mouth daily.   rivaroxaban (XARELTO) 20 MG TABS tablet Take 1 tablet (20 mg total) by mouth daily with supper.   traMADol (ULTRAM) 50 MG tablet Take 50 mg by mouth 3 (three) times daily as needed.   Turmeric (QC TUMERIC COMPLEX) 500 MG CAPS Take by mouth.   [DISCONTINUED] amLODipine (NORVASC) 2.5 MG tablet Take 2.5 mg by mouth daily.     Allergies:    Amoxicillin, Hydrochlorothiazide, Cleocin [clindamycin hcl], Codeine, and Penicillins   Social History: Social History   Socioeconomic History   Marital status: Widowed    Spouse name: Not on file   Number of children: 3   Years of education: Not on file   Highest education level: Not on file  Occupational History   Not on file  Tobacco Use   Smoking status: Never   Smokeless tobacco: Never  Substance and Sexual Activity   Alcohol use: No    Alcohol/week: 0.0 standard drinks   Drug use: No   Sexual activity: Not on file  Other Topics Concern   Not on file  Social History Narrative   Not on file   Social Determinants of Health   Financial Resource Strain: Not on file  Food Insecurity: Not on file  Transportation Needs: Not on file  Physical Activity: Not on file  Stress: Not on file  Social Connections: Not on file     Family History: The patient's family history includes Atrial fibrillation in her brother; CAD in her sister; Cerebral aneurysm in her father; Colon cancer in her sister and sister; Colon cancer (age of onset: 8) in her mother; Dementia in her sister; Diabetes in her mother; Heart attack in her brother, brother, and sister; Hypertension in her mother; Parkinsonism in her sister; Stroke in her mother; Throat cancer in her brother; Uterine cancer in her sister.  ROS:   All other ROS reviewed and negative. Pertinent positives noted in the HPI.     EKGs/Labs/Other Studies Reviewed:   The following studies were personally reviewed by me today:  EKG:  EKG is ordered today.  The ekg ordered today  demonstrates atrial fibrillation heart rate 105, recommend branch block, left anterior fascicular block, and was personally reviewed by me.   TTE 03/23/2021   1. Left ventricular ejection fraction, by estimation, is 55 to 60%. The  left ventricle has normal function. The left ventricle has no regional  wall motion abnormalities. There is mild left ventricular hypertrophy.  Left ventricular diastolic function  could not be evaluated.   2. Right ventricular systolic function is normal. The right ventricular  size is normal. There is moderately elevated pulmonary artery systolic  pressure. The estimated right  ventricular systolic pressure is 76.7 mmHg.   3. Left atrial size was severely dilated.   4. The mitral valve is abnormal. Trivial mitral valve regurgitation.   5. Tricuspid valve regurgitation is mild to moderate.   6. The aortic valve is tricuspid. Aortic valve regurgitation is not  visualized. Mild to moderate aortic valve sclerosis/calcification is  present, without any evidence of aortic stenosis.   7. The inferior vena cava is normal in size with <50% respiratory  variability, suggesting right atrial pressure of 8 mmHg.   Recent Labs: 02/26/2021: TSH 2.070 04/27/2021: ALT 27; BUN 12; Creatinine 0.91; Potassium 4.4; Sodium 140 06/08/2021: Hemoglobin 12.8; Platelet Count 279   Recent Lipid Panel    Component Value Date/Time   CHOL  11/10/2008 0317    159        ATP III CLASSIFICATION:  <200     mg/dL   Desirable  200-239  mg/dL   Borderline High  >=240    mg/dL   High          TRIG 224 (H) 11/10/2008 0317   HDL 42 11/10/2008 0317   CHOLHDL 3.8 11/10/2008 0317   VLDL 45 (H) 11/10/2008 0317   LDLCALC  11/10/2008 0317    72        Total Cholesterol/HDL:CHD Risk Coronary Heart Disease Risk Table                     Men   Women  1/2 Average Risk   3.4   3.3  Average Risk       5.0   4.4  2 X Average Risk   9.6   7.1  3 X Average Risk  23.4   11.0        Use the calculated  Patient Ratio above and the CHD Risk Table to determine the patient's CHD Risk.        ATP III CLASSIFICATION (LDL):  <100     mg/dL   Optimal  100-129  mg/dL   Near or Above                    Optimal  130-159  mg/dL   Borderline  160-189  mg/dL   High  >190     mg/dL   Very High    Physical Exam:   VS:  BP (!) 144/92 (BP Location: Right Arm, Patient Position: Sitting, Cuff Size: Normal)   Pulse (!) 105   Ht 5\' 3"  (1.6 m)   Wt 164 lb 3.2 oz (74.5 kg)   SpO2 98%   BMI 29.09 kg/m    Wt Readings from Last 3 Encounters:  08/02/21 164 lb 3.2 oz (74.5 kg)  06/08/21 155 lb 8 oz (70.5 kg)  04/30/21 165 lb (74.8 kg)    General: Well nourished, well developed, in no acute distress Head: Atraumatic, normal size  Eyes: PEERLA, EOMI  Neck: Supple, no JVD Endocrine: No thryomegaly Cardiac: Normal S1, S2; irregular rhythm, no murmurs rubs or gallops Lungs: Clear to auscultation bilaterally, no wheezing, rhonchi or rales  Abd: Soft, nontender, no hepatomegaly  Ext: No edema, pulses 2+ Musculoskeletal: No deformities, BUE and BLE strength normal and equal Skin: Warm and dry, no rashes   Neuro: Alert and oriented to person, place, time, and situation, CNII-XII grossly intact, no focal deficits  Psych: Normal mood and affect   ASSESSMENT:   Danielle [name] is a 86 y.o. female who presents for the following:  1. Persistent atrial fibrillation ([place])   2. SOB (shortness of breath) on exertion   3. Primary hypertension     PLAN:   1. Persistent atrial fibrillation ([place]) -Remains in atrial fibrillation.  Symptoms of shortness of breath improved drastically with an improvement in her anemia.  She reports she no longer feels short of breath or tired.  Her heart rate is also much improved in the low 100s today.  She was switched to Xarelto as Eliquis was cost prohibitive.  She is back on this and tolerating this without issues.  Her GI work-up was negative for any bleeding.  This appears to be  just iron deficiency anemia.  She reports no chest pains or trouble breathing.  Again symptoms improved drastically with improvement in her hemoglobin levels.  We did discuss options for management.  We discussed rate versus rhythm control.  Her echocardiogram does show severe left atrial dilation.  I do believe rhythm control could be problematic for her given severe left atrial dilation.  Given her age she is not a great candidate for ablation.  I think the best option given her lack of symptoms would be continued rate control strategy.  She is in agreement with this.  We will stop her amlodipine and add diltiazem extended release 120 mg daily.  I would like for her to also continue metoprolol succinate 50 mg twice daily.  She can track her heart rate on her Fitbit.  Again she really has no symptoms from her atrial fibrillation and symptoms have improved drastically with hemoglobin improvement.  I suspect anemia was a bigger driver here.  2. SOB (shortness of breath) on exertion -Improved with transfusion.  Likely related to anemia.   3. Primary hypertension -Stop amlodipine.  Start diltiazem extended release 120 mg daily.  Continue metoprolol succinate 50 mg twice daily.  Given her age I believe BP value less than 140/90 would be an acceptable goal.  Disposition: Return in about 4 months (around 12/03/2021).  Medication Adjustments/Labs and Tests Ordered: Current medicines are reviewed at length with the patient today.  Concerns regarding medicines are outlined above.  Orders Placed This Encounter  Procedures   EKG 12-Lead    Meds ordered this encounter  Medications   diltiazem (CARDIZEM CD) 120 MG 24 hr capsule    Sig: Take 1 capsule (120 mg total) by mouth daily.    Dispense:  90 capsule    Refill:  3     Patient Instructions  Medication Instructions:  STOP Amlodipine  Start Cardizem XR 120 mg daily   *If you need a refill on your cardiac medications before your next appointment,  please call your pharmacy*   Follow-Up: At [facility], you and your health needs are our priority.  As part of our continuing mission to provide you with exceptional heart care, we have created designated Provider Care Teams.  These Care Teams include your primary Cardiologist (physician) and Advanced Practice Providers (APPs -  Physician Assistants and Nurse Practitioners) who all work together to provide you with the care you need, when you need it.  We recommend signing up for the patient portal called "MyChart".  Sign up information is provided on this After Visit Summary.  MyChart is used to connect with patients for Virtual Visits (Telemedicine).  Patients are able to view lab/test results, encounter notes, upcoming appointments, etc.  Non-urgent messages can be sent to your provider as well.   To learn more about what you can do with  MyChart, go to [email or web address].    Your next appointment:   4 month(s)  The format for your next appointment:   In Person  Provider:   Eleonore Chiquito, MD     Time Spent with Patient: I have spent a total of 35 minutes with patient reviewing hospital notes, telemetry, EKGs, labs and examining the patient as well as establishing an assessment and plan that was discussed with the patient.  > 50% of time was spent in direct patient care.  Signed, Addison Naegeli. Audie Box, MD, [facility]  [address], [place], [place] [address] [phone]  08/02/2021 4:53 PM

## 2021-08-02 ENCOUNTER — Encounter: Payer: Self-pay | Admitting: Cardiovascular Disease

## 2021-08-02 ENCOUNTER — Other Ambulatory Visit: Payer: Self-pay

## 2021-08-02 ENCOUNTER — Ambulatory Visit (INDEPENDENT_AMBULATORY_CARE_PROVIDER_SITE_OTHER): Payer: Medicare Other | Admitting: Cardiovascular Disease

## 2021-08-02 VITALS — BP 144/92 | HR 105 | Ht 63.0 in | Wt 164.2 lb

## 2021-08-02 DIAGNOSIS — I10 Essential (primary) hypertension: Secondary | ICD-10-CM

## 2021-08-02 DIAGNOSIS — I4819 Other persistent atrial fibrillation: Secondary | ICD-10-CM | POA: Diagnosis not present

## 2021-08-02 DIAGNOSIS — R0602 Shortness of breath: Secondary | ICD-10-CM

## 2021-08-02 MED ORDER — DILTIAZEM HCL ER COATED BEADS 120 MG PO CP24: 120.0000 mg | ORAL_CAPSULE | Freq: Every day | ORAL | 3 refills | Status: DC

## 2021-08-02 NOTE — Patient Instructions (Signed)
Medication Instructions:  STOP Amlodipine  Start Cardizem XR 120 mg daily   *If you need a refill on your cardiac medications before your next appointment, please call your pharmacy*   Follow-Up: At [facility], you and your health needs are our priority.  As part of our continuing mission to provide you with exceptional heart care, we have created designated Provider Care Teams.  These Care Teams include your primary Cardiologist (physician) and Advanced Practice Providers (APPs -  Physician Assistants and Nurse Practitioners) who all work together to provide you with the care you need, when you need it.  We recommend signing up for the patient portal called "MyChart".  Sign up information is provided on this After Visit Summary.  MyChart is used to connect with patients for Virtual Visits (Telemedicine).  Patients are able to view lab/test results, encounter notes, upcoming appointments, etc.  Non-urgent messages can be sent to your provider as well.   To learn more about what you can do with MyChart, go to [email or web address].    Your next appointment:   4 month(s)  The format for your next appointment:   In Person  Provider:   Eleonore Chiquito, MD

## 2021-08-05 ENCOUNTER — Telehealth: Payer: Self-pay | Admitting: Cardiovascular Disease

## 2021-08-05 DIAGNOSIS — Z961 Presence of intraocular lens: Secondary | ICD-10-CM | POA: Diagnosis not present

## 2021-08-05 DIAGNOSIS — H52203 Unspecified astigmatism, bilateral: Secondary | ICD-10-CM | POA: Diagnosis not present

## 2021-08-05 DIAGNOSIS — E119 Type 2 diabetes mellitus without complications: Secondary | ICD-10-CM | POA: Diagnosis not present

## 2021-08-05 DIAGNOSIS — H26493 Other secondary cataract, bilateral: Secondary | ICD-10-CM | POA: Diagnosis not present

## 2021-08-05 MED ORDER — METOPROLOL SUCCINATE ER 50 MG PO TB24: 50.0000 mg | ORAL_TABLET | Freq: Two times a day (BID) | ORAL | 3 refills | Status: DC

## 2021-08-05 NOTE — Telephone Encounter (Signed)
Pt c/o medication issue:  1. Name of Medication:  metoprolol succinate (TOPROL-XL) 50 MG 24 hr tablet  2. How are you currently taking this medication (dosage and times per day)?  Take 50 mg by mouth in the morning and at bedtime. Take with or immediately following a meal.  3. Are you having a reaction (difficulty breathing--STAT)? No   4. What is your medication issue? Pt has been directed to take two pills a day but hasn't been given the extra quantity to keep her from running out of meds .. please send in new prescription to provide a 90 ds based on how she is currently taking her medicine

## 2021-08-10 ENCOUNTER — Inpatient Hospital Stay: Payer: Medicare Other | Attending: Internal Medicine

## 2021-08-10 ENCOUNTER — Other Ambulatory Visit: Payer: Self-pay

## 2021-08-10 DIAGNOSIS — E538 Deficiency of other specified B group vitamins: Secondary | ICD-10-CM | POA: Diagnosis not present

## 2021-08-10 DIAGNOSIS — D500 Iron deficiency anemia secondary to blood loss (chronic): Secondary | ICD-10-CM

## 2021-08-10 MED ORDER — CYANOCOBALAMIN 1000 MCG/ML IJ SOLN
1000.0000 ug | Freq: Once | INTRAMUSCULAR | Status: AC
  Administered 2021-08-10: 1000 ug via INTRAMUSCULAR
  Filled 2021-08-10: qty 1

## 2021-08-10 NOTE — Patient Instructions (Signed)

## 2021-08-19 ENCOUNTER — Telehealth: Payer: Self-pay | Admitting: Internal Medicine

## 2021-08-19 NOTE — Telephone Encounter (Signed)
Rescheduled upcoming appointment due to provider on call. Patient is aware of changes. 

## 2021-08-28 ENCOUNTER — Other Ambulatory Visit: Payer: Self-pay | Admitting: Cardiovascular Disease

## 2021-08-30 DIAGNOSIS — E1140 Type 2 diabetes mellitus with diabetic neuropathy, unspecified: Secondary | ICD-10-CM | POA: Diagnosis not present

## 2021-08-30 DIAGNOSIS — I10 Essential (primary) hypertension: Secondary | ICD-10-CM | POA: Diagnosis not present

## 2021-08-30 DIAGNOSIS — Z23 Encounter for immunization: Secondary | ICD-10-CM | POA: Diagnosis not present

## 2021-08-30 DIAGNOSIS — M25552 Pain in left hip: Secondary | ICD-10-CM | POA: Diagnosis not present

## 2021-08-30 DIAGNOSIS — D509 Iron deficiency anemia, unspecified: Secondary | ICD-10-CM | POA: Diagnosis not present

## 2021-08-30 NOTE — Telephone Encounter (Signed)
Prescription refill request for Xarelto received.  Indication:Afib Last office visit:6/22 Weight:74.5 kg Age:86 Scr:0.9 CrCl: 49.25 ml/min  May need to reduce Xarelto prescription

## 2021-09-01 NOTE — Telephone Encounter (Signed)
*  STAT* If patient is at the pharmacy, call can be transferred to refill team.   1. Which medications need to be refilled? (please list name of each medication and dose if known) rivaroxaban (XARELTO) 20 MG TABS tablet  2. Which pharmacy/location (including street and city if local pharmacy) is medication to be sent to?[place], [place]  3. Do they need a 30 day or 90 day supply? 90 day

## 2021-09-01 NOTE — Telephone Encounter (Signed)
Pt is on correct dose, CrCl is 54 (weight 164 lbs, SCr 0.91), same stats you used, not sure why your CrCl came back lower, ok to refill at 20mg  daily

## 2021-09-07 ENCOUNTER — Encounter: Payer: Self-pay | Admitting: Internal Medicine

## 2021-09-08 ENCOUNTER — Inpatient Hospital Stay: Payer: Medicare Other | Attending: Internal Medicine

## 2021-09-08 ENCOUNTER — Other Ambulatory Visit: Payer: Self-pay

## 2021-09-08 ENCOUNTER — Ambulatory Visit: Payer: Medicare Other

## 2021-09-08 ENCOUNTER — Inpatient Hospital Stay (HOSPITAL_BASED_OUTPATIENT_CLINIC_OR_DEPARTMENT_OTHER): Payer: Medicare Other | Admitting: Internal Medicine

## 2021-09-08 ENCOUNTER — Other Ambulatory Visit: Payer: Medicare Other

## 2021-09-08 ENCOUNTER — Ambulatory Visit: Payer: Medicare Other | Admitting: Internal Medicine

## 2021-09-08 ENCOUNTER — Inpatient Hospital Stay: Payer: Medicare Other

## 2021-09-08 DIAGNOSIS — Z7901 Long term (current) use of anticoagulants: Secondary | ICD-10-CM | POA: Insufficient documentation

## 2021-09-08 DIAGNOSIS — E1136 Type 2 diabetes mellitus with diabetic cataract: Secondary | ICD-10-CM | POA: Diagnosis not present

## 2021-09-08 DIAGNOSIS — Z794 Long term (current) use of insulin: Secondary | ICD-10-CM | POA: Insufficient documentation

## 2021-09-08 DIAGNOSIS — D509 Iron deficiency anemia, unspecified: Secondary | ICD-10-CM | POA: Insufficient documentation

## 2021-09-08 DIAGNOSIS — I4891 Unspecified atrial fibrillation: Secondary | ICD-10-CM | POA: Diagnosis not present

## 2021-09-08 DIAGNOSIS — D500 Iron deficiency anemia secondary to blood loss (chronic): Secondary | ICD-10-CM

## 2021-09-08 DIAGNOSIS — D531 Other megaloblastic anemias, not elsewhere classified: Secondary | ICD-10-CM

## 2021-09-08 DIAGNOSIS — K219 Gastro-esophageal reflux disease without esophagitis: Secondary | ICD-10-CM | POA: Insufficient documentation

## 2021-09-08 DIAGNOSIS — E7800 Pure hypercholesterolemia, unspecified: Secondary | ICD-10-CM | POA: Diagnosis not present

## 2021-09-08 DIAGNOSIS — I10 Essential (primary) hypertension: Secondary | ICD-10-CM | POA: Diagnosis not present

## 2021-09-08 DIAGNOSIS — Z7984 Long term (current) use of oral hypoglycemic drugs: Secondary | ICD-10-CM | POA: Diagnosis not present

## 2021-09-08 DIAGNOSIS — E538 Deficiency of other specified B group vitamins: Secondary | ICD-10-CM | POA: Insufficient documentation

## 2021-09-08 DIAGNOSIS — D539 Nutritional anemia, unspecified: Secondary | ICD-10-CM

## 2021-09-08 DIAGNOSIS — Z79899 Other long term (current) drug therapy: Secondary | ICD-10-CM | POA: Diagnosis not present

## 2021-09-08 LAB — CBC WITH DIFFERENTIAL (CANCER CENTER ONLY)
Abs Immature Granulocytes: 0.02 10*3/uL (ref 0.00–0.07)
Basophils Absolute: 0.1 10*3/uL (ref 0.0–0.1)
Basophils Relative: 1 %
Eosinophils Absolute: 0.3 10*3/uL (ref 0.0–0.5)
Eosinophils Relative: 4 %
HCT: 40.8 % (ref 36.0–46.0)
Hemoglobin: 13.3 g/dL (ref 12.0–15.0)
Immature Granulocytes: 0 %
Lymphocytes Relative: 29 %
Lymphs Abs: 2.3 10*3/uL (ref 0.7–4.0)
MCH: 30.7 pg (ref 26.0–34.0)
MCHC: 32.6 g/dL (ref 30.0–36.0)
MCV: 94.2 fL (ref 80.0–100.0)
Monocytes Absolute: 0.4 10*3/uL (ref 0.1–1.0)
Monocytes Relative: 5 %
Neutro Abs: 4.8 10*3/uL (ref 1.7–7.7)
Neutrophils Relative %: 61 %
Platelet Count: 251 10*3/uL (ref 150–400)
RBC: 4.33 MIL/uL (ref 3.87–5.11)
RDW: 14.9 % (ref 11.5–15.5)
WBC Count: 7.9 10*3/uL (ref 4.0–10.5)
nRBC: 0 % (ref 0.0–0.2)

## 2021-09-08 LAB — VITAMIN B12: Vitamin B-12: 191 pg/mL (ref 180–914)

## 2021-09-08 LAB — IRON AND TIBC
Iron: 91 ug/dL (ref 41–142)
Saturation Ratios: 34 % (ref 21–57)
TIBC: 264 ug/dL (ref 236–444)
UIBC: 173 ug/dL (ref 120–384)

## 2021-09-08 LAB — FERRITIN: Ferritin: 50 ng/mL (ref 11–307)

## 2021-09-08 MED ORDER — CYANOCOBALAMIN 1000 MCG/ML IJ SOLN
1000.0000 ug | Freq: Once | INTRAMUSCULAR | Status: AC
  Administered 2021-09-08: 1000 ug via INTRAMUSCULAR
  Filled 2021-09-08: qty 1

## 2021-09-08 NOTE — Progress Notes (Signed)
[facility] [phone]   [phone]  OFFICE PROGRESS NOTE  [name], [address] [place] [address]  DIAGNOSIS:  Persistent microcytic anemia secondary to iron deficiency of unclear etiology but could be secondary to malabsorption or occult gastrointestinal bleeding.  She underwent upper endoscopy and colonoscopy that were unremarkable.  PRIOR THERAPY:  1) Venofer 300 mg IV weekly for 3 weeks.   CURRENT THERAPY: Vitamin B12 injection 1000 mcg intramuscular weekly for 4 weeks and then monthly.  INTERVAL HISTORY: Danielle [name] 86 y.o. female returns to the clinic today for follow-up visit.  The patient is feeling fine today with no concerning complaints except for occasional episodes of fatigue that does not last for long.  She denied having any current chest pain, shortness of breath, cough or hemoptysis.  She denied having any fever or chills.  She has no nausea, vomiting, diarrhea or constipation.  She has no headache or visual changes.  She is currently on vitamin B12 injection on monthly basis.  She is here today for evaluation with repeat anemia panel.  MEDICAL HISTORY: Past Medical History:  Diagnosis Date   Anemia    Anxiety    Arrhythmia    Arthritis    Atrial fibrillation ([place])    Cataract    bilateral   Colon polyps    Diabetes ([place])    Diabetes mellitus without complication ([facility])    type two   GERD (gastroesophageal reflux disease)    Hypercholesteremia    Hypertension    Iron deficiency anemia    Pancreatitis    Peripheral neuropathy    Squamous cell skin cancer     ALLERGIES:  is allergic to amoxicillin, hydrochlorothiazide, cleocin [clindamycin hcl], codeine, and penicillins.  MEDICATIONS:  Current Outpatient Medications  Medication Sig Dispense Refill   atorvastatin (LIPITOR) 20 MG tablet Take 10 mg by mouth daily.     calcium carbonate (OSCAL) 1500 (600 Ca) MG TABS tablet Take  600 mg of elemental calcium by mouth 2 (two) times daily with a meal.     calcium carbonate (TUMS - DOSED IN MG ELEMENTAL CALCIUM) 500 MG chewable tablet Chew 2 tablets by mouth as needed for indigestion or heartburn.     Cholecalciferol (VITAMIN D3) 1000 UNITS CAPS Take 1,000 Units by mouth in the morning and at bedtime.     cyanocobalamin (,VITAMIN B-12,) 1000 MCG/ML injection Inject into the muscle.     diltiazem (CARDIZEM CD) 120 MG 24 hr capsule Take 1 capsule (120 mg total) by mouth daily. 90 capsule 3   FERROUS SULFATE ER PO Take by mouth.     gabapentin (NEURONTIN) 300 MG capsule Take 900 mg by mouth at bedtime.     Glucosamine-Chondroit-Vit C-Mn (GLUCOSAMINE CHONDR 1500 COMPLX PO) Take 2 capsules by mouth in the morning and at bedtime.     insulin glargine (LANTUS) 100 UNIT/ML injection Inject 22-24 Units into the skin at bedtime.     metFORMIN (GLUCOPHAGE) 1000 MG tablet Take 1,000 mg by mouth 2 (two) times daily.     metFORMIN (GLUMETZA) 1000 MG (MOD) 24 hr tablet Take 1,000 mg by mouth 2 (two) times daily with a meal.     metoprolol succinate (TOPROL-XL) 50 MG 24 hr tablet Take 1 tablet (50 mg total) by mouth in the morning and at bedtime. Take with or immediately following a meal. 180 tablet 3   omeprazole (PRILOSEC) 40 MG capsule Take 1 capsule  by mouth daily.     traMADol (ULTRAM) 50 MG tablet Take 50 mg by mouth 3 (three) times daily as needed.     Turmeric (QC TUMERIC COMPLEX) 500 MG CAPS Take by mouth.     XARELTO 20 MG TABS tablet TAKE 1 TABLET BY MOUTH ONCE DAILY WITH SUPPER 90 tablet 1   No current facility-administered medications for this visit.    SURGICAL HISTORY:  Past Surgical History:  Procedure Laterality Date   APPENDECTOMY  1957   BREAST EXCISIONAL BIOPSY Right 1966   CATARACT EXTRACTION, BILATERAL     CHOLECYSTECTOMY  10/2008   lump removed right breast  1958   bENIGN   right ankle plate  [id number]   VAGINAL HYSTERECTOMY  1978    REVIEW OF SYSTEMS:  A  comprehensive review of systems was negative.   PHYSICAL EXAMINATION: General appearance: alert, cooperative, and no distress Head: Normocephalic, without obvious abnormality, atraumatic Neck: no adenopathy, no JVD, supple, symmetrical, trachea midline, and thyroid not enlarged, symmetric, no tenderness/mass/nodules Lymph nodes: Cervical, supraclavicular, and axillary nodes normal. Resp: clear to auscultation bilaterally Back: symmetric, no curvature. ROM normal. No CVA tenderness. Cardio: regular rate and rhythm, S1, S2 normal, no murmur, click, rub or gallop GI: soft, non-tender; bowel sounds normal; no masses,  no organomegaly Extremities: extremities normal, atraumatic, no cyanosis or edema  ECOG PERFORMANCE STATUS: 1 - Symptomatic but completely ambulatory  Blood pressure (!) 152/94, pulse 98, temperature 98.4 F (36.9 C), temperature source Tympanic, resp. rate 19, height 5\' 3"  (1.6 m), weight 163 lb 14.4 oz (74.3 kg), SpO2 99 %.  LABORATORY DATA: Lab Results  Component Value Date   WBC 7.9 09/08/2021   HGB 13.3 09/08/2021   HCT 40.8 09/08/2021   MCV 94.2 09/08/2021   PLT 251 09/08/2021      Chemistry      Component Value Date/Time   NA 140 04/27/2021 1112   NA 139 02/26/2021 1100   K 4.4 04/27/2021 1112   CL 106 04/27/2021 1112   CO2 24 04/27/2021 1112   BUN 12 04/27/2021 1112   BUN 12 02/26/2021 1100   CREATININE 0.91 04/27/2021 1112      Component Value Date/Time   CALCIUM 8.9 04/27/2021 1112   ALKPHOS 66 04/27/2021 1112   AST 28 04/27/2021 1112   ALT 27 04/27/2021 1112   BILITOT 0.7 04/27/2021 1112       RADIOGRAPHIC STUDIES: No results found.  ASSESSMENT AND PLAN: This is a very pleasant 86 years old white female with history of microcytic anemia secondary to iron deficiency of unclear etiology but likely from gastrointestinal blood loss.  The patient was treated with Venofer 300 mg IV weekly for 3 weeks and tolerated her treatment well.  She was also  found to have vitamin B12 deficiency and she received vitamin B12 injection weekly for 4 weeks. Repeat CBC today was normal with no significant anemia, leukocytopenia or thrombocytopenia. Iron study, ferritin as well as vitamin B12 levels are still pending. I recommended for the patient to continue with the monthly vitamin B12 injection in addition to over-the-counter oral iron tablets. The patient will come back for follow-up visit in 6 months for evaluation and repeat blood work. She was advised to call immediately if she has any other concerning symptoms in the interval. The patient voices understanding of current disease status and treatment options and is in agreement with the current care plan.  All questions were answered. The patient knows to call the clinic  with any problems, questions or concerns. We can certainly see the patient much sooner if necessary.   Disclaimer: This note was dictated with voice recognition software. Similar sounding words can inadvertently be transcribed and may not be corrected upon review.

## 2021-09-30 DIAGNOSIS — M25552 Pain in left hip: Secondary | ICD-10-CM | POA: Diagnosis not present

## 2021-09-30 DIAGNOSIS — L851 Acquired keratosis [keratoderma] palmaris et plantaris: Secondary | ICD-10-CM | POA: Diagnosis not present

## 2021-09-30 DIAGNOSIS — Z794 Long term (current) use of insulin: Secondary | ICD-10-CM | POA: Diagnosis not present

## 2021-09-30 DIAGNOSIS — B351 Tinea unguium: Secondary | ICD-10-CM | POA: Diagnosis not present

## 2021-09-30 DIAGNOSIS — E1140 Type 2 diabetes mellitus with diabetic neuropathy, unspecified: Secondary | ICD-10-CM | POA: Diagnosis not present

## 2021-09-30 DIAGNOSIS — M1612 Unilateral primary osteoarthritis, left hip: Secondary | ICD-10-CM | POA: Diagnosis not present

## 2021-10-06 ENCOUNTER — Inpatient Hospital Stay: Payer: Medicare Other | Attending: Internal Medicine

## 2021-10-06 ENCOUNTER — Other Ambulatory Visit: Payer: Self-pay

## 2021-10-06 DIAGNOSIS — D500 Iron deficiency anemia secondary to blood loss (chronic): Secondary | ICD-10-CM

## 2021-10-06 DIAGNOSIS — D531 Other megaloblastic anemias, not elsewhere classified: Secondary | ICD-10-CM

## 2021-10-06 DIAGNOSIS — E538 Deficiency of other specified B group vitamins: Secondary | ICD-10-CM | POA: Diagnosis not present

## 2021-10-06 MED ORDER — CYANOCOBALAMIN 1000 MCG/ML IJ SOLN
1000.0000 ug | INTRAMUSCULAR | Status: DC
  Administered 2021-10-06: 15:00:00 1000 ug via INTRAMUSCULAR
  Filled 2021-10-06: qty 1

## 2021-10-28 DIAGNOSIS — J069 Acute upper respiratory infection, unspecified: Secondary | ICD-10-CM | POA: Diagnosis not present

## 2021-10-29 DIAGNOSIS — Z03818 Encounter for observation for suspected exposure to other biological agents ruled out: Secondary | ICD-10-CM | POA: Diagnosis not present

## 2021-10-29 DIAGNOSIS — J3489 Other specified disorders of nose and nasal sinuses: Secondary | ICD-10-CM | POA: Diagnosis not present

## 2021-10-29 DIAGNOSIS — J069 Acute upper respiratory infection, unspecified: Secondary | ICD-10-CM | POA: Diagnosis not present

## 2021-10-29 DIAGNOSIS — R519 Headache, unspecified: Secondary | ICD-10-CM | POA: Diagnosis not present

## 2021-11-03 ENCOUNTER — Inpatient Hospital Stay: Payer: Medicare Other | Attending: Internal Medicine

## 2021-11-03 ENCOUNTER — Other Ambulatory Visit: Payer: Self-pay

## 2021-11-03 DIAGNOSIS — D531 Other megaloblastic anemias, not elsewhere classified: Secondary | ICD-10-CM

## 2021-11-03 DIAGNOSIS — E538 Deficiency of other specified B group vitamins: Secondary | ICD-10-CM | POA: Diagnosis not present

## 2021-11-03 DIAGNOSIS — D500 Iron deficiency anemia secondary to blood loss (chronic): Secondary | ICD-10-CM

## 2021-11-03 MED ORDER — CYANOCOBALAMIN 1000 MCG/ML IJ SOLN
1000.0000 ug | INTRAMUSCULAR | Status: DC
  Administered 2021-11-03: 1000 ug via INTRAMUSCULAR
  Filled 2021-11-03: qty 1

## 2021-11-09 ENCOUNTER — Ambulatory Visit: Payer: Medicare Other | Admitting: Internal Medicine

## 2021-11-26 ENCOUNTER — Encounter: Payer: Self-pay | Admitting: Internal Medicine

## 2021-11-26 ENCOUNTER — Ambulatory Visit (INDEPENDENT_AMBULATORY_CARE_PROVIDER_SITE_OTHER): Payer: Medicare Other | Admitting: Internal Medicine

## 2021-11-26 VITALS — BP 138/76 | HR 76 | Temp 97.9°F | Resp 16 | Ht 64.0 in | Wt 166.3 lb

## 2021-11-26 DIAGNOSIS — D531 Other megaloblastic anemias, not elsewhere classified: Secondary | ICD-10-CM | POA: Diagnosis not present

## 2021-11-26 DIAGNOSIS — I4891 Unspecified atrial fibrillation: Secondary | ICD-10-CM

## 2021-11-26 DIAGNOSIS — R61 Generalized hyperhidrosis: Secondary | ICD-10-CM | POA: Diagnosis not present

## 2021-11-26 DIAGNOSIS — R32 Unspecified urinary incontinence: Secondary | ICD-10-CM

## 2021-11-26 DIAGNOSIS — I10 Essential (primary) hypertension: Secondary | ICD-10-CM | POA: Insufficient documentation

## 2021-11-26 DIAGNOSIS — E1142 Type 2 diabetes mellitus with diabetic polyneuropathy: Secondary | ICD-10-CM

## 2021-11-26 DIAGNOSIS — Z794 Long term (current) use of insulin: Secondary | ICD-10-CM | POA: Insufficient documentation

## 2021-11-26 DIAGNOSIS — E782 Mixed hyperlipidemia: Secondary | ICD-10-CM

## 2021-11-26 DIAGNOSIS — E785 Hyperlipidemia, unspecified: Secondary | ICD-10-CM | POA: Insufficient documentation

## 2021-11-26 DIAGNOSIS — K219 Gastro-esophageal reflux disease without esophagitis: Secondary | ICD-10-CM

## 2021-11-26 DIAGNOSIS — M899 Disorder of bone, unspecified: Secondary | ICD-10-CM | POA: Insufficient documentation

## 2021-11-26 DIAGNOSIS — E1165 Type 2 diabetes mellitus with hyperglycemia: Secondary | ICD-10-CM

## 2021-11-26 DIAGNOSIS — E2839 Other primary ovarian failure: Secondary | ICD-10-CM | POA: Insufficient documentation

## 2021-11-26 DIAGNOSIS — D500 Iron deficiency anemia secondary to blood loss (chronic): Secondary | ICD-10-CM

## 2021-11-26 DIAGNOSIS — H903 Sensorineural hearing loss, bilateral: Secondary | ICD-10-CM

## 2021-11-26 DIAGNOSIS — Z1231 Encounter for screening mammogram for malignant neoplasm of breast: Secondary | ICD-10-CM | POA: Diagnosis not present

## 2021-11-26 MED ORDER — ATORVASTATIN CALCIUM 10 MG PO TABS: 10.0000 mg | ORAL_TABLET | Freq: Every day | ORAL | 3 refills | Status: DC

## 2021-11-26 NOTE — Assessment & Plan Note (Addendum)
Stable, continue current medications. Due for routine blood work including CBC, CMP.

## 2021-11-26 NOTE — Assessment & Plan Note (Signed)
Stable

## 2021-11-26 NOTE — Assessment & Plan Note (Signed)
Stable, following with Cardiology.

## 2021-11-26 NOTE — Assessment & Plan Note (Signed)
Stable, following with Hematology.

## 2021-11-26 NOTE — Assessment & Plan Note (Signed)
Stable, continue PPI

## 2021-11-26 NOTE — Assessment & Plan Note (Signed)
Stable, continue statin and recheck lipid panel today.

## 2021-11-26 NOTE — Patient Instructions (Addendum)
It was great seeing you today!  Plan discussed at today's visit: -Blood work ordered today, results will be uploaded to [place] sent to pharmacy  -Mammogram ordered today   Follow up in: 3 months   Take care and let us know if you have any questions or concerns prior to your next visit.  Dr. Rosana Berger  Kegel Exercises The bones of the pelvic area showing the pelvic floor muscles attached to the pelvis.  Kegel exercises can help strengthen your pelvic floor muscles. The pelvic floor is a group of muscles that support your rectum, small intestine, and bladder. In females, pelvic floor muscles also help support the uterus. These muscles help you control the flow of urine and stool (feces). Kegel exercises are painless and simple. They do not require any equipment. Your provider may suggest Kegel exercises to: Improve bladder and bowel control. Improve sexual response. Improve weak pelvic floor muscles after surgery to remove the uterus (hysterectomy) or after pregnancy, in females. Improve weak pelvic floor muscles after prostate gland removal or surgery, in males. Kegel exercises involve squeezing your pelvic floor muscles. These are the same muscles you squeeze when you try to stop the flow of urine or keep from passing gas. The exercises can be done while sitting, standing, or lying down, but it is best to vary your position. Ask your health care provider which exercises are safe for you. Do exercises exactly as told by your health care provider and adjust them as directed. Do not begin these exercises until told by your health care provider. Exercises How to do Kegel exercises: Squeeze your pelvic floor muscles tight. You should feel a tight lift in your rectal area. If you are a female, you should also feel a tightness in your vaginal area. Keep your stomach, buttocks, and legs relaxed. Hold the muscles tight for up to 10 seconds. Breathe normally. Relax your muscles for up to  10 seconds. Repeat as told by your health care provider. Repeat this exercise daily as told by your health care provider. Continue to do this exercise for at least 4-6 weeks, or for as long as told by your health care provider. You may be referred to a physical therapist who can help you learn more about how to do Kegel exercises. Depending on your condition, your health care provider may recommend: Varying how long you squeeze your muscles. Doing several sets of exercises every day. Doing exercises for several weeks. Making Kegel exercises a part of your regular exercise routine. This information is not intended to replace advice given to you by your health care provider. Make sure you discuss any questions you have with your health care provider. Document Revised: 02/18/2021 Document Reviewed: 02/18/2021 Elsevier Patient Education  2022 [employer].

## 2021-11-26 NOTE — Assessment & Plan Note (Signed)
Doing well on medications, blood sugars at home look good. Uncertain when last I3D was, will recheck today and urine as well.

## 2021-11-26 NOTE — Progress Notes (Signed)
New Patient Office Visit  Subjective:  Patient ID: Danielle [name], female    DOB: April 27, 1935  Age: 87 y.o. MRN: [id number]  CC:  Chief Complaint  Patient presents with   Establish Care   Medication Refill   Hyperlipidemia   Hypertension   Diabetes    HPI Danielle [name] presents to establish care.   Hypertension/A.Fib: -Medications: Diltiazem 120, Metoprolol 50, Xarelto 20 -Follows with Cardiology, next appointment next week -Patient is compliant with above medications and reports no side effects. -Checking BP at home (average): doesn't check  -Denies any SOB, CP, vision changes, LE edema or symptoms of hypotension  HLD: -Medications: Lipitor 10 -Patient is compliant with above medications and reports no side effects.  -Last lipid panel: 10/2008 Lipid Panel     Component Value Date/Time   CHOL  11/10/2008 0317    159        ATP III CLASSIFICATION:  <200     mg/dL   Desirable  200-239  mg/dL   Borderline High  >=240    mg/dL   High          TRIG 224 (H) 11/10/2008 0317   HDL 42 11/10/2008 0317   CHOLHDL 3.8 11/10/2008 0317   VLDL 45 (H) 11/10/2008 0317   LDLCALC  11/10/2008 0317    72        Total Cholesterol/HDL:CHD Risk Coronary Heart Disease Risk Table                     Men   Women  1/2 Average Risk   3.4   3.3  Average Risk       5.0   4.4  2 X Average Risk   9.6   7.1  3 X Average Risk  23.4   11.0        Use the calculated Patient Ratio above and the CHD Risk Table to determine the patient's CHD Risk.        ATP III CLASSIFICATION (LDL):  <100     mg/dL   Optimal  100-129  mg/dL   Near or Above                    Optimal  130-159  mg/dL   Borderline  160-189  mg/dL   High  >190     mg/dL   Very High    Diabetes, Type 2 with Neuropathy: -Last A1c 8.1 last year uncertain when -Medications: Metformin 1000 BID, Lantus 24-26 units at night, Gabapentin 900 mg  -Patient is compliant with the above medications and reports no side effects.  -Checking  BG at home: fasting 85-130 -Highest home BG since last visit: 135 -Lowest home BG since last visit: 71 -Eye exam: Yearly, last exam was in the fall of 22 -Foot exam: Follows with Podiatry, Dr. Jens Som every 3 months  -Microalbumin: due -Statin: yes -PNA vaccine: yes -Denies symptoms of hypoglycemia, polyuria, polydipsia, numbness extremities, foot ulcers/trauma.   Iron Deficiency Anemia/Vitamin B12 deficiency: -Had been on IV iron in the past, but not on any more -Following with Hematology, will see next in May  GERD: -Currently on Prilosec 40, doing well  Sensorineural Hearing Loss in left ear due to [place] maintenance: -Blood work due -Mammogram 1/21 Birads 1, due today -Colonoscopy 6/22, repeat every 5 years  Noticed some increased sweating recently, moreso in the day when she is particularly active like cooking or cleaning, but is sweating profusely  to wear its drenching her hair and neck which is abnormal for her. Denies night sweats/weight changes.   Incontinence: increased recently, noticed when sitting for long periods of time and she stands up her loses control of her bladder. She now has to wear a maxi pad. Doesn't noticed much change with cough/sneezing. Has tried [employer] . Doesn't noticed overactive bladder. Hasn't noticed small voids or incomplete emptying, no dysuria, hematuria, suprapubic pain, etc.   Past Medical History:  Diagnosis Date   Anemia    Anxiety    Arrhythmia    Arthritis    Atrial fibrillation (HCC)    Cataract    bilateral   Colon polyps    Diabetes ([place])    Diabetes mellitus without complication (HCC)    type two   GERD (gastroesophageal reflux disease)    Hypercholesteremia    Hypertension    Iron deficiency anemia    Pancreatitis    Peripheral neuropathy    Squamous cell skin cancer     Past Surgical History:  Procedure Laterality Date   APPENDECTOMY  1957   BREAST EXCISIONAL BIOPSY Right 1966   CATARACT EXTRACTION, BILATERAL      CHOLECYSTECTOMY  10/2008   lump removed right breast  1958   bENIGN   right ankle plate  0258   VAGINAL HYSTERECTOMY  1978    Family History  Problem Relation Age of Onset   Hypertension Mother    Stroke Mother    Diabetes Mother    Colon cancer Mother 41   Cerebral aneurysm Father    Colon cancer Sister        in her 47s   CAD Sister    Heart attack Sister    Uterine cancer Sister        in her late 49s   Colon cancer Sister        in her 38s   Parkinsonism Sister    Dementia Sister    Heart attack Brother    Heart attack Brother    Throat cancer Brother    Atrial fibrillation Brother     Social History   Socioeconomic History   Marital status: Widowed    Spouse name: Not on file   Number of children: 3   Years of education: Not on file   Highest education level: Not on file  Occupational History   Not on file  Tobacco Use   Smoking status: Never   Smokeless tobacco: Never  Vaping Use   Vaping Use: Never used  Substance and Sexual Activity   Alcohol use: No    Alcohol/week: 0.0 standard drinks   Drug use: No   Sexual activity: Not Currently  Other Topics Concern   Not on file  Social History Narrative   Not on file   Social Determinants of Health   Financial Resource Strain: Not on file  Food Insecurity: Not on file  Transportation Needs: Not on file  Physical Activity: Not on file  Stress: Not on file  Social Connections: Not on file  Intimate Partner Violence: Not on file    ROS Review of Systems  Constitutional:  Negative for activity change, appetite change, chills, fever and unexpected weight change.  HENT:  Positive for hearing loss.   Eyes:  Negative for visual disturbance.  Respiratory:  Negative for cough and shortness of breath.   Cardiovascular:  Negative for chest pain, palpitations and leg swelling.  Gastrointestinal:  Negative for abdominal pain, nausea and vomiting.  Genitourinary:  Negative for dysuria, flank pain,  frequency and hematuria.  Skin: Negative.   Neurological:  Negative for dizziness and headaches.   Objective:   Today's Vitals: BP 138/76    Pulse 76    Temp 97.9 F (36.6 C)    Resp 16    Ht 5\' 4"  (1.626 m)    Wt 166 lb 4.8 oz (75.4 kg)    SpO2 98%    BMI 28.55 kg/m   Physical Exam Constitutional:      Appearance: Normal appearance.  HENT:     Head: Normocephalic and atraumatic.     Nose: Nose normal.     Mouth/Throat:     Mouth: Mucous membranes are moist.     Pharynx: Oropharynx is clear.  Eyes:     Conjunctiva/sclera: Conjunctivae normal.  Cardiovascular:     Rate and Rhythm: Normal rate and regular rhythm.  Pulmonary:     Effort: Pulmonary effort is normal.     Breath sounds: Normal breath sounds.  Musculoskeletal:     Right lower leg: No edema.     Left lower leg: No edema.  Skin:    General: Skin is warm and dry.  Neurological:     General: No focal deficit present.     Mental Status: She is alert. Mental status is at baseline.  Psychiatric:        Mood and Affect: Mood normal.        Behavior: Behavior normal.    Assessment & Plan:   Problem List Items Addressed This Visit       Cardiovascular and Mediastinum   Atrial fibrillation ([place])    Stable, following with Cardiology.       Relevant Medications   metoprolol succinate (TOPROL-XL) 50 MG 24 hr tablet   diltiazem (DILACOR XR) 120 MG 24 hr capsule   atorvastatin (LIPITOR) 10 MG tablet   Essential hypertension    Stable, continue current medications. Due for routine blood work including CBC, CMP.       Relevant Medications   metoprolol succinate (TOPROL-XL) 50 MG 24 hr tablet   diltiazem (DILACOR XR) 120 MG 24 hr capsule   atorvastatin (LIPITOR) 10 MG tablet     Digestive   GERD (gastroesophageal reflux disease)    Stable, continue PPI.        Endocrine   Diabetic peripheral neuropathy associated with type 2 diabetes mellitus ([place])    Doing well on medications, blood sugars at home look  good. Uncertain when last C1K was, will recheck today and urine as well.      Relevant Medications   atorvastatin (LIPITOR) 10 MG tablet     Nervous and Auditory   Asymmetric SNHL (sensorineural hearing loss)    Stable.         Other   Iron deficiency anemia due to chronic blood loss    Stable, following with Hematology.       Vitamin B12 deficient megaloblastic anemia    Stable, following with Hematology.       Mixed hyperlipidemia    Stable, continue statin and recheck lipid panel today.       Relevant Medications   metoprolol succinate (TOPROL-XL) 50 MG 24 hr tablet   diltiazem (DILACOR XR) 120 MG 24 hr capsule   atorvastatin (LIPITOR) 10 MG tablet   Other Visit Diagnoses     Hypertension, unspecified type    -  Primary   Relevant Medications   metoprolol succinate (TOPROL-XL) 50 MG 24 hr  tablet   diltiazem (DILACOR XR) 120 MG 24 hr capsule   atorvastatin (LIPITOR) 10 MG tablet   Other Relevant Orders   CBC w/Diff/Platelet   COMPLETE METABOLIC PANEL WITH GFR   Hyperlipidemia, unspecified hyperlipidemia type       Relevant Medications   metoprolol succinate (TOPROL-XL) 50 MG 24 hr tablet   diltiazem (DILACOR XR) 120 MG 24 hr capsule   atorvastatin (LIPITOR) 10 MG tablet   Other Relevant Orders   Lipid Profile   Type 2 diabetes mellitus with hyperglycemia, with long-term current use of insulin (HCC)       Relevant Medications   atorvastatin (LIPITOR) 10 MG tablet   Other Relevant Orders   CBC w/Diff/Platelet   COMPLETE METABOLIC PANEL WITH GFR   HgB A1c   Urine Microalbumin w/creat. ratio   Encounter for screening mammogram for malignant neoplasm of breast       Relevant Orders   MM Digital Screening   Urinary incontinence, unspecified type: symptoms sound like overflow incontinence, would require urodynamic testing but the patient would like to try Kegels first. Follow up in 3 months to recheck.       Excessive sweating: No night sweats, weight changes.  Will check blood work.        Outpatient Encounter Medications as of 11/26/2021  Medication Sig   atorvastatin (LIPITOR) 10 MG tablet Take 10 mg by mouth daily.   calcium carbonate (OSCAL) 1500 (600 Ca) MG TABS tablet Take 600 mg of elemental calcium by mouth 2 (two) times daily with a meal.   calcium carbonate (TUMS - DOSED IN MG ELEMENTAL CALCIUM) 500 MG chewable tablet Chew 2 tablets by mouth as needed for indigestion or heartburn.   Cholecalciferol (VITAMIN D3) 1000 UNITS CAPS Take 1,000 Units by mouth in the morning and at bedtime.   cyanocobalamin (,VITAMIN B-12,) 1000 MCG/ML injection Inject into the muscle.   diltiazem (DILACOR XR) 120 MG 24 hr capsule Take 120 mg by mouth daily.   gabapentin (NEURONTIN) 300 MG capsule Take 900 mg by mouth at bedtime.   Glucosamine-Chondroit-Vit C-Mn (GLUCOSAMINE CHONDR 1500 COMPLX PO) Take 2 capsules by mouth in the morning and at bedtime.   insulin glargine (LANTUS) 100 UNIT/ML injection Inject 24-26 Units into the skin at bedtime.   metFORMIN (GLUCOPHAGE) 1000 MG tablet Take 1,000 mg by mouth 2 (two) times daily.   metoprolol succinate (TOPROL-XL) 50 MG 24 hr tablet Take 1 tablet by mouth in the morning and at bedtime.   omeprazole (PRILOSEC) 40 MG capsule Take 1 capsule by mouth daily.   XARELTO 20 MG TABS tablet TAKE 1 TABLET BY MOUTH ONCE DAILY WITH SUPPER   [DISCONTINUED] atorvastatin (LIPITOR) 20 MG tablet Take 10 mg by mouth daily.   [DISCONTINUED] metFORMIN (GLUMETZA) 1000 MG (MOD) 24 hr tablet Take 1,000 mg by mouth 2 (two) times daily with a meal.   [DISCONTINUED] diltiazem (CARDIZEM CD) 120 MG 24 hr capsule Take 1 capsule (120 mg total) by mouth daily.   [DISCONTINUED] FERROUS SULFATE ER PO Take by mouth.   [DISCONTINUED] metoprolol succinate (TOPROL-XL) 50 MG 24 hr tablet Take 1 tablet (50 mg total) by mouth in the morning and at bedtime. Take with or immediately following a meal.   [DISCONTINUED] traMADol (ULTRAM) 50 MG tablet Take 50 mg  by mouth 3 (three) times daily as needed.   [DISCONTINUED] Turmeric (QC TUMERIC COMPLEX) 500 MG CAPS Take by mouth.   No facility-administered encounter medications on file as of 11/26/2021.  Follow-up: Return in about 3 months (around 02/23/2022).   [name], DO

## 2021-11-27 LAB — COMPLETE METABOLIC PANEL WITH GFR
AG Ratio: 1.6 (calc) (ref 1.0–2.5)
ALT: 16 U/L (ref 6–29)
AST: 14 U/L (ref 10–35)
Albumin: 4.2 g/dL (ref 3.6–5.1)
Alkaline phosphatase (APISO): 49 U/L (ref 37–153)
BUN: 11 mg/dL (ref 7–25)
CO2: 31 mmol/L (ref 20–32)
Calcium: 9.4 mg/dL (ref 8.6–10.4)
Chloride: 102 mmol/L (ref 98–110)
Creat: 0.77 mg/dL (ref 0.60–0.95)
Globulin: 2.7 g/dL (calc) (ref 1.9–3.7)
Glucose, Bld: 109 mg/dL — ABNORMAL HIGH (ref 65–99)
Potassium: 4.3 mmol/L (ref 3.5–5.3)
Sodium: 141 mmol/L (ref 135–146)
Total Bilirubin: 0.7 mg/dL (ref 0.2–1.2)
Total Protein: 6.9 g/dL (ref 6.1–8.1)
eGFR: 76 mL/min/{1.73_m2} (ref >=60)

## 2021-11-27 LAB — CBC WITH DIFFERENTIAL/PLATELET
Absolute Monocytes: 533 cells/uL (ref 200–950)
Basophils Absolute: 41 cells/uL (ref 0–200)
Basophils Relative: 0.5 %
Eosinophils Absolute: 238 cells/uL (ref 15–500)
Eosinophils Relative: 2.9 %
HCT: 41.5 % (ref 35.0–45.0)
Hemoglobin: 13.8 g/dL (ref 11.7–15.5)
Lymphs Abs: 2124 cells/uL (ref 850–3900)
MCH: 32 pg (ref 27.0–33.0)
MCHC: 33.3 g/dL (ref 32.0–36.0)
MCV: 96.3 fL (ref 80.0–100.0)
MPV: 10.4 fL (ref 7.5–12.5)
Monocytes Relative: 6.5 %
Neutro Abs: 5264 cells/uL (ref 1500–7800)
Neutrophils Relative %: 64.2 %
Platelets: 253 10*3/uL (ref 140–400)
RBC: 4.31 10*6/uL (ref 3.80–5.10)
RDW: 11.8 % (ref 11.0–15.0)
Total Lymphocyte: 25.9 %
WBC: 8.2 10*3/uL (ref 3.8–10.8)

## 2021-11-27 LAB — LIPID PANEL
Cholesterol: 108 mg/dL (ref <200)
HDL: 44 mg/dL — ABNORMAL LOW (ref >=50)
LDL Cholesterol (Calc): 37 mg/dL (calc)
Non-HDL Cholesterol (Calc): 64 mg/dL (calc) (ref <130)
Total CHOL/HDL Ratio: 2.5 (calc) (ref <5.0)
Triglycerides: 203 mg/dL — ABNORMAL HIGH (ref <150)

## 2021-11-27 LAB — HEMOGLOBIN A1C
Hgb A1c MFr Bld: 7.3 % of total Hgb — ABNORMAL HIGH (ref <5.7)
Mean Plasma Glucose: 163 mg/dL
eAG (mmol/L): 9 mmol/L

## 2021-11-27 LAB — MICROALBUMIN / CREATININE URINE RATIO
Creatinine, Urine: 104 mg/dL (ref 20–275)
Microalb Creat Ratio: 20 mcg/mg creat (ref <30)
Microalb, Ur: 2.1 mg/dL

## 2021-12-01 ENCOUNTER — Inpatient Hospital Stay: Payer: Medicare Other | Attending: Internal Medicine

## 2021-12-01 ENCOUNTER — Other Ambulatory Visit: Payer: Self-pay

## 2021-12-01 DIAGNOSIS — E538 Deficiency of other specified B group vitamins: Secondary | ICD-10-CM | POA: Insufficient documentation

## 2021-12-01 DIAGNOSIS — D500 Iron deficiency anemia secondary to blood loss (chronic): Secondary | ICD-10-CM

## 2021-12-01 DIAGNOSIS — D531 Other megaloblastic anemias, not elsewhere classified: Secondary | ICD-10-CM

## 2021-12-01 MED ORDER — CYANOCOBALAMIN 1000 MCG/ML IJ SOLN
1000.0000 ug | INTRAMUSCULAR | Status: DC
  Administered 2021-12-01: 1000 ug via INTRAMUSCULAR
  Filled 2021-12-01: qty 1

## 2021-12-05 NOTE — Progress Notes (Signed)
Cardiology Office Note:   Date:  12/06/2021  NAME:  Danielle [name]    MRN: [id number] DOB:  05-23-1935   PCP:  [name], DO  Cardiologist:  None  Electrophysiologist:  None   Referring MD: Glenis Smoker, *   Chief Complaint  Patient presents with   Follow-up         History of Present Illness:   Danielle [name] is a 87 y.o. female with a hx of persistent Afib, DM, HLD, HTN who presents for follow-up.  She reports she overall feels well.  Denies any rapid heartbeat sensation.  No chest pain or trouble breathing.  She does report she has low energy.  She is getting B12 infusions.  Suspect some of her symptoms of fatigue are related to this.  She does have chronic B12 deficiency.  BP 152/82.  She reports values are controlled at other times.  Denies any major symptoms in office.  Oxygen level is well controlled.  Heart rate 98.  We again discussed her need for medications to slow her heart rate.  We also discussed need for anticoagulation.  Xarelto is costly but she understands the need.  Problem List 1. DM -A1c 7.3 2. HLD -T chol 108, HDL 44, LDL 37, TG 203 3. HTN 4. Persistent Afib -Dx 02/26/2021 -CHADSVASC=5 (age, female, DM, HTN) 5. Iron deficiency anemia -normal colonoscopy/EGD -HGB 12.8 6. RBBB/LAFB  Past Medical History: Past Medical History:  Diagnosis Date   Anemia    Anxiety    Arrhythmia    Arthritis    Atrial fibrillation ([place])    Cataract    bilateral   Colon polyps    Diabetes ([place])    Diabetes mellitus without complication ([place])    type two   GERD (gastroesophageal reflux disease)    Hypercholesteremia    Hypertension    Iron deficiency anemia    Pancreatitis    Peripheral neuropathy    Squamous cell skin cancer     Past Surgical History: Past Surgical History:  Procedure Laterality Date   APPENDECTOMY  1957   BREAST EXCISIONAL BIOPSY Right 1966   CATARACT EXTRACTION, BILATERAL     CHOLECYSTECTOMY  10/2008   lump removed right  breast  1958   bENIGN   right ankle plate  3299   VAGINAL HYSTERECTOMY  1978    Current Medications: Current Meds  Medication Sig   atorvastatin (LIPITOR) 10 MG tablet Take 1 tablet (10 mg total) by mouth daily.   calcium carbonate (OSCAL) 1500 (600 Ca) MG TABS tablet Take 600 mg of elemental calcium by mouth 2 (two) times daily with a meal.   calcium carbonate (TUMS - DOSED IN MG ELEMENTAL CALCIUM) 500 MG chewable tablet Chew 2 tablets by mouth as needed for indigestion or heartburn.   Cholecalciferol (VITAMIN D3) 1000 UNITS CAPS Take 1,000 Units by mouth in the morning and at bedtime.   cyanocobalamin (,VITAMIN B-12,) 1000 MCG/ML injection Inject into the muscle.   diltiazem (DILACOR XR) 120 MG 24 hr capsule Take 120 mg by mouth daily.   gabapentin (NEURONTIN) 300 MG capsule Take 900 mg by mouth at bedtime.   Glucosamine-Chondroit-Vit C-Mn (GLUCOSAMINE CHONDR 1500 COMPLX PO) Take 2 capsules by mouth in the morning and at bedtime.   insulin glargine (LANTUS) 100 UNIT/ML injection Inject 24-26 Units into the skin at bedtime.   metFORMIN (GLUCOPHAGE) 1000 MG tablet Take 1,000 mg by mouth 2 (two) times daily.   metoprolol succinate (TOPROL-XL) 50 MG  24 hr tablet Take 1 tablet by mouth in the morning and at bedtime.   omeprazole (PRILOSEC) 40 MG capsule Take 1 capsule by mouth daily.   XARELTO 20 MG TABS tablet TAKE 1 TABLET BY MOUTH ONCE DAILY WITH SUPPER    Allergies:    Amoxicillin, Hydrochlorothiazide, Cleocin [clindamycin hcl], Codeine, and Penicillins   Social History: Social History   Socioeconomic History   Marital status: Widowed    Spouse name: Not on file   Number of children: 3   Years of education: Not on file   Highest education level: Not on file  Occupational History   Not on file  Tobacco Use   Smoking status: Never   Smokeless tobacco: Never  Vaping Use   Vaping Use: Never used  Substance and Sexual Activity   Alcohol use: No    Alcohol/week: 0.0 standard  drinks   Drug use: No   Sexual activity: Not Currently  Other Topics Concern   Not on file  Social History Narrative   Not on file   Social Determinants of Health   Financial Resource Strain: Not on file  Food Insecurity: Not on file  Transportation Needs: Not on file  Physical Activity: Not on file  Stress: Not on file  Social Connections: Not on file     Family History: The patient's family history includes Atrial fibrillation in her brother; CAD in her sister; Cerebral aneurysm in her father; Colon cancer in her sister and sister; Colon cancer (age of onset: 22) in her mother; Dementia in her sister; Diabetes in her mother; Heart attack in her brother, brother, and sister; Hypertension in her mother; Parkinsonism in her sister; Stroke in her mother; Throat cancer in her brother; Uterine cancer in her sister.  ROS:   All other ROS reviewed and negative. Pertinent positives noted in the HPI.     EKGs/Labs/Other Studies Reviewed:   The following studies were personally reviewed by me today:  TTE 03/23/2021  1. Left ventricular ejection fraction, by estimation, is 55 to 60%. The  left ventricle has normal function. The left ventricle has no regional  wall motion abnormalities. There is mild left ventricular hypertrophy.  Left ventricular diastolic function  could not be evaluated.   2. Right ventricular systolic function is normal. The right ventricular  size is normal. There is moderately elevated pulmonary artery systolic  pressure. The estimated right ventricular systolic pressure is 95.0 mmHg.   3. Left atrial size was severely dilated.   4. The mitral valve is abnormal. Trivial mitral valve regurgitation.   5. Tricuspid valve regurgitation is mild to moderate.   6. The aortic valve is tricuspid. Aortic valve regurgitation is not  visualized. Mild to moderate aortic valve sclerosis/calcification is  present, without any evidence of aortic stenosis.   7. The inferior vena  cava is normal in size with <50% respiratory  variability, suggesting right atrial pressure of 8 mmHg.   Recent Labs: 02/26/2021: TSH 2.070 11/26/2021: ALT 16; BUN 11; Creat 0.77; Hemoglobin 13.8; Platelets 253; Potassium 4.3; Sodium 141   Recent Lipid Panel    Component Value Date/Time   CHOL 108 11/26/2021 1146   TRIG 203 (H) 11/26/2021 1146   HDL 44 (L) 11/26/2021 1146   CHOLHDL 2.5 11/26/2021 1146   VLDL 45 (H) 11/10/2008 0317   LDLCALC 37 11/26/2021 1146    Physical Exam:   VS:  BP (!) 152/82    Pulse 98    Ht 5\' 3"  (1.6 m)  Wt 166 lb 3.2 oz (75.4 kg)    SpO2 96%    BMI 29.44 kg/m    Wt Readings from Last 3 Encounters:  12/06/21 166 lb 3.2 oz (75.4 kg)  11/26/21 166 lb 4.8 oz (75.4 kg)  09/08/21 163 lb 14.4 oz (74.3 kg)    General: Well nourished, well developed, in no acute distress Head: Atraumatic, normal size  Eyes: PEERLA, EOMI  Neck: Supple, no JVD Endocrine: No thryomegaly Cardiac: Normal S1, S2; irregular rhythm, no murmurs rubs or gallops Lungs: Clear to auscultation bilaterally, no wheezing, rhonchi or rales  Abd: Soft, nontender, no hepatomegaly  Ext: No edema, pulses 2+ Musculoskeletal: No deformities, BUE and BLE strength normal and equal Skin: Warm and dry, no rashes   Neuro: Alert and oriented to person, place, time, and situation, CNII-XII grossly intact, no focal deficits  Psych: Normal mood and affect   ASSESSMENT:   Danielle [name] is a 87 y.o. female who presents for the following: 1. Persistent atrial fibrillation ([place])   2. Acquired thrombophilia ([place])     PLAN:   1. Persistent atrial fibrillation ([place]) 2. Acquired thrombophilia ([place]) -History of atrial fibrillation.  Found incidentally.  EF is normal.  No symptoms of chest pain.  We have settled on rate control strategy as A-fib is really not bother her.  She has symptoms of fatigue but this is related to B12 deficiency.  She also has chronic iron deficiency anemia but is not bleeding.  She  will continue Xarelto 20 mg daily for stroke prophylaxis.  She is on metoprolol succinate 50 mg daily.  She is on diltiazem release 120 mg daily.  Her rates are well controlled.  Her BP is slightly elevated.  She will keep a close eye on this at home.  Would like her to start checking this every other day.  Values appear to be controlled.  She also was on Lipitor.  Most recent lipid profile is acceptable.  She is not fasting.  Suspect her triglycerides will be better when she is fasting.  She will see me yearly.  Disposition: Return in about 1 year (around 12/06/2022).  Medication Adjustments/Labs and Tests Ordered: Current medicines are reviewed at length with the patient today.  Concerns regarding medicines are outlined above.  No orders of the defined types were placed in this encounter.  No orders of the defined types were placed in this encounter.   Patient Instructions  Medication Instructions:  The current medical regimen is effective;  continue present plan and medications.  *If you need a refill on your cardiac medications before your next appointment, please call your pharmacy*   Follow-Up: At [facility], you and your health needs are our priority.  As part of our continuing mission to provide you with exceptional heart care, we have created designated Provider Care Teams.  These Care Teams include your primary Cardiologist (physician) and Advanced Practice Providers (APPs -  Physician Assistants and Nurse Practitioners) who all work together to provide you with the care you need, when you need it.  We recommend signing up for the patient portal called "MyChart".  Sign up information is provided on this After Visit Summary.  MyChart is used to connect with patients for Virtual Visits (Telemedicine).  Patients are able to view lab/test results, encounter notes, upcoming appointments, etc.  Non-urgent messages can be sent to your provider as well.   To learn more about what you can  do with MyChart, go to [email or web address].  Your next appointment:   12 month(s)  The format for your next appointment:   In Person  Provider:   Eleonore Chiquito, MD    Other Instructions Take BP every other day.     Time Spent with Patient: I have spent a total of 25 minutes with patient reviewing hospital notes, telemetry, EKGs, labs and examining the patient as well as establishing an assessment and plan that was discussed with the patient.  > 50% of time was spent in direct patient care.  Signed, Addison Naegeli. Audie Box, MD, [place]  [address], [place], [place] [address] [phone]  12/06/2021 4:44 PM

## 2021-12-06 ENCOUNTER — Ambulatory Visit (INDEPENDENT_AMBULATORY_CARE_PROVIDER_SITE_OTHER): Payer: Medicare Other | Admitting: Cardiovascular Disease

## 2021-12-06 ENCOUNTER — Encounter: Payer: Self-pay | Admitting: Cardiovascular Disease

## 2021-12-06 ENCOUNTER — Other Ambulatory Visit: Payer: Self-pay

## 2021-12-06 VITALS — BP 152/82 | HR 98 | Ht 63.0 in | Wt 166.2 lb

## 2021-12-06 DIAGNOSIS — D6869 Other thrombophilia: Secondary | ICD-10-CM

## 2021-12-06 DIAGNOSIS — I4819 Other persistent atrial fibrillation: Secondary | ICD-10-CM | POA: Diagnosis not present

## 2021-12-06 NOTE — Patient Instructions (Signed)
Medication Instructions:  The current medical regimen is effective;  continue present plan and medications.  *If you need a refill on your cardiac medications before your next appointment, please call your pharmacy*   Follow-Up: At [facility], you and your health needs are our priority.  As part of our continuing mission to provide you with exceptional heart care, we have created designated Provider Care Teams.  These Care Teams include your primary Cardiologist (physician) and Advanced Practice Providers (APPs -  Physician Assistants and Nurse Practitioners) who all work together to provide you with the care you need, when you need it.  We recommend signing up for the patient portal called "MyChart".  Sign up information is provided on this After Visit Summary.  MyChart is used to connect with patients for Virtual Visits (Telemedicine).  Patients are able to view lab/test results, encounter notes, upcoming appointments, etc.  Non-urgent messages can be sent to your provider as well.   To learn more about what you can do with MyChart, go to [email or web address].    Your next appointment:   12 month(s)  The format for your next appointment:   In Person  Provider:   Eleonore Chiquito, MD    Other Instructions Take BP every other day.

## 2021-12-24 ENCOUNTER — Other Ambulatory Visit: Payer: Self-pay | Admitting: Internal Medicine

## 2021-12-24 MED ORDER — GABAPENTIN 300 MG PO CAPS: 900.0000 mg | ORAL_CAPSULE | Freq: Every day | ORAL | 0 refills | Status: DC

## 2021-12-24 NOTE — Telephone Encounter (Signed)
Medication: gabapentin (NEURONTIN) 300 MG capsule [[id number]]  ? ?Has the patient contacted their pharmacy? YES advised to contact the office ? ?(Agent: If no, request that the patient contact the pharmacy for the refill. If patient does not wish to contact the pharmacy document the reason why and proceed with request.) ?(Agent: If yes, when and what did the pharmacy advise?) ? ?Preferred Pharmacy (with phone number or street name): [place], [place] - [place] ?[address] [place] [address] ?Phone: [phone] Fax: [phone] ?Hours: Not open 24 hours ? ? ? ?Has the patient been seen for an appointment in the last year OR does the patient have an upcoming appointment? YES 11/26/21- New Patient Aptointment ? ?Agent: Please be advised that RX refills may take up to 3 business days. We ask that you follow-up with your pharmacy. ?

## 2021-12-24 NOTE — Telephone Encounter (Signed)
Requested medication (s) are due for refill today: yes ? ?Requested medication (s) are on the active medication list: yes ? ?Last refill:  not listed ? ?Future visit scheduled: yes ? ?Notes to clinic:  historical med. Please advise ? ? ?  ?Requested Prescriptions  ?Pending Prescriptions Disp Refills  ? gabapentin (NEURONTIN) 300 MG capsule    ?  Sig: Take 3 capsules (900 mg total) by mouth at bedtime.  ?  ? Neurology: Anticonvulsants - gabapentin Passed - 12/24/2021 11:55 AM  ?  ?  Passed - Cr in normal range and within 360 days  ?  Creat  ?Date Value Ref Range Status  ?11/26/2021 0.77 0.60 - 0.95 mg/dL Final  ? ?Creatinine, Urine  ?Date Value Ref Range Status  ?11/26/2021 104 20 - 275 mg/dL Final  ?  ?  ?  ?  Passed - Completed PHQ-2 or PHQ-9 in the last 360 days  ?  ?  Passed - Valid encounter within last 12 months  ?  Recent Outpatient Visits   ? ?      ? 4 weeks ago Hypertension, unspecified type  ? [facility] [name], DO  ? ?  ?  ?Future Appointments   ? ?        ? In 2 months [name], [facility], [place]  ? ?  ? ?  ?  ?  ? ?

## 2021-12-29 ENCOUNTER — Inpatient Hospital Stay: Payer: Medicare Other | Attending: Internal Medicine

## 2021-12-29 ENCOUNTER — Other Ambulatory Visit: Payer: Self-pay

## 2021-12-29 DIAGNOSIS — D500 Iron deficiency anemia secondary to blood loss (chronic): Secondary | ICD-10-CM

## 2021-12-29 DIAGNOSIS — E538 Deficiency of other specified B group vitamins: Secondary | ICD-10-CM | POA: Insufficient documentation

## 2021-12-29 DIAGNOSIS — D531 Other megaloblastic anemias, not elsewhere classified: Secondary | ICD-10-CM

## 2021-12-29 MED ORDER — CYANOCOBALAMIN 1000 MCG/ML IJ SOLN
1000.0000 ug | INTRAMUSCULAR | Status: DC
  Administered 2021-12-29: 1000 ug via INTRAMUSCULAR
  Filled 2021-12-29: qty 1

## 2022-01-04 ENCOUNTER — Ambulatory Visit: Payer: Medicare Other

## 2022-01-04 ENCOUNTER — Ambulatory Visit: Payer: Self-pay | Admitting: *Deleted

## 2022-01-04 ENCOUNTER — Encounter: Payer: Self-pay | Admitting: Internal Medicine

## 2022-01-04 ENCOUNTER — Ambulatory Visit (INDEPENDENT_AMBULATORY_CARE_PROVIDER_SITE_OTHER): Payer: Medicare Other | Admitting: Internal Medicine

## 2022-01-04 ENCOUNTER — Ambulatory Visit: Payer: Self-pay

## 2022-01-04 VITALS — BP 136/68 | HR 110 | Temp 97.7°F | Resp 16 | Ht 64.0 in | Wt 171.4 lb

## 2022-01-04 DIAGNOSIS — M109 Gout, unspecified: Secondary | ICD-10-CM

## 2022-01-04 MED ORDER — COLCHICINE 0.6 MG PO TABS: 0.6000 mg | ORAL_TABLET | Freq: Every day | ORAL | 0 refills | Status: DC

## 2022-01-04 NOTE — Telephone Encounter (Signed)
?  Chief Complaint: Gout flare ?Symptoms: Right foot swollen right great toe swollen red painful and warm ?Frequency: past few days ?Pertinent Negatives: Patient denies fever, SOB ?Disposition: '[]'$ ED /'[]'$ Urgent Care (no appt availability in office) / '[x]'$ Appointment(In office/virtual)/ '[]'$  [place] [facility]/ '[]'$ Home Care/ '[]'$ Refused Recommended Disposition /'[]'$ [place] [facility]/ '[]'$  Follow-up with PCP ?Additional Notes: Pt is having a gout flare that is not resolving with medications that she currently has.  ? ? ? ? ? ?Reason for Disposition ? [1] SEVERE pain (e.g., excruciating, unable to do any normal activities) AND [2] not improved after 2 hours of pain medicine ? ?Answer Assessment - Initial Assessment Questions ?1. ONSET: "When did the pain start?"  ?    yesterday ?2. LOCATION: "Where is the pain located?"  ?    Right foot big toe ?3. PAIN: "How bad is the pain?"    (Scale 1-10; or mild, moderate, severe) ? - MILD (1-3): doesn't interfere with normal activities.  ? - MODERATE (4-7): interferes with normal activities (e.g., work or school) or awakens from sleep, limping.  ? - SEVERE (8-10): excruciating pain, unable to do any normal activities, unable to walk.  ?    8 ?4. WORK OR EXERCISE: "Has there been any recent work or exercise that involved this part of the body?"  ?    no ?5. CAUSE: "What do you think is causing the foot pain?" ?    Gout ?6. OTHER SYMPTOMS: "Do you have any other symptoms?" (e.g., leg pain, rash, fever, numbness) ?    Red warm ?7. PREGNANCY: "Is there any chance you are pregnant?" "When was your last menstrual period?" ?    na ? ?Protocols used: Foot Pain-A-AH ? ?

## 2022-01-04 NOTE — Telephone Encounter (Signed)
?  Chief Complaint: Right foot pain, red and swollen ?Symptoms: red and swollen big toe joint ?Frequency: saturday ?Pertinent Negatives: Patient denies  ?Disposition: '[]'$ ED /'[]'$ Urgent Care (no appt availability in office) / '[x]'$ Appointment(In office/virtual)/ '[]'$  [place] [facility]/ '[]'$ Home Care/ '[]'$ Refused Recommended Disposition /'[]'$ [place] [facility]/ '[]'$  Follow-up with PCP ?Additional Notes: Appt made prior to triage , this afternoon ?Reason for Disposition ? [1] SEVERE pain (e.g., excruciating, unable to do any normal activities) AND [2] not improved after 2 hours of pain medicine ? ?Answer Assessment - Initial Assessment Questions ?1. ONSET: "When did the pain start?"  ?    Saturday night ?2. LOCATION: "Where is the pain located?"  ?    Rt foot, big toe joint ?3. PAIN: "How bad is the pain?"    (Scale 1-10; or mild, moderate, severe) ? - MILD (1-3): doesn't interfere with normal activities.  ? - MODERATE (4-7): interferes with normal activities (e.g., work or school) or awakens from sleep, limping.  ? - SEVERE (8-10): excruciating pain, unable to do any normal activities, unable to walk.  ?    Moderate-severe ?4. WORK OR EXERCISE: "Has there been any recent work or exercise that involved this part of the body?"  ?    no ?5. CAUSE: "What do you think is causing the foot pain?" ?   Gout ?   6. OTHER SYMPTOMS: "Do you have any other symptoms?" (e.g., leg pain, rash, fever, numbness) ?    No. Foot red and swollen ? ?Protocols used: Foot Pain-A-AH ? ?

## 2022-01-04 NOTE — Telephone Encounter (Signed)
Pt called saying she is having gout rt foot, swollen and red.  She wants to know if there is anything she get take to help the pain.  ? ?CB#  [phone]  ?

## 2022-01-04 NOTE — Progress Notes (Signed)
? ?Acute Office Visit ? ?Subjective:  ? ? Patient ID: Danielle [name], female    DOB: [birth year], 87 y.o.   MRN: [id number] ? ?Chief Complaint  ?Patient presents with  ? Gout  ?  Right foot ?  ? ? ?HPI ?Patient is in today for gout flare.  ? ?GOUT ?Duration: symptoms started 3 days ?Right 1st metatarsophalangeal pain: yes ?Left 1st metatarsophalangeal pain: no ?Severity: severe  ?Quality: sharp, dull, sore, and throbbing ?Swelling: yes ?Redness: yes ?Trauma: no ?Recent dietary change or indiscretion: yes ?Fevers: no ?Nausea/vomiting: no ?Aggravating factors: walking ?Alleviating factors: nothing ?Status:  worse ?Treatments attempted: Tylenol but did not help ?Cannot take NSAIDs while on Xarelto. Diabetic with A1c 7.3 last month. Has had 3 gout flares in her life, the last about a year ago. Ate shrimp on 3 days prior to symptoms starting. Shrimp caused her last flare as well.  ? ? ?Past Medical History:  ?Diagnosis Date  ? Anemia   ? Anxiety   ? Arrhythmia   ? Arthritis   ? Atrial fibrillation ([place])   ? Cataract   ? bilateral  ? Colon polyps   ? Diabetes ([place])   ? Diabetes mellitus without complication ([place])   ? type two  ? GERD (gastroesophageal reflux disease)   ? Hypercholesteremia   ? Hypertension   ? Iron deficiency anemia   ? Pancreatitis   ? Peripheral neuropathy   ? Squamous cell skin cancer   ? ? ?Past Surgical History:  ?Procedure Laterality Date  ? APPENDECTOMY  1957  ? BREAST EXCISIONAL BIOPSY Right 1966  ? CATARACT EXTRACTION, BILATERAL    ? CHOLECYSTECTOMY  10/2008  ? lump removed right breast  1958  ? bENIGN  ? right ankle plate  4196  ? VAGINAL HYSTERECTOMY  1978  ? ? ?Family History  ?Problem Relation Age of Onset  ? Hypertension Mother   ? Stroke Mother   ? Diabetes Mother   ? Colon cancer Mother 40  ? Cerebral aneurysm Father   ? Colon cancer Sister   ?     in her 50s  ? CAD Sister   ? Heart attack Sister   ? Uterine cancer Sister   ?     in her late 50s  ? Colon cancer Sister   ?     in her 28s  ?  Parkinsonism Sister   ? Dementia Sister   ? Heart attack Brother   ? Heart attack Brother   ? Throat cancer Brother   ? Atrial fibrillation Brother   ? ? ?Social History  ? ?Socioeconomic History  ? Marital status: Widowed  ?  Spouse name: Not on file  ? Number of children: 3  ? Years of education: Not on file  ? Highest education level: Not on file  ?Occupational History  ? Not on file  ?Tobacco Use  ? Smoking status: Never  ? Smokeless tobacco: Never  ?Vaping Use  ? Vaping Use: Never used  ?Substance and Sexual Activity  ? Alcohol use: No  ?  Alcohol/week: 0.0 standard drinks  ? Drug use: No  ? Sexual activity: Not Currently  ?Other Topics Concern  ? Not on file  ?Social History Narrative  ? Not on file  ? ?Social Determinants of Health  ? ?Financial Resource Strain: Not on file  ?Food Insecurity: Not on file  ?Transportation Needs: Not on file  ?Physical Activity: Not on file  ?Stress: Not on file  ?Social Connections: Not  on file  ?Intimate Partner Violence: Not on file  ? ? ?Outpatient Medications Prior to Visit  ?Medication Sig Dispense Refill  ? atorvastatin (LIPITOR) 10 MG tablet Take 1 tablet (10 mg total) by mouth daily. 90 tablet 3  ? calcium carbonate (OSCAL) 1500 (600 Ca) MG TABS tablet Take 600 mg of elemental calcium by mouth 2 (two) times daily with a meal.    ? calcium carbonate (TUMS - DOSED IN MG ELEMENTAL CALCIUM) 500 MG chewable tablet Chew 2 tablets by mouth as needed for indigestion or heartburn.    ? Cholecalciferol (VITAMIN D3) 1000 UNITS CAPS Take 1,000 Units by mouth in the morning and at bedtime.    ? cyanocobalamin (,VITAMIN B-12,) 1000 MCG/ML injection Inject into the muscle.    ? diltiazem (DILACOR XR) 120 MG 24 hr capsule Take 120 mg by mouth daily.    ? gabapentin (NEURONTIN) 300 MG capsule Take 3 capsules (900 mg total) by mouth at bedtime. 90 capsule 0  ? Glucosamine-Chondroit-Vit C-Mn (GLUCOSAMINE CHONDR 1500 COMPLX PO) Take 2 capsules by mouth in the morning and at bedtime.    ?  insulin glargine (LANTUS) 100 UNIT/ML injection Inject 24-26 Units into the skin at bedtime.    ? metFORMIN (GLUCOPHAGE) 1000 MG tablet Take 1,000 mg by mouth 2 (two) times daily.    ? metoprolol succinate (TOPROL-XL) 50 MG 24 hr tablet Take 1 tablet by mouth in the morning and at bedtime.    ? omeprazole (PRILOSEC) 40 MG capsule Take 1 capsule by mouth daily.    ? XARELTO 20 MG TABS tablet TAKE 1 TABLET BY MOUTH ONCE DAILY WITH SUPPER 90 tablet 1  ? ?No facility-administered medications prior to visit.  ? ? ?Allergies  ?Allergen Reactions  ? Amoxicillin Swelling and Rash  ?  Throat swells  ? Hydrochlorothiazide Other (See Comments)  ?  HA and Leg cramps  ? Cleocin [Clindamycin Hcl] Rash  ? Codeine Rash  ?  And edema  ? Penicillins Rash  ? ? ?Review of Systems  ?Constitutional:  Negative for chills and fever.  ?Musculoskeletal:  Positive for joint swelling.  ?Skin:  Positive for color change.  ? ?   ?Objective:  ?  ?Physical Exam ?Constitutional:   ?   Appearance: Normal appearance.  ?HENT:  ?   Head: Normocephalic and atraumatic.  ?Eyes:  ?   Conjunctiva/sclera: Conjunctivae normal.  ?Cardiovascular:  ?   Rate and Rhythm: Normal rate and regular rhythm.  ?Pulmonary:  ?   Effort: Pulmonary effort is normal.  ?   Breath sounds: Normal breath sounds.  ?Musculoskeletal:  ?   Right lower leg: No edema.  ?   Left lower leg: No edema.  ?Skin: ?   General: Skin is warm and dry.  ?   Comments: Inflamed first MTP on right foot, warm to touch  ?Neurological:  ?   General: No focal deficit present.  ?   Mental Status: She is alert. Mental status is at baseline.  ?Psychiatric:     ?   Mood and Affect: Mood normal.     ?   Behavior: Behavior normal.  ? ? ?BP 136/68   Pulse (!) 110   Temp 97.7 ?F (36.5 ?C)   Resp 16   Ht '5\' 4"'  (1.626 m)   Wt 171 lb 6.4 oz (77.7 kg)   SpO2 96%   BMI 29.42 kg/m?  ?Wt Readings from Last 3 Encounters:  ?12/06/21 166 lb 3.2 oz (75.4 kg)  ?  11/26/21 166 lb 4.8 oz (75.4 kg)  ?09/08/21 163 lb 14.4  oz (74.3 kg)  ? ? ?Health Maintenance Due  ?Topic Date Due  ? FOOT EXAM  Never done  ? OPHTHALMOLOGY EXAM  Never done  ? COVID-19 Vaccine (3 - Booster for Pfizer series) 04/10/2020  ? ? ?There are no preventive care reminders to display for this patient. ? ? ?Lab Results  ?Component Value Date  ? TSH 2.070 02/26/2021  ? ?Lab Results  ?Component Value Date  ? WBC 8.2 11/26/2021  ? HGB 13.8 11/26/2021  ? HCT 41.5 11/26/2021  ? MCV 96.3 11/26/2021  ? PLT 253 11/26/2021  ? ?Lab Results  ?Component Value Date  ? NA 141 11/26/2021  ? K 4.3 11/26/2021  ? CO2 31 11/26/2021  ? GLUCOSE 109 (H) 11/26/2021  ? BUN 11 11/26/2021  ? CREATININE 0.77 11/26/2021  ? BILITOT 0.7 11/26/2021  ? ALKPHOS 66 04/27/2021  ? AST 14 11/26/2021  ? ALT 16 11/26/2021  ? PROT 6.9 11/26/2021  ? ALBUMIN 3.4 (L) 04/27/2021  ? CALCIUM 9.4 11/26/2021  ? ANIONGAP 10 04/27/2021  ? EGFR 76 11/26/2021  ? ?Lab Results  ?Component Value Date  ? CHOL 108 11/26/2021  ? ?Lab Results  ?Component Value Date  ? HDL 44 (L) 11/26/2021  ? ?Lab Results  ?Component Value Date  ? LDLCALC 37 11/26/2021  ? ?Lab Results  ?Component Value Date  ? TRIG 203 (H) 11/26/2021  ? ?Lab Results  ?Component Value Date  ? CHOLHDL 2.5 11/26/2021  ? ?Lab Results  ?Component Value Date  ? HGBA1C 7.3 (H) 11/26/2021  ? ? ?   ?Assessment & Plan:  ? ?1. Acute gout involving toe of right foot, unspecified cause: Cannot take NSAIDs on Xarelto, diabetic. Will treat with Colchicine. Given a list of foods to avoid that can trigger flares. Follow up over the phone on Friday, if not controlling symptoms will have to do steroid.  ? ?- colchicine 0.6 MG tablet; Take 1 tablet (0.6 mg total) by mouth daily.  Dispense: 20 tablet; Refill: 0 ? ? ?[name], DO ? ?

## 2022-01-04 NOTE — Patient Instructions (Addendum)
It was great seeing you today! ? ?Plan discussed at today's visit: ?-Take double dose initially (1.2 mg), then can take a second dose within 24 hours. Do NOT take more than 1.8 mg in 24 hours. Then every day you have symptoms, take 1 dose in the morning, can take a second dose later in the day if symptoms are still bothering you.  ?-Call me Friday if symptoms are still present or worsening.  ? ?Follow up in: as needed  ? ?Take care and let us know if you have any questions or concerns prior to your next visit. ? ?Dr. Rosana Berger ? ?Gout ?Gout is a condition that causes painful swelling of the joints. Gout is a type of inflammation of the joints (arthritis). This condition is caused by having too much uric acid in the body. Uric acid is a chemical that forms when the body breaks down substances called purines. Purines are important for building body proteins. ?When the body has too much uric acid, sharp crystals can form and build up inside the joints. This causes pain and swelling. Gout attacks can happen quickly and may be very painful (acute gout). Over time, the attacks can affect more joints and become more frequent (chronic gout). Gout can also cause uric acid to build up under the skin and inside the kidneys. ?What are the causes? ?This condition is caused by too much uric acid in your blood. This can happen because: ?Your kidneys do not remove enough uric acid from your blood. This is the most common cause. ?Your body makes too much uric acid. This can happen with some cancers and cancer treatments. It can also occur if your body is breaking down too many red blood cells (hemolytic anemia). ?You eat too many foods that are high in purines. These foods include organ meats and some seafood. Alcohol, especially beer, is also high in purines. ?A gout attack may be triggered by trauma or stress. ?What increases the risk? ?You are more likely to develop this condition if you: ?Have a family history of gout. ?Are female  and middle-aged. ?Are female and have gone through menopause. ?Are obese. ?Frequently drink alcohol, especially beer. ?Are dehydrated. ?Lose weight too quickly. ?Have an organ transplant. ?Have lead poisoning. ?Take certain medicines, including aspirin, cyclosporine, diuretics, levodopa, and niacin. ?Have kidney disease. ?Have a skin condition called psoriasis. ?What are the signs or symptoms? ?An attack of acute gout happens quickly. It usually occurs in just one joint. The most common place is the big toe. Attacks often start at night. Other joints that may be affected include joints of the feet, ankle, knee, fingers, wrist, or elbow. Symptoms of this condition may include: ?Severe pain. ?Warmth. ?Swelling. ?Stiffness. ?Tenderness. The affected joint may be very painful to touch. ?Shiny, red, or purple skin. ?Chills and fever. ?Chronic gout may cause symptoms more frequently. More joints may be involved. You may also have white or yellow lumps (tophi) on your hands or feet or in other areas near your joints. ?How is this diagnosed? ?This condition is diagnosed based on your symptoms, medical history, and physical exam. You may have tests, such as: ?Blood tests to measure uric acid levels. ?Removal of joint fluid with a thin needle (aspiration) to look for uric acid crystals. ?X-rays to look for joint damage. ?How is this treated? ?Treatment for this condition has two phases: treating an acute attack and preventing future attacks. Acute gout treatment may include medicines to reduce pain and swelling, including: ?NSAIDs. ?  Steroids. These are strong anti-inflammatory medicines that can be taken by mouth (orally) or injected into a joint. ?Colchicine. This medicine relieves pain and swelling when it is taken soon after an attack. It can be given by mouth or through an IV. ?Preventive treatment may include: ?Daily use of smaller doses of NSAIDs or colchicine. ?Use of a medicine that reduces uric acid levels in your  blood. ?Changes to your diet. You may need to see a dietitian about what to eat and drink to prevent gout. ?Follow these instructions at home: ?During a gout attack ?If directed, put ice on the affected area: ?Put ice in a plastic bag. ?Place a towel between your skin and the bag. ?Leave the ice on for 20 minutes, 2-3 times a day. ?Raise (elevate) the affected joint above the level of your heart as often as possible. ?Rest the joint as much as possible. If the affected joint is in your leg, you may be given crutches to use. ?Follow instructions from your health care provider about eating or drinking restrictions. ?Avoiding future gout attacks ?Follow a low-purine diet as told by your dietitian or health care provider. Avoid foods and drinks that are high in purines, including liver, kidney, anchovies, asparagus, herring, mushrooms, mussels, and beer. ?Maintain a healthy weight or lose weight if you are overweight. If you want to lose weight, talk with your health care provider. It is important that you do not lose weight too quickly. ?Start or maintain an exercise program as told by your health care provider. ?Eating and drinking ?Drink enough fluids to keep your urine pale yellow. ?If you drink alcohol: ?Limit how much you use to: ?0-1 drink a day for women. ?0-2 drinks a day for men. ?Be aware of how much alcohol is in your drink. In the [place] one drink equals one 12 oz bottle of beer (355 mL) one 5 oz glass of wine (148 mL), or one 1? oz glass of hard liquor (44 mL). ?General instructions ?Take over-the-counter and prescription medicines only as told by your health care provider. ?Do not drive or use heavy machinery while taking prescription pain medicine. ?Return to your normal activities as told by your health care provider. Ask your health care provider what activities are safe for you. ?Keep all follow-up visits as told by your health care provider. This is important. ?Contact a health care provider if you  have: ?Another gout attack. ?Continuing symptoms of a gout attack after 10 days of treatment. ?Side effects from your medicines. ?Chills or a fever. ?Burning pain when you urinate. ?Pain in your lower back or belly. ?Get help right away if you: ?Have severe or uncontrolled pain. ?Cannot urinate. ?Summary ?Gout is painful swelling of the joints caused by inflammation. ?The most common site of pain is the big toe, but it can affect other joints in the body. ?Medicines and dietary changes can help to prevent and treat gout attacks. ?This information is not intended to replace advice given to you by your health care provider. Make sure you discuss any questions you have with your health care provider. ?Document Revised: 04/20/2018 Document Reviewed: 05/02/2018 ?Elsevier Patient Education ? [place]. ? ?Low-Purine Eating Plan ?A low-purine eating plan involves making food choices to limit your intake of purine. Purine is a kind of uric acid. Too much uric acid in your blood can cause certain conditions, such as gout and kidney stones. Eating a low-purine diet can help control these conditions. ?What are tips for  following this plan? ?Reading food labels ?Avoid foods with saturated or Trans fat. ?Check the ingredient list of grains-based foods, such as bread and cereal, to make sure that they contain whole grains. ?Check the ingredient list of sauces or soups to make sure they do not contain meat or fish. ?When choosing soft drinks, check the ingredient list to make sure they do not contain high-fructose corn syrup. ?Shopping ?Buy plenty of fresh fruits and vegetables. ?Avoid buying canned or fresh fish. ?Buy dairy products labeled as low-fat or nonfat. ?Avoid buying premade or processed foods. These foods are often high in fat, salt (sodium), and added sugar. ?Cooking ?Use olive oil instead of butter when cooking. Oils like olive oil, canola oil, and sunflower oil contain healthy fats. ?Meal planning ?Learn  which foods do or do not affect you. If you find out that a food tends to cause your gout symptoms to flare up, avoid eating that food. You can enjoy foods that do not cause problems. If you have any questions

## 2022-01-07 ENCOUNTER — Encounter: Payer: Self-pay | Admitting: Internal Medicine

## 2022-01-07 ENCOUNTER — Telehealth (INDEPENDENT_AMBULATORY_CARE_PROVIDER_SITE_OTHER): Payer: Medicare Other | Admitting: Internal Medicine

## 2022-01-07 DIAGNOSIS — M109 Gout, unspecified: Secondary | ICD-10-CM

## 2022-01-07 MED ORDER — METHYLPREDNISOLONE 4 MG PO TBPK: ORAL_TABLET | ORAL | 0 refills | Status: DC

## 2022-01-07 NOTE — Progress Notes (Signed)
Virtual Visit via Telephone Note ? ?I connected with Danielle [name] on 01/07/22 at  9:40 AM EDT by telephone and verified that I am speaking with the correct person using two identifiers. ? ?Location: ?Patient: Home ?Provider: [facility] ?  ?I discussed the limitations, risks, security and privacy concerns of performing an evaluation and management service by telephone and the availability of in person appointments. I also discussed with the patient that there may be a patient responsible charge related to this service. The patient expressed understanding and agreed to proceed. ? ? ?History of Present Illness: ? ?Danielle [name] is a 87 year old female presenting over the phone for recheck on gout from 3 days ago. She was started on Colchicine, which she has been compliant with. The next after starting the medication, her foot, ankle and leg was very swollen and red but then yesterday and today it was doing much better and is no longer painful. The swelling has reduced and is almost back to normal. No other skin changes, no fevers. Is planning a trip to [place] on Sunday to see her sister.  ?  ?Observations/Objective: ? ?General: no acute distress ?Neuro: answers all questions appropriately  ? ?Assessment and Plan: ? ?1. Acute gout involving toe of right foot, unspecified cause: Appears to be resolving, continue Colchicine. Will send a steroid pack to take with her on her trip in case symptoms return. Discussed how this will make her sugars higher if she takes it. Patient voices understanding.  ? ?- methylPREDNISolone (MEDROL DOSEPAK) 4 MG TBPK tablet; Day 1: Take 8 mg (2 tablets) before breakfast, 4 mg (1 tablet) after lunch, 4 mg (1 tablet) after supper, and 8 mg (2 tablets) at bedtime. Day 2:Take 4 mg (1 tablet) before breakfast, 4 mg (1 tablet) after lunch, 4 mg (1 tablet) after supper, and 8 mg (2 tablets) at bedtime. Day 3: Take 4 mg (1 tablet) before breakfast, 4 mg (1 tablet) after lunch, 4 mg (1 tablet) after  supper, and 4 mg (1 tablet) at bedtime. Day 4: Take 4 mg (1 tablet) before breakfast, 4 mg (1 tablet) after lunch, and 4 mg (1 tablet) at bedtime. Day 5: Take 4 mg (1 tablet) before breakfast and 4 mg (1 tablet) at bedtime. Day 6: Take 4 mg (1 tablet) before breakfast.  Dispense: 1 each; Refill: 0 ? ? ?Follow Up Instructions: PRN ? ?  ?I discussed the assessment and treatment plan with the patient. The patient was provided an opportunity to ask questions and all were answered. The patient agreed with the plan and demonstrated an understanding of the instructions. ?  ?The patient was advised to call back or seek an in-person evaluation if the symptoms worsen or if the condition fails to improve as anticipated. ? ?I provided 11 minutes of non-face-to-face time during this encounter. ? ? ?[name], DO ? ?

## 2022-01-17 DIAGNOSIS — L851 Acquired keratosis [keratoderma] palmaris et plantaris: Secondary | ICD-10-CM | POA: Diagnosis not present

## 2022-01-17 DIAGNOSIS — E1140 Type 2 diabetes mellitus with diabetic neuropathy, unspecified: Secondary | ICD-10-CM | POA: Diagnosis not present

## 2022-01-17 DIAGNOSIS — Z794 Long term (current) use of insulin: Secondary | ICD-10-CM | POA: Diagnosis not present

## 2022-01-17 DIAGNOSIS — B351 Tinea unguium: Secondary | ICD-10-CM | POA: Diagnosis not present

## 2022-01-18 ENCOUNTER — Ambulatory Visit
Admission: RE | Admit: 2022-01-18 | Discharge: 2022-01-18 | Disposition: A | Discharge status: Home / Self Care | Payer: Medicare Other | Source: Ambulatory Visit | Attending: Internal Medicine | Admitting: Internal Medicine

## 2022-01-18 DIAGNOSIS — Z1231 Encounter for screening mammogram for malignant neoplasm of breast: Secondary | ICD-10-CM

## 2022-01-25 ENCOUNTER — Telehealth: Payer: Self-pay | Admitting: Internal Medicine

## 2022-01-25 DIAGNOSIS — H903 Sensorineural hearing loss, bilateral: Secondary | ICD-10-CM | POA: Diagnosis not present

## 2022-01-25 DIAGNOSIS — H9313 Tinnitus, bilateral: Secondary | ICD-10-CM | POA: Diagnosis not present

## 2022-01-25 NOTE — Telephone Encounter (Signed)
Called patient regarding upcoming appointments, patient is notified. 

## 2022-01-26 ENCOUNTER — Inpatient Hospital Stay: Payer: Medicare Other | Attending: Internal Medicine

## 2022-01-26 ENCOUNTER — Other Ambulatory Visit: Payer: Self-pay

## 2022-01-26 ENCOUNTER — Other Ambulatory Visit: Payer: Self-pay | Admitting: Physician Assistant

## 2022-01-26 DIAGNOSIS — D531 Other megaloblastic anemias, not elsewhere classified: Secondary | ICD-10-CM

## 2022-01-26 DIAGNOSIS — H903 Sensorineural hearing loss, bilateral: Secondary | ICD-10-CM

## 2022-01-26 DIAGNOSIS — D500 Iron deficiency anemia secondary to blood loss (chronic): Secondary | ICD-10-CM

## 2022-01-26 DIAGNOSIS — E538 Deficiency of other specified B group vitamins: Secondary | ICD-10-CM | POA: Insufficient documentation

## 2022-01-26 MED ORDER — CYANOCOBALAMIN 1000 MCG/ML IJ SOLN
1000.0000 ug | INTRAMUSCULAR | Status: DC
  Administered 2022-01-26: 1000 ug via INTRAMUSCULAR
  Filled 2022-01-26: qty 1

## 2022-02-02 ENCOUNTER — Other Ambulatory Visit: Payer: Self-pay | Admitting: Internal Medicine

## 2022-02-03 NOTE — Telephone Encounter (Signed)
Requested Prescriptions  ?Pending Prescriptions Disp Refills  ?? gabapentin (NEURONTIN) 300 MG capsule [Pharmacy Med Name: Gabapentin 300 MG Oral Capsule] 90 capsule 0  ?  Sig: TAKE 3 CAPSULES BY MOUTH AT BEDTIME  ?  ? Neurology: Anticonvulsants - gabapentin Passed - 02/02/2022 11:39 PM  ?  ?  Passed - Cr in normal range and within 360 days  ?  Creat  ?Date Value Ref Range Status  ?11/26/2021 0.77 0.60 - 0.95 mg/dL Final  ? ?Creatinine, Urine  ?Date Value Ref Range Status  ?11/26/2021 104 20 - 275 mg/dL Final  ?   ?  ?  Passed - Completed PHQ-2 or PHQ-9 in the last 360 days  ?  ?  Passed - Valid encounter within last 12 months  ?  Recent Outpatient Visits   ?      ? 3 weeks ago Acute gout involving toe of right foot, unspecified cause  ? [place], DO  ? 1 month ago Acute gout involving toe of right foot, unspecified cause  ? [place], DO  ? 2 months ago Hypertension, unspecified type  ? [facility] [name], DO  ?  ?  ?Future Appointments   ?        ? In 3 weeks [name], DO [facility] [place]  ?  ? ?  ?  ?  ? ?

## 2022-02-16 ENCOUNTER — Telehealth: Payer: Self-pay | Admitting: Internal Medicine

## 2022-02-16 NOTE — Telephone Encounter (Signed)
Called patient regarding upcoming May appointments, patient is notified.  ?

## 2022-02-23 ENCOUNTER — Other Ambulatory Visit: Payer: Self-pay | Admitting: Physician Assistant

## 2022-02-24 ENCOUNTER — Ambulatory Visit (INDEPENDENT_AMBULATORY_CARE_PROVIDER_SITE_OTHER): Payer: Medicare Other | Admitting: Internal Medicine

## 2022-02-24 ENCOUNTER — Ambulatory Visit: Payer: Medicare Other | Admitting: Internal Medicine

## 2022-02-24 VITALS — BP 122/68 | HR 106 | Temp 98.2°F | Resp 16 | Ht 64.0 in | Wt 170.1 lb

## 2022-02-24 DIAGNOSIS — D500 Iron deficiency anemia secondary to blood loss (chronic): Secondary | ICD-10-CM | POA: Diagnosis not present

## 2022-02-24 DIAGNOSIS — E1142 Type 2 diabetes mellitus with diabetic polyneuropathy: Secondary | ICD-10-CM | POA: Diagnosis not present

## 2022-02-24 DIAGNOSIS — H903 Sensorineural hearing loss, bilateral: Secondary | ICD-10-CM | POA: Diagnosis not present

## 2022-02-24 DIAGNOSIS — I4821 Permanent atrial fibrillation: Secondary | ICD-10-CM

## 2022-02-24 DIAGNOSIS — E782 Mixed hyperlipidemia: Secondary | ICD-10-CM | POA: Diagnosis not present

## 2022-02-24 DIAGNOSIS — Z8739 Personal history of other diseases of the musculoskeletal system and connective tissue: Secondary | ICD-10-CM | POA: Diagnosis not present

## 2022-02-24 DIAGNOSIS — R239 Unspecified skin changes: Secondary | ICD-10-CM | POA: Diagnosis not present

## 2022-02-24 DIAGNOSIS — I10 Essential (primary) hypertension: Secondary | ICD-10-CM

## 2022-02-24 DIAGNOSIS — K219 Gastro-esophageal reflux disease without esophagitis: Secondary | ICD-10-CM

## 2022-02-24 MED ORDER — ALLOPURINOL 100 MG PO TABS: 100.0000 mg | ORAL_TABLET | Freq: Every day | ORAL | 1 refills | Status: DC

## 2022-02-24 MED ORDER — OMEPRAZOLE 40 MG PO CPDR: 40.0000 mg | DELAYED_RELEASE_CAPSULE | Freq: Every day | ORAL | 0 refills | Status: DC

## 2022-02-24 MED ORDER — ZINC OXIDE 10 % EX OINT: 1.0000 | TOPICAL_OINTMENT | Freq: Every day | CUTANEOUS | 0 refills | Status: DC | PRN

## 2022-02-24 MED ORDER — COLCHICINE 0.6 MG PO TABS: 0.6000 mg | ORAL_TABLET | Freq: Every day | ORAL | 0 refills | Status: DC

## 2022-02-24 NOTE — Assessment & Plan Note (Signed)
Stable.  Continue Prilosec 40 mg daily. 

## 2022-02-24 NOTE — Assessment & Plan Note (Signed)
Stable.  Last hemoglobin normal.  She does follow with hematology and will see them next week for repeat labs. ?

## 2022-02-24 NOTE — Patient Instructions (Addendum)
It was great seeing you today! ? ?Plan discussed at today's visit: ?-Allopurinol 100 mg daily for gout prevention, list of foods to avoid below ?-Colchicine refilled to use during gout flares as needed ?-Zinc oxide cream sent to pharmacy for sore ? ?Follow up in: 6 months  ? ?Take care and let us know if you have any questions or concerns prior to your next visit. ? ?Dr. Rosana Berger ? ?Low-Purine Eating Plan ?A low-purine eating plan involves making food choices to limit your purine intake. Purine is a kind of uric acid. Too much uric acid in your blood can cause certain conditions, such as gout and kidney stones. Eating a low-purine diet may help control these conditions. ?What are tips for following this plan? ?Shopping ?Avoid buying products that contain high-fructose corn syrup. Check for this on food labels. It is commonly found in many processed foods and soft drinks. Be sure to check for it in baked goods such as cookies, canned fruits, and cereals and cereal bars. ?Avoid buying veal, chicken breast with skin, lamb, and organ meats such as liver. These types of meats tend to have the highest purine content. ?Choose dairy products. These may lower uric acid levels. ?Avoid certain types of fish. Not all fish and seafood have high purine content. Examples with high purine content include anchovies, trout, tuna, sardines, and salmon. ?Avoid buying beverages that contain alcohol, particularly beer and hard liquor. Alcohol can affect the way your body gets rid of uric acid. ?Meal planning ? ?Learn which foods do or do not affect you. If you find out that a food tends to cause your gout symptoms to flare up, avoid eating that food. You can enjoy foods that do not cause problems. If you have any questions about a food item, talk with your dietitian or health care provider. ?Reduce the overall amount of meat in your diet. When you do eat meat, choose ones with lower purine content. ?Include plenty of fruits and  vegetables. Although some vegetables may have a high purine content--such as asparagus, mushrooms, spinach, or cauliflower--it has been shown that these do not contribute to uric acid blood levels as much. ?Consume at least 1 dairy serving a day. This has been shown to decrease uric acid levels. ?General information ?If you drink alcohol: ?Limit how much you have to: ?0-1 drink a day for women who are not pregnant. ?0-2 drinks a day for men. ?Know how much alcohol is in a drink. In the [place] one drink equals one 12 oz bottle of beer (355 mL), one 5 oz glass of wine (148 mL), or one 1? oz glass of hard liquor (44 mL). ?Drink plenty of water. Try to drink enough to keep your urine pale yellow. Fluids can help remove uric acid from your body. ?Work with your health care provider and dietitian to develop a plan to achieve or maintain a healthy weight. Losing weight may help reduce uric acid in your blood. ?What foods are recommended? ?The following are some types of foods that are good choices when limiting purine intake: ?Fresh or frozen fruits and vegetables. ?Whole grains, breads, cereals, and pasta. Rice. ?Beans, peas, legumes. ?Nuts and seeds. ?Dairy products. ?Fats and oils. ?The items listed above may not be a complete list. Talk with a dietitian about what dietary choices are best for you. ?What foods are not recommended? ?Limit your intake of foods high in purines, including: ?Beer and other alcohol. ?Meat-based gravy or sauce. ?Canned or fresh fish, such  as: ?Anchovies, sardines, herring, salmon, and tuna. ?Mussels and scallops. ?Codfish, trout, and haddock. ?Bacon, veal, chicken breast with skin, and lamb. ?Organ meats, such as: ?Liver or kidney. ?Tripe. ?Sweetbreads (thymus gland or pancreas). ?[employer] or goose. ?Yeast or yeast extract supplements. ?Drinks sweetened with high-fructose corn syrup, such as soda. ?Processed foods made with high-fructose corn syrup. ?The items listed above may not be a  complete list of foods and beverages you should limit. Contact a dietitian for more information. ?Summary ?Eating a low-purine diet may help control conditions caused by too much uric acid in the body, such as gout or kidney stones. ?Choose low-purine foods, limit alcohol, and limit high-fructose corn syrup. ?You will learn over time which foods do or do not affect you. If you find out that a food tends to cause your gout symptoms to flare up, avoid eating that food. ?This information is not intended to replace advice given to you by your health care provider. Make sure you discuss any questions you have with your health care provider. ?Document Revised: 09/23/2021 Document Reviewed: 09/23/2021 ?Elsevier Patient Education ? [place]. ?

## 2022-02-24 NOTE — Assessment & Plan Note (Signed)
[facility] audiology, note reviewed from 01/25/2022.  Repeat hearing test showed asymmetric sensorineural hearing loss in the left ear, planning on obtaining a brain MRI in the next few weeks.  We will follow along with results. ?

## 2022-02-24 NOTE — Assessment & Plan Note (Addendum)
Stable.  Blood pressure at goal today.  Continue metoprolol 50 mg daily. ?

## 2022-02-24 NOTE — Progress Notes (Signed)
? ?New Patient Office Visit ? ?Subjective:  ?Patient ID: Danielle [name], female    DOB: Jun 13, 1935  Age: 87 y.o. MRN: [id number] ? ?CC:  ?Chief Complaint  ?Patient presents with  ? Follow-up  ? Hyperlipidemia  ? Diabetes  ?  Foot exam  ? Medication Management  ?  Discuss gout meds  ? Abrasion  ?  On bottom  ? ? ?HPI ?Verl Dicker Ramp here for follow up on chronic medical conditions.  ? ?Hypertension/A.Fib: ?-Medications: Diltiazem 120, Metoprolol 50, Xarelto 20 ?-Follows with Cardiology, Dr. Audie Box 12/06/21 ?-Patient is compliant with above medications and reports no side effects. ?-Checking BP at home (average): doesn't check  ?-Denies any SOB, CP, vision changes, LE edema or symptoms of hypotension. Does have fatigue.  ? ?HLD: ?-Medications: Lipitor 10 ?-Patient is compliant with above medications and reports no side effects.  ?-Last lipid panel: 10/2008 ?Lipid Panel  ?   ?Component Value Date/Time  ? CHOL 108 11/26/2021 1146  ? TRIG 203 (H) 11/26/2021 1146  ? HDL 44 (L) 11/26/2021 1146  ? CHOLHDL 2.5 11/26/2021 1146  ? VLDL 45 (H) 11/10/2008 0317  ? LDLCALC 37 11/26/2021 1146  ? ? ?Diabetes, Type 2 with Neuropathy: ?-Last A1c 7.3% 2/23 ?-Medications: Metformin 1000 BID, Lantus 24-26 units at night, Gabapentin 300 mg TID ?-Patient is compliant with the above medications and reports no side effects.  ?-Checking BG at home: fasting <100 usually, highest 102 ?-Highest home BG since last visit: 102 ?-Lowest home BG since last visit: 71 ?-Eye exam: Coming up soon  ?-Foot exam: Follows with Podiatry, Dr. Jens Som every 3 months  ?-Microalbumin: UTD 2/23 ?-Statin: yes ?-PNA vaccine: yes ?-Denies symptoms of hypoglycemia, polyuria, polydipsia, numbness extremities, foot ulcers/trauma.  ? ?Iron Deficiency Anemia/Vitamin B12 deficiency: ?-Had been on IV iron in the past, but not on any more ?-Following with Hematology next week for labs ? ?GERD: ?-Currently on Prilosec 40, doing well ? ?Sensorineural Hearing Loss in left ear due to  COVID: ?-Following with [place] [facility], note from 01/25/22 reviewed, plan on repeating hearing test and continuing hearing aids. Has an appointment on 5/16 for an MRI brain  ? ?Skin change: ?-Located on bottom of left buttock, had for years but becoming more sore and rough to the touch over the last year or so ?-Very painful for sitting for prolonged periods ?-Used topical antibiotic cream and baby powder on it before but not helping ?-No wound or drainage present ?-Does have a dermatology appointment over the summer ? ?Gout: ?-Last flare in March of this year, treated with Medrol Dosepak and Colchicine, not on Allopurinol currently ?-Has had a total of 3 flares but last time was the worse, located in the great toe ? ?Health maintenance: ?-Blood work UTD ?-Mammogram 3/23, Birads-1 ?-Colonoscopy 6/22, repeat every 5 years ? ? ?Past Medical History:  ?Diagnosis Date  ? Anemia   ? Anxiety   ? Arrhythmia   ? Arthritis   ? Atrial fibrillation ([place])   ? Cataract   ? bilateral  ? Colon polyps   ? Diabetes ([place])   ? Diabetes mellitus without complication ([place])   ? type two  ? GERD (gastroesophageal reflux disease)   ? Hypercholesteremia   ? Hypertension   ? Iron deficiency anemia   ? Pancreatitis   ? Peripheral neuropathy   ? Squamous cell skin cancer   ? ? ?Past Surgical History:  ?Procedure Laterality Date  ? APPENDECTOMY  1957  ? BREAST  EXCISIONAL BIOPSY Right 1966  ? CATARACT EXTRACTION, BILATERAL    ? CHOLECYSTECTOMY  10/2008  ? lump removed right breast  1958  ? bENIGN  ? right ankle plate  [id number]  ? VAGINAL HYSTERECTOMY  1978  ? ? ?Family History  ?Problem Relation Age of Onset  ? Hypertension Mother   ? Stroke Mother   ? Diabetes Mother   ? Colon cancer Mother 4  ? Cerebral aneurysm Father   ? Colon cancer Sister   ?     in her 81s  ? CAD Sister   ? Heart attack Sister   ? Uterine cancer Sister   ?     in her late 27s  ? Colon cancer Sister   ?     in her 39s  ? Parkinsonism Sister   ? Dementia  Sister   ? Heart attack Brother   ? Heart attack Brother   ? Throat cancer Brother   ? Atrial fibrillation Brother   ? ? ?Social History  ? ?Socioeconomic History  ? Marital status: Widowed  ?  Spouse name: Not on file  ? Number of children: 3  ? Years of education: Not on file  ? Highest education level: Not on file  ?Occupational History  ? Not on file  ?Tobacco Use  ? Smoking status: Never  ? Smokeless tobacco: Never  ?Vaping Use  ? Vaping Use: Never used  ?Substance and Sexual Activity  ? Alcohol use: No  ?  Alcohol/week: 0.0 standard drinks  ? Drug use: No  ? Sexual activity: Not Currently  ?Other Topics Concern  ? Not on file  ?Social History Narrative  ? Not on file  ? ?Social Determinants of Health  ? ?Financial Resource Strain: Not on file  ?Food Insecurity: Not on file  ?Transportation Needs: Not on file  ?Physical Activity: Not on file  ?Stress: Not on file  ?Social Connections: Not on file  ?Intimate Partner Violence: Not on file  ? ? ?ROS ?Review of Systems  ?Constitutional:  Negative for chills and fever.  ?HENT:  Positive for hearing loss.   ?Eyes:  Negative for visual disturbance.  ?Respiratory:  Negative for cough and shortness of breath.   ?Cardiovascular:  Negative for chest pain, palpitations and leg swelling.  ?Gastrointestinal:  Negative for abdominal pain, nausea and vomiting.  ?Skin:  Positive for color change.  ?Neurological:  Negative for dizziness and headaches.  ? ?Objective:  ? ?Today's Vitals: BP 122/68   Pulse (!) 106   Temp 98.2 ?F (36.8 ?C)   Resp 16   Ht '5\' 4"'$  (1.626 m)   Wt 170 lb 1.6 oz (77.2 kg)   BMI 29.20 kg/m?  ? ?Physical Exam ?Constitutional:   ?   Appearance: Normal appearance.  ?HENT:  ?   Head: Normocephalic and atraumatic.  ?   Nose: Nose normal.  ?   Mouth/Throat:  ?   Mouth: Mucous membranes are moist.  ?   Pharynx: Oropharynx is clear.  ?Eyes:  ?   Conjunctiva/sclera: Conjunctivae normal.  ?Cardiovascular:  ?   Rate and Rhythm: Normal rate and regular rhythm.   ?Pulmonary:  ?   Effort: Pulmonary effort is normal.  ?   Breath sounds: Normal breath sounds.  ?Musculoskeletal:  ?   Right lower leg: No edema.  ?   Left lower leg: No edema.  ?Skin: ?   General: Skin is warm and dry.  ?   Comments: 1 x 1 cm rough  lesion located on medial inferior aspect of left buttock.  No erythematous, drainage, wound or signs of infection present.  Not ulcerated.  ?Neurological:  ?   General: No focal deficit present.  ?   Mental Status: She is alert. Mental status is at baseline.  ?Psychiatric:     ?   Mood and Affect: Mood normal.     ?   Behavior: Behavior normal.  ? ? ?Assessment & Plan:  ? ?Problem List Items Addressed This Visit   ? ?  ? Cardiovascular and Mediastinum  ? Atrial fibrillation ([place])  ?  Stable.  Following cardiology, Dr. Davina Poke.  Note reviewed from 12/06/2021.  Continue diltiazem 120 mg daily, metoprolol 50 mg daily and Xarelto 20 mg daily.   ? ?  ?  ? Essential hypertension  ?  Stable.  Blood pressure at goal today.  Continue metoprolol 50 mg daily. ? ?  ?  ?  ? Digestive  ? GERD (gastroesophageal reflux disease)  ?  Stable.  Continue Prilosec 40 mg daily. ? ?  ?  ? Relevant Medications  ? omeprazole (PRILOSEC) 40 MG capsule  ?  ? Endocrine  ? Diabetic peripheral neuropathy associated with type 2 diabetes mellitus ([place])  ?  Chronic problem.  Last A1c in February 7 0.3%.  Continue metformin at 1000 mg twice daily, Lantus 24 to 26 units at night.  She is on gabapentin 300 mg 3 times daily for diabetic neuropathy for which she follows with Dr. Jens Som podiatry. ? ?  ?  ?  ? Nervous and Auditory  ? Asymmetric SNHL (sensorineural hearing loss)  ?  [facility], note reviewed from 01/25/2022.  Repeat hearing test showed asymmetric sensorineural hearing loss in the left ear, planning on obtaining a brain MRI in the next few weeks.  We will follow along with results. ? ?  ?  ?  ? Other  ? Iron deficiency anemia due to chronic blood loss  ?   Stable.  Last hemoglobin normal.  She does follow with hematology and will see them next week for repeat labs. ? ?  ?  ? Mixed hyperlipidemia  ?  Stable.  Reviewed last lipid panel in depth with the patient.  Cont

## 2022-02-24 NOTE — Assessment & Plan Note (Signed)
Chronic problem.  Last A1c in February 7 0.3%.  Continue metformin at 1000 mg twice daily, Lantus 24 to 26 units at night.  She is on gabapentin 300 mg 3 times daily for diabetic neuropathy for which she follows with Dr. Jens Som podiatry. ?

## 2022-02-24 NOTE — Assessment & Plan Note (Signed)
Stable.  Following cardiology, Dr. Davina Poke.  Note reviewed from 12/06/2021.  Continue diltiazem 120 mg daily, metoprolol 50 mg daily and Xarelto 20 mg daily.   ?

## 2022-02-24 NOTE — Assessment & Plan Note (Signed)
Start allopurinol 100 mg daily for prevention of gout.  Also discussed foods to avoid that can trigger future gout flares.  Colchicine was refilled for her to use as needed for flares. ?

## 2022-02-24 NOTE — Assessment & Plan Note (Signed)
Stable.  Reviewed last lipid panel in depth with the patient.  Continue Lipitor 10 mg daily. ?

## 2022-02-25 ENCOUNTER — Other Ambulatory Visit: Payer: Self-pay | Admitting: Cardiovascular Disease

## 2022-02-28 ENCOUNTER — Other Ambulatory Visit: Payer: Self-pay

## 2022-02-28 DIAGNOSIS — D500 Iron deficiency anemia secondary to blood loss (chronic): Secondary | ICD-10-CM

## 2022-03-02 ENCOUNTER — Ambulatory Visit: Payer: Medicare Other

## 2022-03-02 ENCOUNTER — Other Ambulatory Visit: Payer: Self-pay

## 2022-03-02 ENCOUNTER — Inpatient Hospital Stay (HOSPITAL_BASED_OUTPATIENT_CLINIC_OR_DEPARTMENT_OTHER): Payer: Medicare Other | Admitting: Internal Medicine

## 2022-03-02 ENCOUNTER — Ambulatory Visit: Payer: Medicare Other | Admitting: Internal Medicine

## 2022-03-02 ENCOUNTER — Inpatient Hospital Stay: Payer: Medicare Other

## 2022-03-02 ENCOUNTER — Inpatient Hospital Stay: Payer: Medicare Other | Attending: Internal Medicine

## 2022-03-02 ENCOUNTER — Other Ambulatory Visit: Payer: Medicare Other

## 2022-03-02 VITALS — BP 151/80 | HR 94 | Temp 97.6°F | Resp 18 | Wt 169.2 lb

## 2022-03-02 DIAGNOSIS — D509 Iron deficiency anemia, unspecified: Secondary | ICD-10-CM | POA: Insufficient documentation

## 2022-03-02 DIAGNOSIS — D500 Iron deficiency anemia secondary to blood loss (chronic): Secondary | ICD-10-CM

## 2022-03-02 DIAGNOSIS — E538 Deficiency of other specified B group vitamins: Secondary | ICD-10-CM | POA: Insufficient documentation

## 2022-03-02 DIAGNOSIS — D531 Other megaloblastic anemias, not elsewhere classified: Secondary | ICD-10-CM

## 2022-03-02 LAB — CBC WITH DIFFERENTIAL (CANCER CENTER ONLY)
Abs Immature Granulocytes: 0.01 10*3/uL (ref 0.00–0.07)
Basophils Absolute: 0.1 10*3/uL (ref 0.0–0.1)
Basophils Relative: 1 %
Eosinophils Absolute: 0.3 10*3/uL (ref 0.0–0.5)
Eosinophils Relative: 5 %
HCT: 40.3 % (ref 36.0–46.0)
Hemoglobin: 13.3 g/dL (ref 12.0–15.0)
Immature Granulocytes: 0 %
Lymphocytes Relative: 32 %
Lymphs Abs: 2.3 10*3/uL (ref 0.7–4.0)
MCH: 31.6 pg (ref 26.0–34.0)
MCHC: 33 g/dL (ref 30.0–36.0)
MCV: 95.7 fL (ref 80.0–100.0)
Monocytes Absolute: 0.5 10*3/uL (ref 0.1–1.0)
Monocytes Relative: 7 %
Neutro Abs: 4.1 10*3/uL (ref 1.7–7.7)
Neutrophils Relative %: 55 %
Platelet Count: 244 10*3/uL (ref 150–400)
RBC: 4.21 MIL/uL (ref 3.87–5.11)
RDW: 12.9 % (ref 11.5–15.5)
WBC Count: 7.2 10*3/uL (ref 4.0–10.5)
nRBC: 0 % (ref 0.0–0.2)

## 2022-03-02 LAB — IRON AND IRON BINDING CAPACITY (CC-WL,HP ONLY)
Iron: 88 ug/dL (ref 28–170)
Saturation Ratios: 29 % (ref 10.4–31.8)
TIBC: 307 ug/dL (ref 250–450)
UIBC: 219 ug/dL (ref 148–442)

## 2022-03-02 LAB — FOLATE: Folate: 9.1 ng/mL (ref >5.9)

## 2022-03-02 LAB — FERRITIN: Ferritin: 27 ng/mL (ref 11–307)

## 2022-03-02 LAB — VITAMIN B12: Vitamin B-12: 236 pg/mL (ref 180–914)

## 2022-03-02 NOTE — Progress Notes (Signed)
?    [facility] ?[phone]   [phone] ? ?OFFICE PROGRESS NOTE ? ?[name], DO ?[address] ?[place] [address] ? ?DIAGNOSIS:  Persistent microcytic anemia secondary to iron deficiency of unclear etiology but could be secondary to malabsorption or occult gastrointestinal bleeding.  She underwent upper endoscopy and colonoscopy that were unremarkable. ? ?PRIOR THERAPY:  ?1) Venofer 300 mg IV weekly for 3 weeks. ? ? ?CURRENT THERAPY: Vitamin B12 injection 1000 mcg intramuscular weekly for 4 weeks and then monthly. ? ?INTERVAL HISTORY: ?Danielle [name] 87 y.o. female returns to the clinic today for follow-up visit.  The patient is feeling fine today with no concerning complaints except for occasional fatigue.  She denied having any chest pain, shortness of breath, cough or hemoptysis.  She has no nausea, vomiting, diarrhea or constipation.  She has no headache or visual changes.  She has no recent weight loss or night sweats.  She has been tolerating her vitamin B12 injection fairly well.  The patient is here today for evaluation and repeat blood work. ? ? ?MEDICAL HISTORY: ?Past Medical History:  ?Diagnosis Date  ? Anemia   ? Anxiety   ? Arrhythmia   ? Arthritis   ? Atrial fibrillation ([place])   ? Cataract   ? bilateral  ? Colon polyps   ? Diabetes ([place])   ? Diabetes mellitus without complication ([place])   ? type two  ? GERD (gastroesophageal reflux disease)   ? Hypercholesteremia   ? Hypertension   ? Iron deficiency anemia   ? Pancreatitis   ? Peripheral neuropathy   ? Squamous cell skin cancer   ? ? ?ALLERGIES:  is allergic to amoxicillin, hydrochlorothiazide, cleocin [clindamycin hcl], codeine, and penicillins. ? ?MEDICATIONS:  ?Current Outpatient Medications  ?Medication Sig Dispense Refill  ? allopurinol (ZYLOPRIM) 100 MG tablet Take 1 tablet (100 mg total) by mouth daily. 90 tablet 1  ? atorvastatin (LIPITOR) 10 MG tablet Take 1 tablet (10 mg  total) by mouth daily. 90 tablet 3  ? calcium carbonate (OSCAL) 1500 (600 Ca) MG TABS tablet Take 600 mg of elemental calcium by mouth 2 (two) times daily with a meal.    ? calcium carbonate (TUMS - DOSED IN MG ELEMENTAL CALCIUM) 500 MG chewable tablet Chew 2 tablets by mouth as needed for indigestion or heartburn.    ? Cholecalciferol (VITAMIN D3) 1000 UNITS CAPS Take 1,000 Units by mouth in the morning and at bedtime.    ? colchicine 0.6 MG tablet Take 1 tablet (0.6 mg total) by mouth daily. 20 tablet 0  ? cyanocobalamin (,VITAMIN B-12,) 1000 MCG/ML injection Inject into the muscle.    ? diltiazem (DILACOR XR) 120 MG 24 hr capsule Take 120 mg by mouth daily.    ? gabapentin (NEURONTIN) 300 MG capsule TAKE 3 CAPSULES BY MOUTH AT BEDTIME 90 capsule 0  ? Glucosamine-Chondroit-Vit C-Mn (GLUCOSAMINE CHONDR 1500 COMPLX PO) Take 2 capsules by mouth in the morning and at bedtime.    ? insulin glargine (LANTUS) 100 UNIT/ML injection Inject 24-26 Units into the skin at bedtime.    ? metFORMIN (GLUCOPHAGE) 1000 MG tablet Take 1,000 mg by mouth 2 (two) times daily.    ? methylPREDNISolone (MEDROL DOSEPAK) 4 MG TBPK tablet Day 1: Take 8 mg (2 tablets) before breakfast, 4 mg (1 tablet) after lunch, 4 mg (1 tablet) after supper, and 8 mg (2 tablets) at bedtime. Day 2:Take 4 mg (1 tablet) before breakfast, 4  mg (1 tablet) after lunch, 4 mg (1 tablet) after supper, and 8 mg (2 tablets) at bedtime. Day 3: Take 4 mg (1 tablet) before breakfast, 4 mg (1 tablet) after lunch, 4 mg (1 tablet) after supper, and 4 mg (1 tablet) at bedtime. Day 4: Take 4 mg (1 tablet) before breakfast, 4 mg (1 tablet) after lunch, and 4 mg (1 tablet) at bedtime. Day 5: Take 4 mg (1 tablet) before breakfast and 4 mg (1 tablet) at bedtime. Day 6: Take 4 mg (1 tablet) before breakfast. 1 each 0  ? metoprolol succinate (TOPROL-XL) 50 MG 24 hr tablet Take 1 tablet by mouth in the morning and at bedtime.    ? omeprazole (PRILOSEC) 40 MG capsule Take 1 capsule  (40 mg total) by mouth daily. 90 capsule 0  ? XARELTO 20 MG TABS tablet TAKE 1 TABLET BY MOUTH ONCE DAILY WITH SUPPER 90 tablet 0  ? Zinc Oxide 10 % OINT Apply 1 each topically daily as needed. 85 g 0  ? ?No current facility-administered medications for this visit.  ? ? ?SURGICAL HISTORY:  ?Past Surgical History:  ?Procedure Laterality Date  ? APPENDECTOMY  1957  ? BREAST EXCISIONAL BIOPSY Right 1966  ? CATARACT EXTRACTION, BILATERAL    ? CHOLECYSTECTOMY  10/2008  ? lump removed right breast  1958  ? bENIGN  ? right ankle plate  [id number]  ? VAGINAL HYSTERECTOMY  1978  ? ? ?REVIEW OF SYSTEMS:  A comprehensive review of systems was negative.  ? ?PHYSICAL EXAMINATION: General appearance: alert, cooperative, and no distress ?Head: Normocephalic, without obvious abnormality, atraumatic ?Neck: no adenopathy, no JVD, supple, symmetrical, trachea midline, and thyroid not enlarged, symmetric, no tenderness/mass/nodules ?Lymph nodes: Cervical, supraclavicular, and axillary nodes normal. ?Resp: clear to auscultation bilaterally ?Back: symmetric, no curvature. ROM normal. No CVA tenderness. ?Cardio: regular rate and rhythm, S1, S2 normal, no murmur, click, rub or gallop ?GI: soft, non-tender; bowel sounds normal; no masses,  no organomegaly ?Extremities: extremities normal, atraumatic, no cyanosis or edema ? ?ECOG PERFORMANCE STATUS: 1 - Symptomatic but completely ambulatory ? ?Blood pressure (!) 151/80, pulse 94, temperature 97.6 ?F (36.4 ?C), temperature source Oral, resp. rate 18, weight 169 lb 4 oz (76.8 kg), SpO2 96 %. ? ?LABORATORY DATA: ?Lab Results  ?Component Value Date  ? WBC 8.2 11/26/2021  ? HGB 13.8 11/26/2021  ? HCT 41.5 11/26/2021  ? MCV 96.3 11/26/2021  ? PLT 253 11/26/2021  ? ? ?  Chemistry   ?   ?Component Value Date/Time  ? NA 141 11/26/2021 1146  ? NA 139 02/26/2021 1100  ? K 4.3 11/26/2021 1146  ? CL 102 11/26/2021 1146  ? CO2 31 11/26/2021 1146  ? BUN 11 11/26/2021 1146  ? BUN 12 02/26/2021 1100  ? CREATININE  0.77 11/26/2021 1146  ?    ?Component Value Date/Time  ? CALCIUM 9.4 11/26/2021 1146  ? ALKPHOS 66 04/27/2021 1112  ? AST 14 11/26/2021 1146  ? AST 28 04/27/2021 1112  ? ALT 16 11/26/2021 1146  ? ALT 27 04/27/2021 1112  ? BILITOT 0.7 11/26/2021 1146  ? BILITOT 0.7 04/27/2021 1112  ?  ? ? ? ?RADIOGRAPHIC STUDIES: ?No results found. ? ?ASSESSMENT AND PLAN: This is a very pleasant 87 years old white female with history of microcytic anemia secondary to iron deficiency of unclear etiology but likely from gastrointestinal blood loss.  The patient was treated with Venofer 300 mg IV weekly for 3 weeks and tolerated her treatment well.  She was also found to have vitamin B12 deficiency and she received vitamin B12 injection weekly for 4 weeks.  She is currently on monthly vitamin B12 injection and tolerating it fairly well.  The patient had repeat CBC performed earlier today that showed no concerning abnormalities.  Iron study, ferritin as well as vitamin B level are still pending. ?I recommended for the patient to continue her treatment with the vitamin B12 injection and iron supplements.  She will check with her primary care physician to see if she can receive the injection close to home with her PCP. ?I will see the patient back for follow-up visit in 6 months for evaluation and repeat blood work. ?She was advised to call immediately if she has any other concerning issues in the interval. ? ?The patient voices understanding of current disease status and treatment options and is in agreement with the current care plan. ? ?All questions were answered. The patient knows to call the clinic with any problems, questions or concerns. We can certainly see the patient much sooner if necessary. ? ? ?Disclaimer: This note was dictated with voice recognition software. Similar sounding words can inadvertently be transcribed and may not be corrected upon review. ? ? ?  ?   ?

## 2022-03-03 ENCOUNTER — Telehealth: Payer: Self-pay | Admitting: Internal Medicine

## 2022-03-03 NOTE — Telephone Encounter (Signed)
Copied from [place] [phone]. Topic: Medicare AWV ?>> Mar 03, 2022  3:40 PM Cher Nakai R wrote: ?Reason for CRM:  ? ?Left message for patient to call back and schedule Medicare Annual Wellness Visit (AWV) in office.  ? ?If unable to come into the office for AWV,  please offer to do virtually or by telephone. ? ?No hx of AWV eligible for AWVI per [place] as of 01/22/2022 ? ?Please schedule at anytime with [place].     ? ?45 minute appointment  ? ?Any questions, please call me at [phone] ?

## 2022-03-03 NOTE — Telephone Encounter (Signed)
per 05/10 los, called patient and voicemail was left.  ?

## 2022-03-07 ENCOUNTER — Inpatient Hospital Stay: Payer: Medicare Other

## 2022-03-07 ENCOUNTER — Other Ambulatory Visit: Payer: Self-pay

## 2022-03-07 DIAGNOSIS — D500 Iron deficiency anemia secondary to blood loss (chronic): Secondary | ICD-10-CM

## 2022-03-07 DIAGNOSIS — E538 Deficiency of other specified B group vitamins: Secondary | ICD-10-CM | POA: Diagnosis not present

## 2022-03-07 DIAGNOSIS — D531 Other megaloblastic anemias, not elsewhere classified: Secondary | ICD-10-CM

## 2022-03-07 DIAGNOSIS — D509 Iron deficiency anemia, unspecified: Secondary | ICD-10-CM | POA: Diagnosis not present

## 2022-03-07 MED ORDER — CYANOCOBALAMIN 1000 MCG/ML IJ SOLN
1000.0000 ug | INTRAMUSCULAR | Status: DC
  Administered 2022-03-07: 1000 ug via INTRAMUSCULAR
  Filled 2022-03-07: qty 1

## 2022-03-08 ENCOUNTER — Ambulatory Visit
Admission: RE | Admit: 2022-03-08 | Discharge: 2022-03-08 | Disposition: A | Discharge status: Home / Self Care | Payer: Medicare Other | Source: Ambulatory Visit | Attending: Physician Assistant | Admitting: Physician Assistant

## 2022-03-08 DIAGNOSIS — H903 Sensorineural hearing loss, bilateral: Secondary | ICD-10-CM

## 2022-03-08 DIAGNOSIS — G319 Degenerative disease of nervous system, unspecified: Secondary | ICD-10-CM | POA: Diagnosis not present

## 2022-03-08 DIAGNOSIS — I6782 Cerebral ischemia: Secondary | ICD-10-CM | POA: Diagnosis not present

## 2022-03-08 DIAGNOSIS — I639 Cerebral infarction, unspecified: Secondary | ICD-10-CM | POA: Diagnosis not present

## 2022-03-08 DIAGNOSIS — H905 Unspecified sensorineural hearing loss: Secondary | ICD-10-CM | POA: Diagnosis not present

## 2022-03-08 MED ORDER — GADOBENATE DIMEGLUMINE 529 MG/ML IV SOLN
15.0000 mL | Freq: Once | INTRAVENOUS | Status: AC | PRN
  Administered 2022-03-08: 15 mL via INTRAVENOUS

## 2022-03-22 ENCOUNTER — Other Ambulatory Visit: Payer: Self-pay | Admitting: Internal Medicine

## 2022-03-23 NOTE — Telephone Encounter (Signed)
Requested Prescriptions  Pending Prescriptions Disp Refills  . gabapentin (NEURONTIN) 300 MG capsule [Pharmacy Med Name: Gabapentin 300 MG Oral Capsule] 90 capsule 0    Sig: TAKE 3 CAPSULES BY MOUTH AT BEDTIME     Neurology: Anticonvulsants - gabapentin Passed - 03/22/2022 11:07 PM      Passed - Cr in normal range and within 360 days    Creat  Date Value Ref Range Status  11/26/2021 0.77 0.60 - 0.95 mg/dL Final   Creatinine, Urine  Date Value Ref Range Status  11/26/2021 104 20 - 275 mg/dL Final         Passed - Completed PHQ-2 or PHQ-9 in the last 360 days      Passed - Valid encounter within last 12 months    Recent Outpatient Visits          3 weeks ago History of gout   [place], DO   2 months ago Acute gout involving toe of right foot, unspecified cause   [place], DO   2 months ago Acute gout involving toe of right foot, unspecified cause   [place], DO   3 months ago Hypertension, unspecified type   [facility] [name], DO      Future Appointments            In 5 months [name], [facility], [facility]

## 2022-03-30 ENCOUNTER — Inpatient Hospital Stay: Payer: Medicare Other | Attending: Internal Medicine

## 2022-03-30 ENCOUNTER — Other Ambulatory Visit: Payer: Self-pay

## 2022-03-30 DIAGNOSIS — D500 Iron deficiency anemia secondary to blood loss (chronic): Secondary | ICD-10-CM

## 2022-03-30 DIAGNOSIS — D531 Other megaloblastic anemias, not elsewhere classified: Secondary | ICD-10-CM

## 2022-03-30 DIAGNOSIS — E538 Deficiency of other specified B group vitamins: Secondary | ICD-10-CM | POA: Insufficient documentation

## 2022-03-30 NOTE — Progress Notes (Signed)
Patient was here a week early for her injection MD directed that she reschedule for on week later

## 2022-04-06 ENCOUNTER — Other Ambulatory Visit: Payer: Self-pay

## 2022-04-06 ENCOUNTER — Inpatient Hospital Stay: Payer: Medicare Other

## 2022-04-06 DIAGNOSIS — E538 Deficiency of other specified B group vitamins: Secondary | ICD-10-CM | POA: Diagnosis not present

## 2022-04-06 DIAGNOSIS — D500 Iron deficiency anemia secondary to blood loss (chronic): Secondary | ICD-10-CM

## 2022-04-06 DIAGNOSIS — D531 Other megaloblastic anemias, not elsewhere classified: Secondary | ICD-10-CM

## 2022-04-06 MED ORDER — CYANOCOBALAMIN 1000 MCG/ML IJ SOLN
1000.0000 ug | INTRAMUSCULAR | Status: DC
  Administered 2022-04-06: 1000 ug via INTRAMUSCULAR
  Filled 2022-04-06: qty 1

## 2022-04-15 ENCOUNTER — Other Ambulatory Visit: Payer: Self-pay | Admitting: Internal Medicine

## 2022-04-20 ENCOUNTER — Other Ambulatory Visit: Payer: Self-pay | Admitting: Internal Medicine

## 2022-04-20 MED ORDER — METFORMIN HCL 1000 MG PO TABS: 1000.0000 mg | ORAL_TABLET | Freq: Two times a day (BID) | ORAL | 1 refills | Status: DC

## 2022-04-20 NOTE — Telephone Encounter (Signed)
Medication Refill - Medication: metFORMIN (GLUCOPHAGE) 1000 MG tablet  Has the patient contacted their pharmacy? Yes.    (Agent: If yes, when and what did the pharmacy advise?) call provider  Preferred Pharmacy (with phone number or street name):  [place], [place] Phone:  [phone]  Fax:  [phone]     Has the patient been seen for an appointment in the last year OR does the patient have an upcoming appointment? Yes.    Agent: Please be advised that RX refills may take up to 3 business days. We ask that you follow-up with your pharmacy.

## 2022-04-20 NOTE — Telephone Encounter (Signed)
Requested medication (s) are due for refill today: yes  Requested medication (s) are on the active medication list: yes  Last refill:  04/21/21  Future visit scheduled: no  Notes to clinic:  Unable to refill per protocol, last refill by a historical provider.      Requested Prescriptions  Pending Prescriptions Disp Refills   metFORMIN (GLUCOPHAGE) 1000 MG tablet      Sig: Take 1 tablet (1,000 mg total) by mouth 2 (two) times daily.     Endocrinology:  Diabetes - Biguanides Passed - 04/20/2022  2:53 PM      Passed - Cr in normal range and within 360 days    Creat  Date Value Ref Range Status  11/26/2021 0.77 0.60 - 0.95 mg/dL Final   Creatinine, Urine  Date Value Ref Range Status  11/26/2021 104 20 - 275 mg/dL Final         Passed - HBA1C is between 0 and 7.9 and within 180 days    Hgb A1c MFr Bld  Date Value Ref Range Status  11/26/2021 7.3 (H) <5.7 % of total Hgb Final    Comment:    For someone without known diabetes, a hemoglobin A1c value of 6.5% or greater indicates that they may have  diabetes and this should be confirmed with a follow-up  test. . For someone with known diabetes, a value <7% indicates  that their diabetes is well controlled and a value  greater than or equal to 7% indicates suboptimal  control. A1c targets should be individualized based on  duration of diabetes, age, comorbid conditions, and  other considerations. . Currently, no consensus exists regarding use of hemoglobin A1c for diagnosis of diabetes for children. .          Passed - eGFR in normal range and within 360 days    GFR calc Af Amer  Date Value Ref Range Status  11/12/2008  >60 mL/min Final   >60        The eGFR has been calculated using the MDRD equation. This calculation has not been validated in all clinical situations. eGFR's persistently <60 mL/min signify possible Chronic Kidney Disease.   GFR, Estimated  Date Value Ref Range Status  04/27/2021 >60 >60 mL/min  Final    Comment:    (NOTE) Calculated using the CKD-EPI Creatinine Equation (2021)    eGFR  Date Value Ref Range Status  11/26/2021 76 > OR = 60 mL/min/1.21m Final    Comment:    The eGFR is based on the CKD-EPI 2021 equation. To calculate  the new eGFR from a previous Creatinine or Cystatin C result, go to https://www.kidney.org/professionals/ kdoqi/gfr%5Fcalculator   02/26/2021 71 >59 mL/min/1.73 Final         Passed - B12 Level in normal range and within 720 days    Vitamin B-12  Date Value Ref Range Status  03/02/2022 236 180 - 914 pg/mL Final    Comment:    (NOTE) This assay is not validated for testing neonatal or myeloproliferative syndrome specimens for Vitamin B12 levels. [facility] [address], [place] [address]         Passed - Valid encounter within last 6 months    Recent Outpatient Visits           1 month ago History of gout   [place]   3 months ago Acute gout involving toe of right foot, unspecified cause  [facility] [name], DO   3 months ago Acute gout involving toe of right foot, unspecified cause   [place], DO   4 months ago Hypertension, unspecified type   [facility] [name], DO       Future Appointments             In 4 months [name], [facility], [place] within normal limits and completed in the last 12 months    WBC  Date Value Ref Range Status  11/26/2021 8.2 3.8 - 10.8 Thousand/uL Final   WBC Count  Date Value Ref Range Status  03/02/2022 7.2 4.0 - 10.5 K/uL Final   RBC  Date Value Ref Range Status  03/02/2022 4.21 3.87 - 5.11 MIL/uL Final   Hemoglobin  Date Value Ref Range Status  03/02/2022 13.3 12.0 - 15.0 g/dL Final  04/02/2021 8.6 (L) 11.1 - 15.9 g/dL Final    HCT  Date Value Ref Range Status  03/02/2022 40.3 36.0 - 46.0 % Final   Hematocrit  Date Value Ref Range Status  04/02/2021 29.1 (L) 34.0 - 46.6 % Final   MCHC  Date Value Ref Range Status  03/02/2022 33.0 30.0 - 36.0 g/dL Final   [facility]  Date Value Ref Range Status  03/02/2022 31.6 26.0 - 34.0 pg Final   MCV  Date Value Ref Range Status  03/02/2022 95.7 80.0 - 100.0 fL Final  04/02/2021 76 (L) 79 - 97 fL Final   No results found for: "PLTCOUNTKUC", "LABPLAT", "POCPLA" RDW  Date Value Ref Range Status  03/02/2022 12.9 11.5 - 15.5 % Final  04/02/2021 17.2 (H) 11.7 - 15.4 % Final

## 2022-04-21 DIAGNOSIS — Z794 Long term (current) use of insulin: Secondary | ICD-10-CM | POA: Diagnosis not present

## 2022-04-21 DIAGNOSIS — H9190 Unspecified hearing loss, unspecified ear: Secondary | ICD-10-CM | POA: Insufficient documentation

## 2022-04-21 DIAGNOSIS — B351 Tinea unguium: Secondary | ICD-10-CM | POA: Diagnosis not present

## 2022-04-21 DIAGNOSIS — L851 Acquired keratosis [keratoderma] palmaris et plantaris: Secondary | ICD-10-CM | POA: Diagnosis not present

## 2022-04-21 DIAGNOSIS — Z6831 Body mass index (BMI) 31.0-31.9, adult: Secondary | ICD-10-CM | POA: Insufficient documentation

## 2022-04-21 DIAGNOSIS — E1140 Type 2 diabetes mellitus with diabetic neuropathy, unspecified: Secondary | ICD-10-CM | POA: Diagnosis not present

## 2022-04-21 DIAGNOSIS — Z1211 Encounter for screening for malignant neoplasm of colon: Secondary | ICD-10-CM | POA: Insufficient documentation

## 2022-04-21 DIAGNOSIS — F419 Anxiety disorder, unspecified: Secondary | ICD-10-CM | POA: Insufficient documentation

## 2022-04-21 DIAGNOSIS — E1165 Type 2 diabetes mellitus with hyperglycemia: Secondary | ICD-10-CM | POA: Insufficient documentation

## 2022-04-21 DIAGNOSIS — F5104 Psychophysiologic insomnia: Secondary | ICD-10-CM | POA: Insufficient documentation

## 2022-04-27 ENCOUNTER — Ambulatory Visit: Payer: Medicare Other

## 2022-05-04 ENCOUNTER — Inpatient Hospital Stay: Payer: Medicare Other | Attending: Internal Medicine

## 2022-05-04 ENCOUNTER — Other Ambulatory Visit: Payer: Self-pay

## 2022-05-04 DIAGNOSIS — E538 Deficiency of other specified B group vitamins: Secondary | ICD-10-CM | POA: Insufficient documentation

## 2022-05-04 DIAGNOSIS — D531 Other megaloblastic anemias, not elsewhere classified: Secondary | ICD-10-CM

## 2022-05-04 DIAGNOSIS — D500 Iron deficiency anemia secondary to blood loss (chronic): Secondary | ICD-10-CM

## 2022-05-04 MED ORDER — CYANOCOBALAMIN 1000 MCG/ML IJ SOLN
1000.0000 ug | INTRAMUSCULAR | Status: DC
  Administered 2022-05-04: 1000 ug via INTRAMUSCULAR
  Filled 2022-05-04: qty 1

## 2022-06-01 ENCOUNTER — Inpatient Hospital Stay: Payer: Medicare Other | Attending: Internal Medicine

## 2022-06-01 ENCOUNTER — Other Ambulatory Visit: Payer: Self-pay

## 2022-06-01 ENCOUNTER — Ambulatory Visit: Payer: Medicare Other

## 2022-06-01 VITALS — BP 138/88 | HR 79 | Resp 18

## 2022-06-01 DIAGNOSIS — E538 Deficiency of other specified B group vitamins: Secondary | ICD-10-CM | POA: Insufficient documentation

## 2022-06-01 DIAGNOSIS — D531 Other megaloblastic anemias, not elsewhere classified: Secondary | ICD-10-CM

## 2022-06-01 DIAGNOSIS — D500 Iron deficiency anemia secondary to blood loss (chronic): Secondary | ICD-10-CM

## 2022-06-01 MED ORDER — CYANOCOBALAMIN 1000 MCG/ML IJ SOLN
1000.0000 ug | INTRAMUSCULAR | Status: DC
  Administered 2022-06-01: 1000 ug via INTRAMUSCULAR
  Filled 2022-06-01: qty 1

## 2022-06-03 ENCOUNTER — Other Ambulatory Visit: Payer: Self-pay | Admitting: Cardiovascular Disease

## 2022-06-03 DIAGNOSIS — I4821 Permanent atrial fibrillation: Secondary | ICD-10-CM

## 2022-06-03 NOTE — Telephone Encounter (Signed)
Prescription refill request for Xarelto received.  Indication: Afib  Last office visit: 12/06/21 ([place]) Weight: 76.8kg Age: 87 Scr: 0.77 (11/26/21) CrCl:  64.83m/min  Appropriate dose and refill sent to requested pharmacy.

## 2022-06-06 ENCOUNTER — Other Ambulatory Visit: Payer: Self-pay | Admitting: Internal Medicine

## 2022-06-06 MED ORDER — GLUCOSE BLOOD VI STRP
ORAL_STRIP | 12 refills | Status: DC
Start: 2022-06-06 — End: 2022-06-24

## 2022-06-06 NOTE — Telephone Encounter (Unsigned)
Copied from [place] [id number]. Topic: General - Other >> Jun 06, 2022 10:08 AM Everette C wrote: Reason for CRM: Medication Refill - Medication: accu check glucose blood test strips / rx# [id number]  Has the patient contacted their pharmacy? Yes.  The patient was directed to contact their PCP  (Agent: If no, request that the patient contact the pharmacy for the refill. If patient does not wish to contact the pharmacy document the reason why and proceed with request.) (Agent: If yes, when and what did the pharmacy advise?)  Preferred Pharmacy (with phone number or street name): [place], [place] - [address] [place] [address] Phone: [phone] Fax: [phone] Hours: Not open 24 hours   Has the patient been seen for an appointment in the last year OR does the patient have an upcoming appointment? Yes.    Agent: Please be advised that RX refills may take up to 3 business days. We ask that you follow-up with your pharmacy.

## 2022-06-07 ENCOUNTER — Other Ambulatory Visit: Payer: Self-pay | Admitting: Internal Medicine

## 2022-06-07 NOTE — Telephone Encounter (Signed)
Requested Prescriptions  Pending Prescriptions Disp Refills  . gabapentin (NEURONTIN) 300 MG capsule [Pharmacy Med Name: Gabapentin 300 MG Oral Capsule] 90 capsule 0    Sig: TAKE 3 CAPSULES BY MOUTH AT BEDTIME     Neurology: Anticonvulsants - gabapentin Passed - 06/07/2022  2:34 PM      Passed - Cr in normal range and within 360 days    Creat  Date Value Ref Range Status  11/26/2021 0.77 0.60 - 0.95 mg/dL Final   Creatinine, Urine  Date Value Ref Range Status  11/26/2021 104 20 - 275 mg/dL Final         Passed - Completed PHQ-2 or PHQ-9 in the last 360 days      Passed - Valid encounter within last 12 months    Recent Outpatient Visits          3 months ago History of gout   [place], DO   5 months ago Acute gout involving toe of right foot, unspecified cause   [place], DO   5 months ago Acute gout involving toe of right foot, unspecified cause   [place], DO   6 months ago Hypertension, unspecified type   [facility] [name], DO      Future Appointments            In 2 months [name], [facility], [facility]

## 2022-06-08 ENCOUNTER — Ambulatory Visit: Payer: Medicare Other

## 2022-06-21 ENCOUNTER — Telehealth: Payer: Self-pay | Admitting: Internal Medicine

## 2022-06-21 NOTE — Telephone Encounter (Signed)
Patient called in checking on the status of a previous request for accu check aviva plus glucose test strips. She has the meter but just needs the strips called into her [place], [place] Phone:  [phone]  Fax:  [phone]     Please assist patient further as she is almost out of her strips

## 2022-06-24 ENCOUNTER — Other Ambulatory Visit: Payer: Self-pay | Admitting: Internal Medicine

## 2022-06-24 ENCOUNTER — Telehealth: Payer: Self-pay | Admitting: Internal Medicine

## 2022-06-24 DIAGNOSIS — E1165 Type 2 diabetes mellitus with hyperglycemia: Secondary | ICD-10-CM

## 2022-06-24 MED ORDER — GLUCOSE BLOOD VI STRP: ORAL_STRIP | 12 refills | Status: DC

## 2022-06-24 NOTE — Telephone Encounter (Signed)
Patient states that her insurance does not want to cover glucose blood test strip  with instructions "use as directed". They are requiring more specific instructions of use. Please advise

## 2022-06-24 NOTE — Telephone Encounter (Signed)
Insurance is requiring more specific instruction on test strips for coverage- please review for new Rx

## 2022-06-29 ENCOUNTER — Ambulatory Visit: Payer: Medicare Other

## 2022-06-29 ENCOUNTER — Inpatient Hospital Stay: Payer: Medicare Other | Attending: Internal Medicine

## 2022-06-29 ENCOUNTER — Other Ambulatory Visit: Payer: Self-pay

## 2022-06-29 DIAGNOSIS — L578 Other skin changes due to chronic exposure to nonionizing radiation: Secondary | ICD-10-CM | POA: Diagnosis not present

## 2022-06-29 DIAGNOSIS — E538 Deficiency of other specified B group vitamins: Secondary | ICD-10-CM | POA: Insufficient documentation

## 2022-06-29 DIAGNOSIS — D500 Iron deficiency anemia secondary to blood loss (chronic): Secondary | ICD-10-CM

## 2022-06-29 DIAGNOSIS — L820 Inflamed seborrheic keratosis: Secondary | ICD-10-CM | POA: Diagnosis not present

## 2022-06-29 DIAGNOSIS — D531 Other megaloblastic anemias, not elsewhere classified: Secondary | ICD-10-CM

## 2022-06-29 DIAGNOSIS — D225 Melanocytic nevi of trunk: Secondary | ICD-10-CM | POA: Diagnosis not present

## 2022-06-29 DIAGNOSIS — L812 Freckles: Secondary | ICD-10-CM | POA: Diagnosis not present

## 2022-06-29 DIAGNOSIS — Z85828 Personal history of other malignant neoplasm of skin: Secondary | ICD-10-CM | POA: Diagnosis not present

## 2022-06-29 DIAGNOSIS — L821 Other seborrheic keratosis: Secondary | ICD-10-CM | POA: Diagnosis not present

## 2022-06-29 MED ORDER — CYANOCOBALAMIN 1000 MCG/ML IJ SOLN
1000.0000 ug | INTRAMUSCULAR | Status: DC
  Administered 2022-06-29: 1000 ug via INTRAMUSCULAR
  Filled 2022-06-29: qty 1

## 2022-07-06 ENCOUNTER — Telehealth: Payer: Self-pay | Admitting: Internal Medicine

## 2022-07-06 DIAGNOSIS — E1165 Type 2 diabetes mellitus with hyperglycemia: Secondary | ICD-10-CM

## 2022-07-07 NOTE — Telephone Encounter (Signed)
Requested medication (s) are due for refill today: yes, with a different doctor  Requested medication (s) are on the active medication list: yes  Last refill:  06/24/22  Future visit scheduled: yes  Notes to clinic:  Unable to refill per protocol, last refill by provider 06/24/22. Pharmacy request:please resend with a different doctor per insurance. thanks     Requested Prescriptions  Pending Prescriptions Disp Refills   ACCU-CHEK AVIVA PLUS test strip [Pharmacy Med Name: ACCU-CHEK AVIVA PLUS  TES] 100 each 12    Sig: USE 1 STRIP TO [place]     Endocrinology: Diabetes - Testing Supplies Passed - 07/06/2022  2:58 PM      Passed - Valid encounter within last 12 months    Recent Outpatient Visits           4 months ago History of gout   [facility] [name], DO   6 months ago Acute gout involving toe of right foot, unspecified cause   [facility] [name], DO   6 months ago Acute gout involving toe of right foot, unspecified cause   [place], DO   7 months ago Hypertension, unspecified type   [facility] [name], DO       Future Appointments             In 1 month [name], [facility], [facility]

## 2022-07-14 ENCOUNTER — Other Ambulatory Visit: Payer: Self-pay

## 2022-07-14 DIAGNOSIS — Z794 Long term (current) use of insulin: Secondary | ICD-10-CM

## 2022-07-14 NOTE — Telephone Encounter (Signed)
Pt notified, pharmacy called they have discontinued the viva and pharmacist told me to write new rx for guide.  Pt notified

## 2022-07-14 NOTE — Telephone Encounter (Signed)
Pt stated that she has not been able to pick up her [place] test strip pharmacy advised whether or not this would be filled it's between the insurance and Dr.Andrews.  Pt stated she is unsure of what this means, and she really needs her ACCU-CHEK AVIVA PLUS test strip.   Pharmacy advised her they would give her a few to pick up today.  Please advise.

## 2022-07-14 NOTE — Progress Notes (Signed)
Per pharmacist they are discontinuing the viva and requested we send new orders for accu chek guide

## 2022-07-15 ENCOUNTER — Other Ambulatory Visit: Payer: Self-pay | Admitting: Internal Medicine

## 2022-07-15 MED ORDER — ACCU-CHEK GUIDE VI STRP: ORAL_STRIP | 12 refills | Status: DC

## 2022-07-15 MED ORDER — ACCU-CHEK SOFTCLIX LANCETS MISC: 12 refills | Status: AC

## 2022-07-15 MED ORDER — ACCU-CHEK GUIDE W/DEVICE KIT: PACK | 0 refills | Status: AC

## 2022-07-24 ENCOUNTER — Other Ambulatory Visit: Payer: Self-pay | Admitting: Internal Medicine

## 2022-07-24 ENCOUNTER — Other Ambulatory Visit: Payer: Self-pay | Admitting: Cardiovascular Disease

## 2022-07-25 NOTE — Telephone Encounter (Signed)
Requested Prescriptions  Pending Prescriptions Disp Refills  . gabapentin (NEURONTIN) 300 MG capsule [Pharmacy Med Name: Gabapentin 300 MG Oral Capsule] 90 capsule 2    Sig: TAKE 3 CAPSULES BY MOUTH AT BEDTIME     Neurology: Anticonvulsants - gabapentin Passed - 07/24/2022 10:53 PM      Passed - Cr in normal range and within 360 days    Creat  Date Value Ref Range Status  11/26/2021 0.77 0.60 - 0.95 mg/dL Final   Creatinine, Urine  Date Value Ref Range Status  11/26/2021 104 20 - 275 mg/dL Final         Passed - Completed PHQ-2 or PHQ-9 in the last 360 days      Passed - Valid encounter within last 12 months    Recent Outpatient Visits          5 months ago History of gout   [facility] [name], DO   6 months ago Acute gout involving toe of right foot, unspecified cause   [facility] [name], DO   6 months ago Acute gout involving toe of right foot, unspecified cause   [place], DO   8 months ago Hypertension, unspecified type   [facility] [name], DO      Future Appointments            In 1 month [name], [facility], [facility]

## 2022-07-27 ENCOUNTER — Telehealth: Payer: Self-pay | Admitting: Cardiovascular Disease

## 2022-07-27 ENCOUNTER — Inpatient Hospital Stay: Payer: Medicare Other | Attending: Internal Medicine

## 2022-07-27 ENCOUNTER — Ambulatory Visit: Payer: Medicare Other

## 2022-07-27 DIAGNOSIS — D500 Iron deficiency anemia secondary to blood loss (chronic): Secondary | ICD-10-CM

## 2022-07-27 DIAGNOSIS — Z794 Long term (current) use of insulin: Secondary | ICD-10-CM | POA: Diagnosis not present

## 2022-07-27 DIAGNOSIS — E538 Deficiency of other specified B group vitamins: Secondary | ICD-10-CM | POA: Insufficient documentation

## 2022-07-27 DIAGNOSIS — B351 Tinea unguium: Secondary | ICD-10-CM | POA: Diagnosis not present

## 2022-07-27 DIAGNOSIS — D531 Other megaloblastic anemias, not elsewhere classified: Secondary | ICD-10-CM

## 2022-07-27 DIAGNOSIS — L851 Acquired keratosis [keratoderma] palmaris et plantaris: Secondary | ICD-10-CM | POA: Diagnosis not present

## 2022-07-27 DIAGNOSIS — E1140 Type 2 diabetes mellitus with diabetic neuropathy, unspecified: Secondary | ICD-10-CM | POA: Diagnosis not present

## 2022-07-27 MED ORDER — CYANOCOBALAMIN 1000 MCG/ML IJ SOLN
1000.0000 ug | INTRAMUSCULAR | Status: DC
  Administered 2022-07-27: 1000 ug via INTRAMUSCULAR
  Filled 2022-07-27: qty 1

## 2022-07-27 MED ORDER — DILTIAZEM HCL ER 120 MG PO CP24: 120.0000 mg | ORAL_CAPSULE | Freq: Every day | ORAL | 3 refills | Status: DC

## 2022-07-27 NOTE — Telephone Encounter (Signed)
-  Spoke to pt. She reported she tried to pick up her diltiazem but was informed by the pharmacy that it was discontinued by Dr. Marisue Ivan on 10/2. -Per last office visit on 12/06/21, MD recommended to continue diltiazem 120 mg daily.  -Nex Rx sent as requested

## 2022-07-27 NOTE — Telephone Encounter (Signed)
   Pt c/o medication issue:  1. Name of Medication: diltiazem (DILACOR XR) 120 MG 24 hr capsule  2. How are you currently taking this medication (dosage and times per day)? Take 120 mg by mouth daily.  3. Are you having a reaction (difficulty breathing--STAT)? No   4. What is your medication issue? Pt said, she tried picking up her prescription and per her pharmacy they received a message from Dr. Audie Box on 07/25/22 that this medication was discontinued

## 2022-08-05 ENCOUNTER — Ambulatory Visit: Payer: Medicare Other

## 2022-08-11 DIAGNOSIS — E119 Type 2 diabetes mellitus without complications: Secondary | ICD-10-CM | POA: Diagnosis not present

## 2022-08-11 DIAGNOSIS — H26493 Other secondary cataract, bilateral: Secondary | ICD-10-CM | POA: Diagnosis not present

## 2022-08-11 DIAGNOSIS — H524 Presbyopia: Secondary | ICD-10-CM | POA: Diagnosis not present

## 2022-08-11 DIAGNOSIS — Z961 Presence of intraocular lens: Secondary | ICD-10-CM | POA: Diagnosis not present

## 2022-08-11 LAB — HM DIABETES EYE EXAM

## 2022-08-22 ENCOUNTER — Ambulatory Visit: Payer: Self-pay | Admitting: *Deleted

## 2022-08-22 NOTE — Telephone Encounter (Signed)
  Chief Complaint: vaginal bleeding Symptoms: started yesterday Frequency: has been several times Pertinent Negatives: Patient denies feeling light headed Disposition: '[]'$ ED /'[]'$ Urgent Care (no appt availability in office) / '[x]'$ Appointment(In office/virtual)/ '[]'$  [place] [facility]/ '[]'$ Home Care/ '[]'$ Refused Recommended Disposition /'[]'$ [place] [facility]/ '[]'$  Follow-up with PCP Additional Notes: appt tomorrow, home care discussed. Pt is on Xarelto.   Reason for Disposition  Taking Coumadin (warfarin) or other strong blood thinner, or known bleeding disorder (e.g., thrombocytopenia)  Answer Assessment - Initial Assessment Questions 1. AMOUNT: "Describe the bleeding that you are having." "How much bleeding is there?"    - SPOTTING: spotting, or pinkish / brownish mucous discharge; does not fill panty liner or pad    - MILD:  less than 1 pad / hour; less than patient's usual menstrual bleeding   - MODERATE: 1-2 pads / hour; 1 menstrual cup every 6 hours; small-medium blood clots (e.g., pea, grape, small coin)   - SEVERE: soaking 2 or more pads/hour for 2 or more hours; 1 menstrual cup every 2 hours; bleeding not contained by pads or continuous red blood from vagina; large blood clots (e.g., golf ball, large coin)      moderate 2. ONSET: "When did the bleeding begin?" "Is it continuing now?"     yesterday 3. MENOPAUSE: "When was your last menstrual period?"      post 4. ABDOMEN PAIN: "Do you have any pain?" "How bad is the pain?"  (e.g., Scale 1-10; mild, moderate, or severe)   - MILD (1-3): doesn't interfere with normal activities, abdomen soft and not tender to touch    - MODERATE (4-7): interferes with normal activities or awakens from sleep, abdomen tender to touch    - SEVERE (8-10): excruciating pain, doubled over, unable to do any normal activities      A little pain in left lower back 5. BLOOD THINNERS: "Do you take any blood thinners?" (e.g., Coumadin/warfarin, Pradaxa/dabigatran,  aspirin)     Xarelto 6. HORMONE MEDICINES: "Are you taking any hormone medicines, prescription or OTC?" (e.g., birth control pills, estrogen)     no 7. CAUSE: "What do you think is causing the bleeding?" (e.g., recent gyn surgery, recent gyn procedure; known bleeding disorder, uterine cancer)       Thinks it is vaginal bleeding 8. HEMODYNAMIC STATUS: "Are you weak or feeling lightheaded?" If Yes, ask: "Can you stand and walk normally?"       no 9. OTHER SYMPTOMS: "What other symptoms are you having with the bleeding?" (e.g., back pain, burning with urination, fever)     Back pain  Protocols used: Vaginal Bleeding - Postmenopausal-A-AH

## 2022-08-23 ENCOUNTER — Encounter: Payer: Self-pay | Admitting: Internal Medicine

## 2022-08-23 ENCOUNTER — Ambulatory Visit (INDEPENDENT_AMBULATORY_CARE_PROVIDER_SITE_OTHER): Payer: Medicare Other | Admitting: Internal Medicine

## 2022-08-23 VITALS — BP 116/64 | HR 89 | Temp 98.2°F | Resp 16 | Ht 64.0 in | Wt 173.6 lb

## 2022-08-23 DIAGNOSIS — N3001 Acute cystitis with hematuria: Secondary | ICD-10-CM

## 2022-08-23 DIAGNOSIS — R310 Gross hematuria: Secondary | ICD-10-CM | POA: Diagnosis not present

## 2022-08-23 DIAGNOSIS — K644 Residual hemorrhoidal skin tags: Secondary | ICD-10-CM | POA: Diagnosis not present

## 2022-08-23 DIAGNOSIS — Z23 Encounter for immunization: Secondary | ICD-10-CM | POA: Diagnosis not present

## 2022-08-23 LAB — POCT URINALYSIS DIPSTICK
Appearance: NORMAL
Bilirubin, UA: NEGATIVE
Blood, UA: POSITIVE
Glucose, UA: NEGATIVE
Ketones, UA: NEGATIVE
Nitrite, UA: NEGATIVE
Odor: NORMAL
Protein, UA: NEGATIVE
Spec Grav, UA: 1.02 (ref 1.010–1.025)
Urobilinogen, UA: 0.2 E.U./dL
pH, UA: 6 (ref 5.0–8.0)

## 2022-08-23 NOTE — Patient Instructions (Addendum)
It was great seeing you today!  Plan discussed at today's visit: -Use preparation H and sitz baths -If you notice anymore bleeding let me know right away -Continue Xarelto   Follow up in: in November   Take care and let us know if you have any questions or concerns prior to your next visit.  Dr. Rosana Berger

## 2022-08-23 NOTE — Progress Notes (Signed)
Acute Office Visit  Subjective:     Patient ID: Danielle [name], female    DOB: 11-09-1934, 87 y.o.   MRN: [id number]  Chief Complaint  Patient presents with   Vaginal Bleeding    Or hemorrhoids uncertain      HPI Patient is in today for 2 episodes of bright red blood in the toilet bowl.  She is concerned for vaginal bleeding.  She states she noticed it 2 days ago when she had a bowel movement. The blood in loose in the toilet bowl, not just on the stool itself. 2 days prior to the bleeding episode she took an Imodium for loose stools (chronic since she's had her gallbladder out) and had been straining to have a bowel movement. After the initial episode of bleeding she had 2 episodes of urination that did not have any gross blood. Later that night she had another bowel movement with a smaller amount of blood in the toilet bowl. She wore a panty liner over night and it was full of blood the next morning but she has not had any bleeding since in about a day and a half. She denies urinary symptoms, no dysuria or increased urinary urgency or frequency.  She does frequently have urinary incontinence but this is normal for her.  She does endorse some very mild suprapubic abdominal pain as well as left lower back pain.  She denies fevers.  She is on Xarelto for atrial fibrillation.  Review of Systems  Constitutional:  Negative for chills and fever.  Gastrointestinal:  Positive for abdominal pain, blood in stool, constipation and diarrhea. Negative for vomiting.  Genitourinary:  Positive for flank pain. Negative for dysuria, frequency and urgency.  Neurological:  Negative for dizziness.        Objective:    BP 116/64   Pulse 89   Temp 98.2 F (36.8 C)   Resp 16   Ht '5\' 4"'$  (1.626 m)   Wt 173 lb 9.6 oz (78.7 kg)   SpO2 98%   BMI 29.80 kg/m  BP Readings from Last 3 Encounters:  08/23/22 116/64  06/01/22 138/88  03/02/22 (!) 151/80   Wt Readings from Last 3 Encounters:  08/23/22 173  lb 9.6 oz (78.7 kg)  03/02/22 169 lb 4 oz (76.8 kg)  02/24/22 170 lb 1.6 oz (77.2 kg)      Physical Exam Exam conducted with a chaperone present.  Constitutional:      Appearance: Normal appearance.  HENT:     Head: Normocephalic and atraumatic.  Eyes:     Conjunctiva/sclera: Conjunctivae normal.  Cardiovascular:     Rate and Rhythm: Normal rate and regular rhythm.  Pulmonary:     Effort: Pulmonary effort is normal.     Breath sounds: Normal breath sounds.  Genitourinary:    General: Normal vulva.     Comments: External genitalia within normal limits.  Vaginal mucosa pink, with mild atrophy but no obvious bleeding sites or inflammation.  Nonfriable cervix without lesions, no discharge or bleeding noted on speculum exam.  Rectal exam with 3 large external hemorrhoids, no blood present.  Skin:    General: Skin is warm and dry.  Neurological:     General: No focal deficit present.     Mental Status: She is alert. Mental status is at baseline.  Psychiatric:        Mood and Affect: Mood normal.        Behavior: Behavior normal.     No  results found for any visits on 08/23/22.      Assessment & Plan:   1. Bleeding external hemorrhoids/Gross hematuria: Urine dip in the office showing blood and trace leukocytes, will send for culture to rule out urinary tract infection.  Vaginal exam negative for any signs of bleeding with mild atrophy but no inflammation.  External hemorrhoids present with history of straining 2 days prior to episode.  Not had any bleeding in the last half.  At this point I recommend she use Preparation H and sitz bath's well as fiber supplementation to bulk stools to try to prevent straining.  She is on Xarelto for atrial fibrillation with a CHA2DS2-VASc score of 3 she will continue this medication for cardiovascular protection.  She will continue to monitor for further bleeding.  - POCT urinalysis dipstick - Urine Culture  2. Need for influenza vaccination:  Flu vaccine administered today.  - Flu Vaccine QUAD High Dose(Fluad)   Return for already scheduled .  [name], DO

## 2022-08-24 ENCOUNTER — Inpatient Hospital Stay: Payer: Medicare Other | Attending: Internal Medicine

## 2022-08-24 ENCOUNTER — Inpatient Hospital Stay: Payer: Medicare Other

## 2022-08-24 ENCOUNTER — Inpatient Hospital Stay (HOSPITAL_BASED_OUTPATIENT_CLINIC_OR_DEPARTMENT_OTHER): Payer: Medicare Other | Admitting: Internal Medicine

## 2022-08-24 VITALS — BP 173/85 | HR 96 | Temp 98.2°F | Resp 16 | Wt 171.6 lb

## 2022-08-24 DIAGNOSIS — E538 Deficiency of other specified B group vitamins: Secondary | ICD-10-CM | POA: Diagnosis not present

## 2022-08-24 DIAGNOSIS — D500 Iron deficiency anemia secondary to blood loss (chronic): Secondary | ICD-10-CM

## 2022-08-24 DIAGNOSIS — D509 Iron deficiency anemia, unspecified: Secondary | ICD-10-CM | POA: Insufficient documentation

## 2022-08-24 DIAGNOSIS — D531 Other megaloblastic anemias, not elsewhere classified: Secondary | ICD-10-CM | POA: Diagnosis not present

## 2022-08-24 LAB — CBC WITH DIFFERENTIAL (CANCER CENTER ONLY)
Abs Immature Granulocytes: 0.02 10*3/uL (ref 0.00–0.07)
Basophils Absolute: 0.1 10*3/uL (ref 0.0–0.1)
Basophils Relative: 1 %
Eosinophils Absolute: 0.2 10*3/uL (ref 0.0–0.5)
Eosinophils Relative: 3 %
HCT: 39.9 % (ref 36.0–46.0)
Hemoglobin: 13.6 g/dL (ref 12.0–15.0)
Immature Granulocytes: 0 %
Lymphocytes Relative: 27 %
Lymphs Abs: 1.8 10*3/uL (ref 0.7–4.0)
MCH: 32.4 pg (ref 26.0–34.0)
MCHC: 34.1 g/dL (ref 30.0–36.0)
MCV: 95 fL (ref 80.0–100.0)
Monocytes Absolute: 0.5 10*3/uL (ref 0.1–1.0)
Monocytes Relative: 7 %
Neutro Abs: 4.3 10*3/uL (ref 1.7–7.7)
Neutrophils Relative %: 62 %
Platelet Count: 238 10*3/uL (ref 150–400)
RBC: 4.2 MIL/uL (ref 3.87–5.11)
RDW: 12.6 % (ref 11.5–15.5)
WBC Count: 6.8 10*3/uL (ref 4.0–10.5)
nRBC: 0 % (ref 0.0–0.2)

## 2022-08-24 LAB — IRON AND IRON BINDING CAPACITY (CC-WL,HP ONLY)
Iron: 108 ug/dL (ref 28–170)
Saturation Ratios: 33 % — ABNORMAL HIGH (ref 10.4–31.8)
TIBC: 329 ug/dL (ref 250–450)
UIBC: 221 ug/dL (ref 148–442)

## 2022-08-24 LAB — FERRITIN: Ferritin: 14 ng/mL (ref 11–307)

## 2022-08-24 LAB — VITAMIN B12: Vitamin B-12: 285 pg/mL (ref 180–914)

## 2022-08-24 MED ORDER — CYANOCOBALAMIN 1000 MCG/ML IJ SOLN
1000.0000 ug | INTRAMUSCULAR | Status: DC
  Administered 2022-08-24: 1000 ug via INTRAMUSCULAR
  Filled 2022-08-24: qty 1

## 2022-08-24 NOTE — Patient Instructions (Signed)
Vitamin B12 and Folate Test Why am I having this test? Vitamin B12 and folate (folic acid) are B vitamins needed to make red blood cells and keep your nervous system healthy. Vitamin B12 is in foods such as meats, eggs, dairy products, and fish. Folate is in fruits, beans, and leafy green vegetables. Some foods, such as whole grains, bread, and cereals have vitamin B12 added to them (are fortified). You may not have enough of these B vitamins (have a deficiency) if your diet lacks these vitamins. Low levels can also be caused by diseases or having had surgeries on your stomach or small intestine that interfere with your ability to absorb the vitamins from your food. You may have a vitamin B12 and folate test if: You have symptoms of vitamin B12 or folate deficiency, such as tiredness (fatigue), headache, confusion, poor balance, or tingling and numbness in your hands and feet. You are pregnant or breastfeeding. Women who are pregnant or breastfeeding need more folate and may need to take dietary supplements. Your red blood cell count is low (anemia). You are an older person and have mental confusion. You have a disease or condition that may lead to a deficiency of these B vitamins. What is being tested? This test measures the amount of vitamin B12 and folate in your blood. The tests for vitamin B12 and folate may be done together or separately. What kind of sample is taken?  A blood sample is required for this test. It is usually collected by inserting a needle into a blood vessel. How do I prepare for this test? Follow instructions from your health care provider about eating and drinking before the test. Tell a health care provider about: All medicines you are taking, including vitamins, herbs, eye drops, creams, and over-the-counter medicines. Any medical conditions you have. Whether you are pregnant or may be pregnant. How often you drink alcohol. How are the results reported? Your test  results will be reported as values that identify the amount of vitamin B12 and folate in your blood. Your health care provider will compare your results to normal ranges that were established after testing a large group of people (reference ranges). Reference ranges may vary among labs and hospitals. For this test, common reference ranges are: Vitamin B12: 160-950 pg/mL or 118-701 pmol/L (SI units). Folate: 5-25 ng/mL or 11-57 nmol/L (SI units). What do the results mean? Results within the reference range are considered normal. Vitamin B12 or folate levels that are lower than the reference range may be caused by: Poor nutrition or eating a vegetarian or vegan diet that does not include any foods that come from animals. Having alcoholism. Having certain diseases that make it hard to absorb vitamin B12. These diseases include Crohn's disease, chronic pancreatitis, and cystic fibrosis. Taking certain medicines. Having had surgeries on your stomach or small intestine. High levels of vitamin B12 are rare, but they may happen if you have: Cancer. Liver disease. High levels of folate may happen if: You have anemia. You are vegetarian. You have had a recent blood transfusion. Talk with your health care provider about what your results mean. Questions to ask your health care provider Ask your health care provider, or the department that is doing the test: When will my results be ready? How will I get my results? What are my treatment options? What other tests do I need? What are my next steps? Summary Vitamin B12 and folate (folic acid) are both B vitamins that are needed to   make red blood cells and to keep your nervous system healthy. You may not have enough B vitamins in your body if you do not get enough in your diet or if you have a disease that makes it hard to absorb vitamin B12. This test measures the amount of vitamin B12 and folate in your blood. A blood sample is required for the  test. Talk with your health care provider about what your results mean. This information is not intended to replace advice given to you by your health care provider. Make sure you discuss any questions you have with your health care provider. Document Revised: 06/04/2021 Document Reviewed: 06/04/2021 Elsevier Patient Education  2023 [employer] 

## 2022-08-24 NOTE — Progress Notes (Signed)
[facility] [phone]   [phone]  OFFICE PROGRESS NOTE  Danielle [name], [address] [place] [address]  DIAGNOSIS:  Persistent microcytic anemia secondary to iron deficiency of unclear etiology but could be secondary to malabsorption or occult gastrointestinal bleeding.  She underwent upper endoscopy and colonoscopy that were unremarkable.  PRIOR THERAPY:  1) Venofer 300 mg IV weekly for 3 weeks.  CURRENT THERAPY: Vitamin B12 injection 1000 mcg intramuscular weekly for 4 weeks and then monthly.  INTERVAL HISTORY: Danielle [name] 87 y.o. female returns to the clinic today for follow-up visit.  The patient is feeling fine today with no concerning complaints.  She denied having any current chest pain, shortness of breath, cough or hemoptysis.  She has no fatigue or weakness.  She denied having any nausea, vomiting, diarrhea or constipation.  She has no headache or visual changes.  She has no recent weight loss or night sweats.  She is here today for evaluation and repeat blood work.  She has been receiving her vitamin B12 injection at the cancer center but she checked with her primary care physician recently and she is willing to do the injection with her close to home.  MEDICAL HISTORY: Past Medical History:  Diagnosis Date   Anemia    Anxiety    Arrhythmia    Arthritis    Atrial fibrillation ([place])    Cataract    bilateral   Colon polyps    Diabetes ([place])    Diabetes mellitus without complication (HCC)    type two   GERD (gastroesophageal reflux disease)    Hypercholesteremia    Hypertension    Iron deficiency anemia    Pancreatitis    Peripheral neuropathy    Squamous cell skin cancer     ALLERGIES:  is allergic to amoxicillin, hydrochlorothiazide, cleocin [clindamycin hcl], codeine, and penicillins.  MEDICATIONS:  Current Outpatient Medications  Medication Sig Dispense Refill   Accu-Chek Softclix  Lancets lancets Use as instructed 100 each 12   allopurinol (ZYLOPRIM) 100 MG tablet Take 1 tablet (100 mg total) by mouth daily. 90 tablet 1   atorvastatin (LIPITOR) 10 MG tablet Take 1 tablet (10 mg total) by mouth daily. 90 tablet 3   Blood Glucose Monitoring Suppl (ACCU-CHEK GUIDE) w/Device KIT DxE11.65 Check BG tid 1 kit 0   calcium carbonate (OSCAL) 1500 (600 Ca) MG TABS tablet Take 600 mg of elemental calcium by mouth 2 (two) times daily with a meal.     calcium carbonate (TUMS - DOSED IN MG ELEMENTAL CALCIUM) 500 MG chewable tablet Chew 2 tablets by mouth as needed for indigestion or heartburn.     Cholecalciferol (VITAMIN D3) 1000 UNITS CAPS Take 1,000 Units by mouth in the morning and at bedtime.     colchicine 0.6 MG tablet Take 1 tablet (0.6 mg total) by mouth daily. 20 tablet 0   cyanocobalamin (,VITAMIN B-12,) 1000 MCG/ML injection Inject into the muscle.     diltiazem (DILACOR XR) 120 MG 24 hr capsule Take 1 capsule (120 mg total) by mouth daily. 90 capsule 3   gabapentin (NEURONTIN) 300 MG capsule TAKE 3 CAPSULES BY MOUTH AT BEDTIME 90 capsule 2   Glucosamine-Chondroit-Vit C-Mn (GLUCOSAMINE CHONDR 1500 COMPLX PO) Take 2 capsules by mouth in the morning and at bedtime.     glucose blood (ACCU-CHEK GUIDE) test strip Use as instructed 100 each 12   insulin glargine (LANTUS) 100 UNIT/ML injection Inject  24-26 Units into the skin at bedtime.     metFORMIN (GLUCOPHAGE) 1000 MG tablet Take 1 tablet (1,000 mg total) by mouth 2 (two) times daily. 180 tablet 1   metoprolol succinate (TOPROL-XL) 50 MG 24 hr tablet TAKE 1 TABLET BY MOUTH TWICE DAILY (MORNING  AND  BEDTIME)  WITH  OR  IMMEDIATELY  FOLLOWING  A  MEAL 180 tablet 0   omeprazole (PRILOSEC) 40 MG capsule Take 1 capsule (40 mg total) by mouth daily. 90 capsule 0   rivaroxaban (XARELTO) 20 MG TABS tablet TAKE 1 TABLET BY MOUTH ONCE DAILY WITH SUPPER 90 tablet 1   No current facility-administered medications for this visit.     SURGICAL HISTORY:  Past Surgical History:  Procedure Laterality Date   APPENDECTOMY  1957   BREAST EXCISIONAL BIOPSY Right 1966   CATARACT EXTRACTION, BILATERAL     CHOLECYSTECTOMY  10/2008   lump removed right breast  1958   bENIGN   right ankle plate  [id number]   VAGINAL HYSTERECTOMY  1978    REVIEW OF SYSTEMS:  A comprehensive review of systems was negative.   PHYSICAL EXAMINATION: General appearance: alert, cooperative, and no distress Head: Normocephalic, without obvious abnormality, atraumatic Neck: no adenopathy, no JVD, supple, symmetrical, trachea midline, and thyroid not enlarged, symmetric, no tenderness/mass/nodules Lymph nodes: Cervical, supraclavicular, and axillary nodes normal. Resp: clear to auscultation bilaterally Back: symmetric, no curvature. ROM normal. No CVA tenderness. Cardio: regular rate and rhythm, S1, S2 normal, no murmur, click, rub or gallop GI: soft, non-tender; bowel sounds normal; no masses,  no organomegaly Extremities: extremities normal, atraumatic, no cyanosis or edema  ECOG PERFORMANCE STATUS: 1 - Symptomatic but completely ambulatory  Blood pressure (!) 173/85, pulse 96, temperature 98.2 F (36.8 C), temperature source Oral, resp. rate 16, weight 171 lb 9.6 oz (77.8 kg), SpO2 100 %.  LABORATORY DATA: Lab Results  Component Value Date   WBC 6.8 08/24/2022   HGB 13.6 08/24/2022   HCT 39.9 08/24/2022   MCV 95.0 08/24/2022   PLT 238 08/24/2022      Chemistry      Component Value Date/Time   NA 141 11/26/2021 1146   NA 139 02/26/2021 1100   K 4.3 11/26/2021 1146   CL 102 11/26/2021 1146   CO2 31 11/26/2021 1146   BUN 11 11/26/2021 1146   BUN 12 02/26/2021 1100   CREATININE 0.77 11/26/2021 1146      Component Value Date/Time   CALCIUM 9.4 11/26/2021 1146   ALKPHOS 66 04/27/2021 1112   AST 14 11/26/2021 1146   AST 28 04/27/2021 1112   ALT 16 11/26/2021 1146   ALT 27 04/27/2021 1112   BILITOT 0.7 11/26/2021 1146   BILITOT  0.7 04/27/2021 1112       RADIOGRAPHIC STUDIES: No results found.  ASSESSMENT AND PLAN: This is a very pleasant 87 years old white female with history of microcytic anemia secondary to iron deficiency of unclear etiology but likely from gastrointestinal blood loss.  The patient was treated with Venofer 300 mg IV weekly for 3 weeks and tolerated her treatment well.  She was also found to have vitamin B12 deficiency and she received vitamin B12 injection weekly for 4 weeks.  She is currently on monthly vitamin B12 injection and tolerating it fairly well.   The patient has been tolerating this treatment well. Repeat CBC today showed normal hemoglobin and hematocrit.  Iron study and vitamin B12 were unremarkable.  Ferritin level still pending. I recommended  for the patient to continue with the vitamin B12 injection under the care of her primary care physician from now on. I will see her back for follow-up visit in 6 months for evaluation and repeat blood work. She was advised to call immediately if she has any other concerning symptoms in the interval. The patient voices understanding of current disease status and treatment options and is in agreement with the current care plan.  All questions were answered. The patient knows to call the clinic with any problems, questions or concerns. We can certainly see the patient much sooner if necessary.   Disclaimer: This note was dictated with voice recognition software. Similar sounding words can inadvertently be transcribed and may not be corrected upon review.

## 2022-08-26 LAB — URINE CULTURE
MICRO NUMBER:: 14125293
SPECIMEN QUALITY:: ADEQUATE

## 2022-08-26 MED ORDER — SULFAMETHOXAZOLE-TRIMETHOPRIM 800-160 MG PO TABS: 1.0000 | ORAL_TABLET | Freq: Two times a day (BID) | ORAL | 0 refills | Status: AC

## 2022-08-26 NOTE — Addendum Note (Signed)
Addended by: [name] on: 08/26/2022 12:00 PM   Modules accepted: Orders

## 2022-08-30 ENCOUNTER — Encounter: Payer: Self-pay | Admitting: Internal Medicine

## 2022-08-30 ENCOUNTER — Ambulatory Visit (INDEPENDENT_AMBULATORY_CARE_PROVIDER_SITE_OTHER): Payer: Medicare Other | Admitting: Internal Medicine

## 2022-08-30 VITALS — BP 118/78 | HR 112 | Temp 98.2°F | Resp 16 | Ht 64.0 in | Wt 168.9 lb

## 2022-08-30 DIAGNOSIS — D531 Other megaloblastic anemias, not elsewhere classified: Secondary | ICD-10-CM

## 2022-08-30 DIAGNOSIS — E1165 Type 2 diabetes mellitus with hyperglycemia: Secondary | ICD-10-CM | POA: Diagnosis not present

## 2022-08-30 DIAGNOSIS — K219 Gastro-esophageal reflux disease without esophagitis: Secondary | ICD-10-CM

## 2022-08-30 DIAGNOSIS — I4821 Permanent atrial fibrillation: Secondary | ICD-10-CM

## 2022-08-30 DIAGNOSIS — I10 Essential (primary) hypertension: Secondary | ICD-10-CM | POA: Diagnosis not present

## 2022-08-30 DIAGNOSIS — Z794 Long term (current) use of insulin: Secondary | ICD-10-CM | POA: Diagnosis not present

## 2022-08-30 DIAGNOSIS — E785 Hyperlipidemia, unspecified: Secondary | ICD-10-CM

## 2022-08-30 LAB — POCT GLYCOSYLATED HEMOGLOBIN (HGB A1C): Hemoglobin A1C: 7.5 % — AB (ref 4.0–5.6)

## 2022-08-30 MED ORDER — METFORMIN HCL 1000 MG PO TABS: 1000.0000 mg | ORAL_TABLET | Freq: Two times a day (BID) | ORAL | 1 refills | Status: DC

## 2022-08-30 MED ORDER — OMEPRAZOLE 40 MG PO CPDR: 40.0000 mg | DELAYED_RELEASE_CAPSULE | Freq: Every day | ORAL | 0 refills | Status: DC

## 2022-08-30 NOTE — Patient Instructions (Addendum)
It was great seeing you today!  Plan discussed at today's visit: -Medications refilled -A1c 7.5% today  Follow up in: 3 months  Take care and let us know if you have any questions or concerns prior to your next visit.  Dr. Rosana Berger

## 2022-08-30 NOTE — Progress Notes (Signed)
Established Patient Office Visit  Subjective:  Patient ID: Danielle [name], female    DOB: 1935/01/10  Age: 87 y.o. MRN: [id number]  CC:  Chief Complaint  Patient presents with   Follow-up   Diabetes    HPI [name] here for follow up on chronic medical conditions. Was seen in the office last week for bleeding, patient ended up having RBC in urine and found to have UTI. She was treated with Bactrim which she finished. Also found to have bleeding hemorrhoids on exam. Today she states that she finished the antibiotic without issues. She has not had any more bleeding since her visit here. She has started using the preparation H which has helped hte hemorrhoids.   Hypertension/A.Fib: -Medications: Diltiazem 120, Metoprolol 50, Xarelto 20 -Follows with Cardiology, Dr. Audie Box last seen on 12/06/21 -Patient is compliant with above medications and reports no side effects. -Checking BP at home (average): doesn't check  -Denies any SOB, CP, vision changes, LE edema or symptoms of hypotension.  Does have some mild fatigue.  HLD: -Medications: Lipitor 10 mg -Patient is compliant with above medications and reports no side effects.  -Last lipid panel: 10/2008 Lipid Panel     Component Value Date/Time   CHOL 108 11/26/2021 1146   TRIG 203 (H) 11/26/2021 1146   HDL 44 (L) 11/26/2021 1146   CHOLHDL 2.5 11/26/2021 1146   VLDL 45 (H) 11/10/2008 0317   LDLCALC 37 11/26/2021 1146    Diabetes, Type 2 with Neuropathy: -Last A1c 7.3% 2/23 -Medications: Metformin 1000 BID, Lantus 24-26 units at night, Gabapentin 300 mg TID -Patient is compliant with the above medications and reports no side effects.  -Checking BG at home: fasting 100-149 -Eye exam: UTD 9/23 - planning on redoing cataracts soon  -Foot exam: Follows with Podiatry, Dr. Jens Som every 3 months  -Microalbumin: UTD 2/23 -Statin: yes -PNA vaccine: yes -Denies symptoms of hypoglycemia, polyuria, polydipsia, numbness extremities, foot  ulcers/trauma.   Iron Deficiency Anemia/Vitamin B12 deficiency: -Had been on IV iron in the past, but not on any more -Just saw Hematology last week for labs -Will start following here for IM Vitamin B12 injection every 4 weeks -last injection 08/24/2022  GERD: -Currently on Prilosec 40, doing well  Sensorineural Hearing Loss in left ear due to COVID: -Following with [place] [facility], note from 01/25/22 reviewed, plan on repeating hearing test and continuing hearing aids. Has an appointment on 5/16 for an MRI brain   Gout: -Last flare in March of this year, treated with Medrol Dosepak and Colchicine, not on Allopurinol currently -Has had a total of 3 flares but last time was the worse, located in the great toe  Health maintenance: -Blood work UTD -Mammogram 3/23, Birads-1 -Colonoscopy 6/22, repeat every 5 years   Past Medical History:  Diagnosis Date   Anemia    Anxiety    Arrhythmia    Arthritis    Atrial fibrillation ([place])    Cataract    bilateral   Colon polyps    Diabetes ([name])    Diabetes mellitus without complication ([name])    type two   GERD (gastroesophageal reflux disease)    Hypercholesteremia    Hypertension    Iron deficiency anemia    Pancreatitis    Peripheral neuropathy    Squamous cell skin cancer     Past Surgical History:  Procedure Laterality Date   APPENDECTOMY  1957   BREAST EXCISIONAL BIOPSY Right 1966   CATARACT  EXTRACTION, BILATERAL     CHOLECYSTECTOMY  10/2008   lump removed right breast  1958   bENIGN   right ankle plate  [id number]   VAGINAL HYSTERECTOMY  1978    Family History  Problem Relation Age of Onset   Hypertension Mother    Stroke Mother    Diabetes Mother    Colon cancer Mother 65   Cerebral aneurysm Father    Colon cancer Sister        in her 57s   CAD Sister    Heart attack Sister    Uterine cancer Sister        in her late 26s   Colon cancer Sister        in her 52s   Parkinsonism Sister     Dementia Sister    Heart attack Brother    Heart attack Brother    Throat cancer Brother    Atrial fibrillation Brother     Social History   Socioeconomic History   Marital status: Widowed    Spouse name: Not on file   Number of children: 3   Years of education: Not on file   Highest education level: Not on file  Occupational History   Not on file  Tobacco Use   Smoking status: Never   Smokeless tobacco: Never  Vaping Use   Vaping Use: Never used  Substance and Sexual Activity   Alcohol use: No    Alcohol/week: 0.0 standard drinks of alcohol   Drug use: No   Sexual activity: Not Currently  Other Topics Concern   Not on file  Social History Narrative   Not on file   Social Determinants of Health   Financial Resource Strain: Not on file  Food Insecurity: Not on file  Transportation Needs: Not on file  Physical Activity: Not on file  Stress: Not on file  Social Connections: Not on file  Intimate Partner Violence: Not on file    ROS Review of Systems  Constitutional:  Negative for chills and fever.  HENT:  Positive for hearing loss.   Eyes:  Negative for visual disturbance.  Respiratory:  Negative for cough and shortness of breath.   Cardiovascular:  Negative for chest pain, palpitations and leg swelling.  Gastrointestinal:  Negative for abdominal pain, nausea and vomiting.  Neurological:  Negative for dizziness and headaches.    Objective:   Today's Vitals: BP 118/78   Pulse (!) 112   Temp 98.2 F (36.8 C)   Resp 16   Ht '5\' 4"'$  (1.626 m)   Wt 168 lb 14.4 oz (76.6 kg)   SpO2 97%   BMI 28.99 kg/m   Physical Exam Constitutional:      Appearance: Normal appearance.  HENT:     Head: Normocephalic and atraumatic.  Eyes:     Conjunctiva/sclera: Conjunctivae normal.  Cardiovascular:     Rate and Rhythm: Normal rate. Rhythm irregular.  Pulmonary:     Effort: Pulmonary effort is normal.     Breath sounds: Normal breath sounds.  Musculoskeletal:      Right lower leg: No edema.     Left lower leg: No edema.  Skin:    General: Skin is warm and dry.  Neurological:     General: No focal deficit present.     Mental Status: She is alert. Mental status is at baseline.  Psychiatric:        Mood and Affect: Mood normal.        Behavior:  Behavior normal.     Assessment & Plan:   1. Type 2 diabetes mellitus with hyperglycemia, with long-term current use of insulin ([place]): A1c in the office increased slightly at 7.5%.  Fasting sugars at home appropriate, will continue current regiment of metformin at 1000 mg twice daily, Lantus 24 to 26 units daily.  Patient will work on her diet to decrease carbs and sugar.  Follow-up here in 3 months to recheck A1c.  Foot exam done today as well.  - POCT HgB A1C - [facility] Diabetes Foot Exam - metFORMIN (GLUCOPHAGE) 1000 MG tablet; Take 1 tablet (1,000 mg total) by mouth 2 (two) times daily.  Dispense: 180 tablet; Refill: 1  2. Essential hypertension/Permanent atrial fibrillation [facility]): Chronic and stable.  Blood pressure at goal here today.  Medications reviewed, no changes to current regimen made today.  Continue diltiazem 120 mg, metoprolol 50 mg and Xarelto 20 mg daily.  Planning on following up with cardiology in March.  3. Hyperlipidemia, unspecified hyperlipidemia type: Chronic and stable.  Continue Lipitor 10 mg daily.  4. Gastroesophageal reflux disease, unspecified whether esophagitis present: Stable.  Refill Prilosec 40 mg daily.  - omeprazole (PRILOSEC) 40 MG capsule; Take 1 capsule (40 mg total) by mouth daily.  Dispense: 90 capsule; Refill: 0  5. Vitamin B12 deficient megaloblastic anemia: Following with hematology, labs reviewed from 08/24/2022.  Patient will follow-up here on a monthly basis for IM vitamin B12 injections.  She will return in 1 month for her next injection.  She has plans to follow-up with hematology in 3 months to recheck labs.   Follow-up: Return in 3 months (on 11/30/2022) for  medical follow up, 1 month nurse's visit for vitamin b12.   [name], DO

## 2022-09-01 ENCOUNTER — Ambulatory Visit: Payer: Medicare Other | Admitting: Internal Medicine

## 2022-09-01 ENCOUNTER — Other Ambulatory Visit: Payer: Medicare Other

## 2022-09-01 ENCOUNTER — Ambulatory Visit: Payer: Medicare Other

## 2022-09-08 ENCOUNTER — Ambulatory Visit (INDEPENDENT_AMBULATORY_CARE_PROVIDER_SITE_OTHER): Payer: Medicare Other

## 2022-09-08 DIAGNOSIS — Z0000 Encounter for general adult medical examination without abnormal findings: Secondary | ICD-10-CM

## 2022-09-08 NOTE — Progress Notes (Signed)
I connected with  Danielle [name] on 09/08/22 by a audio enabled telemedicine application and verified that I am speaking with the correct person using two identifiers.  Patient Location: Home  Provider Location: Office/Clinic  I discussed the limitations of evaluation and management by telemedicine. The patient expressed understanding and agreed to proceed.   Subjective:   Danielle [name] is a 87 y.o. female who presents for Medicare Annual (Subsequent) preventive examination.  Review of Systems    Per HPI unless specifically indicated below.  Cardiac Risk Factors include: advanced age (>52mn, >>50women);female gender          Objective:       08/30/2022    9:01 AM 08/24/2022   11:15 AM 08/23/2022    1:27 PM  Vitals with BMI  Height _0   _1   Weight 168 lbs 14 oz 171 lbs 10 oz 173 lbs 10 oz  BMI 28.98 [phone] Systolic [id number] Diastolic 78 85 64  Pulse [id number] 89    There were no vitals filed for this visit. There is no height or weight on file to calculate BMI.     09/08/2022    3:09 PM 06/08/2021    9:44 AM 05/04/2015    3:45 PM  Advanced Directives  Does Patient Have a Medical Advance Directive? No No Yes  Type of AScientist, physiologicalof [place] will  Would patient like information on creating a medical advance directive? No - Patient declined No - Patient declined     Current Medications (verified) Outpatient Encounter Medications as of 09/08/2022  Medication Sig   Accu-Chek Softclix Lancets lancets Use as instructed   allopurinol (ZYLOPRIM) 100 MG tablet Take 1 tablet (100 mg total) by mouth daily. (Patient taking differently: Take 100 mg by mouth as needed.)   atorvastatin (LIPITOR) 10 MG tablet Take 1 tablet (10 mg total) by mouth daily.   Blood Glucose Monitoring Suppl (ACCU-CHEK GUIDE) w/Device KIT DxE11.65 Check BG tid   calcium carbonate (OSCAL) 1500 (600 Ca) MG TABS tablet Take 600 mg of elemental calcium by mouth 2  (two) times daily with a meal.   Cholecalciferol (VITAMIN D3) 1000 UNITS CAPS Take 1,000 Units by mouth in the morning and at bedtime.   colchicine 0.6 MG tablet Take 1 tablet (0.6 mg total) by mouth daily. (Patient taking differently: Take 0.6 mg by mouth as needed.)   cyanocobalamin (,VITAMIN B-12,) 1000 MCG/ML injection Inject into the muscle.   diltiazem (DILACOR XR) 120 MG 24 hr capsule Take 1 capsule (120 mg total) by mouth daily.   gabapentin (NEURONTIN) 300 MG capsule TAKE 3 CAPSULES BY MOUTH AT BEDTIME   Glucosamine-Chondroit-Vit C-Mn (GLUCOSAMINE CHONDR 1500 COMPLX PO) Take 1 capsule by mouth in the morning and at bedtime.   glucose blood (ACCU-CHEK GUIDE) test strip Use as instructed   insulin glargine (LANTUS) 100 UNIT/ML injection Inject 24-26 Units into the skin at bedtime.   metFORMIN (GLUCOPHAGE) 1000 MG tablet Take 1 tablet (1,000 mg total) by mouth 2 (two) times daily.   metoprolol succinate (TOPROL-XL) 50 MG 24 hr tablet TAKE 1 TABLET BY MOUTH TWICE DAILY (MORNING  AND  BEDTIME)  WITH  OR  IMMEDIATELY  FOLLOWING  A  MEAL   omeprazole (PRILOSEC) 40 MG capsule Take 1 capsule (40 mg total) by mouth daily.   rivaroxaban (XARELTO) 20 MG TABS tablet TAKE 1 TABLET BY MOUTH ONCE DAILY WITH SUPPER  calcium carbonate (TUMS - DOSED IN MG ELEMENTAL CALCIUM) 500 MG chewable tablet Chew 2 tablets by mouth as needed for indigestion or heartburn. (Patient not taking: Reported on 09/08/2022)   No facility-administered encounter medications on file as of 09/08/2022.    Allergies (verified) Amoxicillin, Hydrochlorothiazide, Cleocin [clindamycin hcl], Codeine, and Penicillins   History: Past Medical History:  Diagnosis Date   Anemia    Anxiety    Arrhythmia    Arthritis    Atrial fibrillation (HCC)    Cataract    bilateral   Colon polyps    Diabetes ([place])    Diabetes mellitus without complication (HCC)    type two   GERD (gastroesophageal reflux disease)    Hypercholesteremia     Hypertension    Iron deficiency anemia    Pancreatitis    Peripheral neuropathy    Squamous cell skin cancer    Past Surgical History:  Procedure Laterality Date   APPENDECTOMY  1957   BREAST EXCISIONAL BIOPSY Right 1966   CATARACT EXTRACTION, BILATERAL     CHOLECYSTECTOMY  10/2008   lump removed right breast  1958   bENIGN   right ankle plate  2683   VAGINAL HYSTERECTOMY  1978   Family History  Problem Relation Age of Onset   Hypertension Mother    Stroke Mother    Diabetes Mother    Colon cancer Mother 29   Cerebral aneurysm Father    Colon cancer Sister        in her 66s   CAD Sister    Heart attack Sister    Uterine cancer Sister        in her late 34s   Colon cancer Sister        in her 83s   Parkinsonism Sister    Dementia Sister    Heart attack Brother    Heart attack Brother    Throat cancer Brother    Atrial fibrillation Brother    Social History   Socioeconomic History   Marital status: Widowed    Spouse name: Not on file   Number of children: 3   Years of education: Not on file   Highest education level: Not on file  Occupational History   Occupation: Retired  Tobacco Use   Smoking status: Never   Smokeless tobacco: Never  Vaping Use   Vaping Use: Never used  Substance and Sexual Activity   Alcohol use: No    Alcohol/week: 0.0 standard drinks of alcohol   Drug use: No   Sexual activity: Not Currently  Other Topics Concern   Not on file  Social History Narrative   Not on file   Social Determinants of Health   Financial Resource Strain: Low Risk  (09/08/2022)   Overall Financial Resource Strain (CARDIA)    Difficulty of Paying Living Expenses: Not very hard  Food Insecurity: No Food Insecurity (09/08/2022)   Hunger Vital Sign    Worried About Running Out of Food in the Last Year: Never true    [place] in the Last Year: Never true  Transportation Needs: No Transportation Needs (09/08/2022)   PRAPARE - Radiographer, therapeutic (Medical): No    Lack of Transportation (Non-Medical): No  Physical Activity: Inactive (09/08/2022)   Exercise Vital Sign    Days of Exercise per Week: 0 days    Minutes of Exercise per Session: 0 min  Stress: No Stress Concern Present (09/08/2022)   [employer]  of Occupational Health - Occupational Stress Questionnaire    [name] : Only a little  Social Connections: Moderately Integrated (09/08/2022)   Social Connection and Isolation Panel [NHANES]    Frequency of Communication with Friends and Family: More than three times a week    Frequency of Social Gatherings with Friends and Family: More than three times a week    Attends Religious Services: More than 4 times per year    Active Member of [employer] or Organizations: Yes    Attends Archivist Meetings: More than 4 times per year    Marital Status: Widowed    Tobacco Counseling Counseling given: No   Clinical Intake:  Pre-visit preparation completed: No  Pain : No/denies pain     Nutritional Status: BMI of 19-24  Normal Nutritional Risks: Nausea/ vomitting/ diarrhea Diabetes: Yes CBG done?: No Did pt. bring in CBG monitor from home?: No  How often do you need to have someone help you when you read instructions, pamphlets, or other written materials from your doctor or pharmacy?: 1 - Never  Diabetic?Nutrition Risk Assessment:  Has the patient had any N/V/D within the last 2 months?  No  Does the patient have any non-healing wounds?  No  Has the patient had any unintentional weight loss or weight gain?  No   Diabetes:  Is the patient diabetic?  Yes  If diabetic, was a CBG obtained today?  No  Did the patient bring in their glucometer from home?  No  How often do you monitor your CBG's? Every other day.   Financial Strains and Diabetes Management:  Are you having any financial strains with the device, your supplies or your medication? No .  Does the patient want to be seen  by Chronic Care Management for management of their diabetes?  No  Would the patient like to be referred to a Nutritionist or for Diabetic Management?  No   Diabetic Exams:  Diabetic Eye Exam: Completed 08/11/2022 Diabetic Foot Exam: Completed 08/30/2022    Interpreter Needed?: No  Information entered by :: Donnie Mesa, [place]   Activities of Daily Living    09/08/2022    1:08 PM 08/30/2022    9:09 AM  In your present state of health, do you have any difficulty performing the following activities:  Hearing? 1 1  Comment hearing aids   Vision? 1 0  Comment blurry vision, [facility]   Difficulty concentrating or making decisions? 1 0  Walking or climbing stairs? 1 1  Dressing or bathing? 0 0  Doing errands, shopping? 0 0    Patient Care Team: [name], DO as PCP - General (Internal Medicine)  Indicate any recent Medical Services you may have received from other than [facility] providers in the past year (date may be approximate).    No hospitalizations in the past 12 months.  Assessment:   This is a routine wellness examination for Danielle [name].  Hearing/Vision screen Difficulty hearing: wear bilateral hearing aids Annual Eye Exam, wear glasses   Dietary issues and exercise activities discussed: Current Exercise Habits: The patient does not participate in regular exercise at present, Exercise limited by: orthopedic condition(s)   Goals Addressed   None    Depression Screen    08/30/2022    9:09 AM 08/23/2022    1:37 PM 02/24/2022    2:21 PM 01/07/2022    9:11 AM 01/04/2022    2:58 PM 11/26/2021   10:53 AM  PHQ 2/9 Scores  PHQ - 2 Score 1 0 0 0 0 0  PHQ- 9 Score 1 0 0 0 0 0    Fall Risk    09/08/2022    1:07 PM 08/30/2022    9:09 AM 08/23/2022    1:37 PM 02/24/2022    2:19 PM 01/07/2022    9:11 AM  Fall Risk   Falls in the past year? 0 0 0 0 0  Number falls in past yr: 0 0 0 0 0  Injury with Fall? 0 0 0 0 0  Risk for fall due to : No Fall Risks       Follow up Falls evaluation completed        FALL RISK PREVENTION PERTAINING TO THE HOME:  Any stairs in or around the home? Yes  If so, are there any without handrails? Yes  Home free of loose throw rugs in walkways, pet beds, electrical cords, etc? Yes  Adequate lighting in your home to reduce risk of falls? Yes   ASSISTIVE DEVICES UTILIZED TO PREVENT FALLS:  Life alert? No  Use of a cane, walker or w/c? No  Grab bars in the bathroom? Yes  Shower chair or bench in shower? No  Elevated toilet seat or a handicapped toilet? No   TIMED UP AND GO:  Was the test performed? No .    Cognitive Function:        09/08/2022    1:10 PM  6CIT Screen  What Year? 0 points  What month? 0 points  What time? 0 points  Count back from 20 0 points  Months in reverse 0 points  Repeat phrase 0 points  Total Score 0 points    Immunizations Immunization History  Administered Date(s) Administered   Fluad Quad(high Dose 65+) 08/23/2022   Influenza Split 09/29/2007, 10/22/2008, 06/30/2009, 07/27/2011, 10/09/2013   Influenza, High Dose Seasonal PF 08/30/2021   Influenza,inj,Quad PF,6+ Mos 08/03/2016, 08/07/2017, 08/22/2018, 08/27/2019, 08/18/2020   Influenza,inj,quad, With Preservative 07/22/2015   PFIZER(Purple Top)SARS-COV-2 Vaccination 01/24/2020, 02/14/2020   Pneumococcal Conjugate-13 01/03/2014   Pneumococcal Polysaccharide-23 05/10/2005   Td 10/07/2002   Tdap 11/16/2011    TDAP status: Up to date  Flu Vaccine status: Up to date  Pneumococcal vaccine status: Up to date  Covid-19 vaccine status: Information provided on how to obtain vaccines.   Qualifies for Shingles Vaccine? Yes   Zostavax completed No   Shingrix Completed?: No.    Education has been provided regarding the importance of this vaccine. Patient has been advised to call insurance company to determine out of pocket expense if they have not yet received this vaccine. Advised may also receive vaccine at local  pharmacy or Health Dept. Verbalized acceptance and understanding.  Screening Tests Health Maintenance  Topic Date Due   COVID-19 Vaccine (3 - Pfizer risk series) 09/15/2022 (Originally 03/13/2020)   Zoster Vaccines- Shingrix (1 of 2) 11/30/2022 (Originally 03/26/1954)   Diabetic kidney evaluation - GFR measurement  11/26/2022   Diabetic kidney evaluation - Urine ACR  11/26/2022   HEMOGLOBIN A1C  02/28/2023   OPHTHALMOLOGY EXAM  08/12/2023   FOOT EXAM  08/31/2023   Medicare Annual Wellness (AWV)  09/09/2023   COLONOSCOPY (Pts 45-44yr Insurance coverage will need to be confirmed)  04/22/2026   Pneumonia Vaccine 85 Years old  Completed   INFLUENZA VACCINE  Completed   DEXA SCAN  Completed   HPV VACCINES  Aged Out   TETANUS/TDAP  Discontinued    Health Maintenance  There are no  preventive care reminders to display for this patient.   Colorectal cancer screening: No longer required.   Mammogram status: No longer required due to age.  DEXA Scan: completed 06/16/2020  Lung Cancer Screening: (Low Dose CT Chest recommended if Age 63-80 years, 30 pack-year currently smoking OR have quit w/in 15years.) does not qualify.   Lung   Additional Screening:  Hepatitis C Screening: does not qualify  Vision Screening: Recommended annual ophthalmology exams for early detection of glaucoma and other disorders of the eye. Is the patient up to date with their annual eye exam?  Yes  Who is the provider or what is the name of the office in which the patient attends annual eye exams? [facility]  If pt is not established with a provider, would they like to be referred to a provider to establish care? No .   Dental Screening: Recommended annual dental exams for proper oral hygiene  Community Resource Referral / Chronic Care Management: CRR required this visit?  No   CCM required this visit?  No      Plan:     I have personally reviewed and noted the following in the patient's  chart:   Medical and social history Use of alcohol, tobacco or illicit drugs  Current medications and supplements including opioid prescriptions. Patient is not currently taking opioid prescriptions. Functional ability and status Nutritional status Physical activity Advanced directives List of other physicians Hospitalizations, surgeries, and ER visits in previous 12 months Vitals Screenings to include cognitive, depression, and falls Referrals and appointments  In addition, I have reviewed and discussed with patient certain preventive protocols, quality metrics, and best practice recommendations. A written personalized care plan for preventive services as well as general preventive health recommendations were provided to patient.     Danielle [name] , Thank you for taking time to come for your Medicare Wellness Visit. I appreciate your ongoing commitment to your health goals. Please review the following plan we discussed and let me know if I can assist you in the future.   These are the goals we discussed:  Goals   None     This is a list of the screening recommended for you and due dates:  Health Maintenance  Topic Date Due   COVID-19 Vaccine (3 - Pfizer risk series) 09/15/2022*   Zoster (Shingles) Vaccine (1 of 2) 11/30/2022*   Yearly kidney function blood test for diabetes  11/26/2022   Yearly kidney health urinalysis for diabetes  11/26/2022   Hemoglobin A1C  02/28/2023   Eye exam for diabetics  08/12/2023   Complete foot exam   08/31/2023   Medicare Annual Wellness Visit  09/09/2023   Colon Cancer Screening  04/22/2026   Pneumonia Vaccine  Completed   Flu Shot  Completed   DEXA scan (bone density measurement)  Completed   HPV Vaccine  Aged Out   Tetanus Vaccine  Discontinued  *Topic was postponed. The date shown is not the original due date.    Wilson Singer, [place]   09/08/2022   Nurse Notes: Approximately 30 minute Non-Face-To-Face Visit

## 2022-09-08 NOTE — Patient Instructions (Signed)

## 2022-09-09 ENCOUNTER — Telehealth: Payer: Self-pay | Admitting: Cardiovascular Disease

## 2022-09-09 NOTE — Telephone Encounter (Signed)
Patient states she will bring a form into the office. She states she needs Dr Audie Box sign the forms before 09/22/22.  Patient states her insurance agent is helping her with exception to get medication for next year,but she states the information has t be there no later than the 30 th of this month. Patient is aware the doctor will not be in the office until Sep 26, 2022  Patient wanted to know if someone else can sign for him  RN informed patient  not sure  but to bring the papers in  as soon as she can.

## 2022-09-09 NOTE — Telephone Encounter (Signed)
Patient called stating she has forms he needs signed by Dr. Audie Box that has to be signed by 09/22/22.  She wants to know when would be the best time to drop them off at the office. Please advise.

## 2022-09-13 NOTE — Telephone Encounter (Signed)
Called patient- advised of message below. She has not received the forms as of yet, but will bring them to the office today if she gets them, or tomorrow.   Patient aware I will look to see if forms can be stamped, if not I will call Dr.O'Neal and plan a time for him to stop by.   Patient agreeable with this plan.   She is due for follow up- I scheduled for March 2024. Patient verbalized understanding. Thankful for call back.

## 2022-09-14 NOTE — Telephone Encounter (Signed)
Noted! Thank you

## 2022-09-14 NOTE — Telephone Encounter (Signed)
  Pt called back, she just checked her mail and she still hasn't receive the form yet. She said, she will continue to check everyday, she might receive it after thanksgiving. But once she received it she will let Danielle [name] know and will bring the forms to the office as soon as possible

## 2022-09-20 ENCOUNTER — Telehealth: Payer: Self-pay | Admitting: Cardiovascular Disease

## 2022-09-20 NOTE — Telephone Encounter (Signed)
Patient stated her insurance company contacted [facility] regarding Xarelto. She said the pharmaceutical will contact our clinic for the order. Three sample bottles set aside for patient Lot #: [id number]; Exp. 10/25. Approved by PharmD Cyril Mourning.

## 2022-09-20 NOTE — Telephone Encounter (Signed)
Phone went dead on 2 attempts.

## 2022-09-20 NOTE — Telephone Encounter (Signed)
New Message:    Patient called. She wanted Dr Kathalene Frames nurse to know that she no longer need the paper work for her [place]. She said her insurance company was taking care of that. She said the pharmaceutical company will contact her in the next 2 or 3 days for a prescription for her Xarelto. That will be all that they will need at this time.

## 2022-09-22 ENCOUNTER — Telehealth: Payer: Self-pay | Admitting: Cardiovascular Disease

## 2022-09-22 DIAGNOSIS — I4821 Permanent atrial fibrillation: Secondary | ICD-10-CM

## 2022-09-22 NOTE — Telephone Encounter (Signed)
Pt c/o medication issue:  1. Name of Medication: rivaroxaban (XARELTO) 20 MG TABS tablet   2. How are you currently taking this medication (dosage and times per day)?   3. Are you having a reaction (difficulty breathing--STAT)?   4. What is your medication issue? [facility] states they need prescription faxed over to number below    Fax: [phone]

## 2022-09-23 MED ORDER — RIVAROXABAN 20 MG PO TABS: 20.0000 mg | ORAL_TABLET | Freq: Every day | ORAL | 1 refills | Status: DC

## 2022-09-23 NOTE — Telephone Encounter (Signed)
Prescription refill request for Xarelto received.  Indication: Afib  Last office visit: 12/06/21 (O'[facility])  Weight: 76.6kg Age: 87 Scr: 0.77 (11/26/21) CrCl: 64.51m/min  Appropriate dose and refill sent to requested pharmacy.

## 2022-09-29 ENCOUNTER — Ambulatory Visit (INDEPENDENT_AMBULATORY_CARE_PROVIDER_SITE_OTHER): Payer: Medicare Other

## 2022-09-29 DIAGNOSIS — D531 Other megaloblastic anemias, not elsewhere classified: Secondary | ICD-10-CM | POA: Diagnosis not present

## 2022-09-29 MED ORDER — CYANOCOBALAMIN 1000 MCG/ML IJ SOLN
1000.0000 ug | Freq: Once | INTRAMUSCULAR | Status: AC
  Administered 2022-09-29: 1000 ug via INTRAMUSCULAR

## 2022-10-11 ENCOUNTER — Other Ambulatory Visit: Payer: Self-pay | Admitting: Internal Medicine

## 2022-10-11 NOTE — Telephone Encounter (Signed)
Requested medication (s) are due for refill today: yes  Requested medication (s) are on the active medication list: historical med   Last refill:  11/28/2013  Future visit scheduled: yes  Notes to clinic:  historical provider   Requested Prescriptions  Pending Prescriptions Disp Refills   insulin glargine (LANTUS) 100 UNIT/ML injection 10 mL     Sig: Inject 0.24-0.26 mLs (24-26 Units total) into the skin at bedtime.     Endocrinology:  Diabetes - Insulins Passed - 10/11/2022  9:53 AM      Passed - HBA1C is between 0 and 7.9 and within 180 days    Hemoglobin A1C  Date Value Ref Range Status  08/30/2022 7.5 (A) 4.0 - 5.6 % Final   Hgb A1c MFr Bld  Date Value Ref Range Status  11/26/2021 7.3 (H) <5.7 % of total Hgb Final    Comment:    For someone without known diabetes, a hemoglobin A1c value of 6.5% or greater indicates that they may have  diabetes and this should be confirmed with a follow-up  test. . For someone with known diabetes, a value <7% indicates  that their diabetes is well controlled and a value  greater than or equal to 7% indicates suboptimal  control. A1c targets should be individualized based on  duration of diabetes, age, comorbid conditions, and  other considerations. . Currently, no consensus exists regarding use of hemoglobin A1c for diagnosis of diabetes for children. Renella Cunas - Valid encounter within last 6 months    Recent Outpatient Visits           1 month ago Type 2 diabetes mellitus with hyperglycemia, with long-term current use of insulin [facility])   [facility] [name], DO   1 month ago Bleeding external hemorrhoids   [facility] [name], DO   7 months ago History of gout   [facility] [name], DO   9 months ago Acute gout involving toe of right foot, unspecified cause   [facility] [name], DO   9 months  ago Acute gout involving toe of right foot, unspecified cause   [facility] [name], DO       Future Appointments             In 1 month [name], [facility], [place]   In 2 months [place], Cassie Freer, MD [place]. [place]

## 2022-10-11 NOTE — Telephone Encounter (Signed)
Medication Refill - Medication: Lantus  100 unit ml   Has the patient contacted their pharmacy? No. She use to get this from Dr Lindell Noe (Agent: If no, request that the patient contact the pharmacy for the refill. If patient does not wish to contact the pharmacy document the reason why and proceed with request.) (Agent: If yes, when and what did the pharmacy advise?)  Preferred Pharmacy (with phone number or street name): [facility] Has the patient been seen for an appointment in the last year OR does the patient have an upcoming appointment? Yes.    Agent: Please be advised that RX refills may take up to 3 business days. We ask that you follow-up with your pharmacy.

## 2022-10-12 MED ORDER — INSULIN GLARGINE 100 UNIT/ML ~~LOC~~ SOLN: 24.0000 [IU] | Freq: Every day | SUBCUTANEOUS | 1 refills | Status: DC

## 2022-10-19 ENCOUNTER — Other Ambulatory Visit: Payer: Self-pay | Admitting: Cardiovascular Disease

## 2022-10-27 NOTE — Progress Notes (Deleted)
   Acute Office Visit  Subjective:     Patient ID: Danielle [name], female    DOB: 19-Jan-1935, 88 y.o.   MRN: [id number]  No chief complaint on file.   HPI Patient is in today for ear pain.  EAR PAIN Duration: {Blank single:19197::"days","weeks","months"} Involved ear(s): {Blank single:19197::"left","right","bilateral"} Severity:  {Blank single:19197::"mild","moderate","severe","1/10","2/10","3/10","4/10","5/10","6/10","7/10","8/10","9/10","10/10"}  Quality:  {Blank multiple:19196::"sharp","dull","aching","burning","cramping","ill-defined","itchy","pressure-like","pulling","shooting","sore","stabbing","tender","tearing","throbbing"} Fever: {Blank single:19197::"yes","no"} Otorrhea: {Blank single:19197::"yes","no"} Upper respiratory infection symptoms: {Blank single:19197::"yes","no"} Pruritus: {Blank single:19197::"yes","no"} Hearing loss: {Blank single:19197::"yes","no"} Water immersion {Blank single:19197::"yes","no"} Using Q-tips: {Blank single:19197::"yes","no"} Recurrent otitis media: {Blank single:19197::"yes","no"} Status: {Blank multiple:19196::"better","worse","stable","fluctuating"} Treatments attempted: {Blank single:19197::"none","pseudoephedrine"}   ROS      Objective:    There were no vitals taken for this visit. {Vitals History (Optional):23777}  Physical Exam  No results found for any visits on 10/28/22.      Assessment & Plan:   Problem List Items Addressed This Visit   None   No orders of the defined types were placed in this encounter.   No follow-ups on file.  [name], DO

## 2022-10-28 ENCOUNTER — Ambulatory Visit: Payer: Medicare Other | Admitting: Internal Medicine

## 2022-11-03 ENCOUNTER — Ambulatory Visit: Payer: Medicare Other

## 2022-11-03 ENCOUNTER — Ambulatory Visit (INDEPENDENT_AMBULATORY_CARE_PROVIDER_SITE_OTHER): Payer: Medicare Other

## 2022-11-03 DIAGNOSIS — D531 Other megaloblastic anemias, not elsewhere classified: Secondary | ICD-10-CM | POA: Diagnosis not present

## 2022-11-03 DIAGNOSIS — B351 Tinea unguium: Secondary | ICD-10-CM | POA: Diagnosis not present

## 2022-11-03 DIAGNOSIS — E1140 Type 2 diabetes mellitus with diabetic neuropathy, unspecified: Secondary | ICD-10-CM | POA: Diagnosis not present

## 2022-11-03 DIAGNOSIS — Z794 Long term (current) use of insulin: Secondary | ICD-10-CM | POA: Diagnosis not present

## 2022-11-03 DIAGNOSIS — L851 Acquired keratosis [keratoderma] palmaris et plantaris: Secondary | ICD-10-CM | POA: Diagnosis not present

## 2022-11-03 MED ORDER — CYANOCOBALAMIN 1000 MCG/ML IJ SOLN
1000.0000 ug | Freq: Once | INTRAMUSCULAR | Status: AC
  Administered 2022-11-03: 1000 ug via INTRAMUSCULAR

## 2022-11-08 ENCOUNTER — Telehealth: Payer: Self-pay | Admitting: Internal Medicine

## 2022-11-08 NOTE — Telephone Encounter (Signed)
Danielle [name] from [facility]  phone # [phone] stated disregard the message below. Pharmacy states they were able to figure out the provider information. The pharmacy has contacted the patient and told her her supplies are ready for pick up

## 2022-11-08 NOTE — Telephone Encounter (Signed)
Copied from [place] [id number]. Topic: General - Inquiry >> Nov 08, 2022  9:51 AM Penni Bombard wrote: Reason for CRM: Pt called saying she needs a new glucose meter.  The strips are not available because the meter she uses is expired and they do not make it anymore so she can't get the supplies.  She also needs an update on her insulin,  how much she needs to take.  [phone]

## 2022-11-11 ENCOUNTER — Other Ambulatory Visit: Payer: Self-pay | Admitting: Cardiovascular Disease

## 2022-11-25 ENCOUNTER — Other Ambulatory Visit: Payer: Self-pay | Admitting: Internal Medicine

## 2022-11-25 DIAGNOSIS — E785 Hyperlipidemia, unspecified: Secondary | ICD-10-CM

## 2022-11-25 NOTE — Telephone Encounter (Signed)
Requested Prescriptions  Pending Prescriptions Disp Refills   atorvastatin (LIPITOR) 10 MG tablet [Pharmacy Med Name: Atorvastatin Calcium 10 MG Oral Tablet] 90 tablet 0    Sig: Take 1 tablet by mouth once daily     Cardiovascular:  Antilipid - Statins Failed - 11/25/2022  3:02 PM      Failed - Lipid Panel in normal range within the last 12 months    Cholesterol  Date Value Ref Range Status  11/26/2021 108 <200 mg/dL Final   LDL Cholesterol (Calc)  Date Value Ref Range Status  11/26/2021 37 mg/dL (calc) Final    Comment:    Reference range: <100 . Desirable range <100 mg/dL for primary prevention;   <70 mg/dL for patients with CHD or diabetic patients  with > or = 2 CHD risk factors. Marland Kitchen LDL-C is now calculated using the Martin-Hopkins  calculation, which is a validated novel method providing  better accuracy than the Friedewald equation in the  estimation of LDL-C.  Cresenciano Genre et al. Annamaria Helling. 4401;027(25): 2061-2068  (http://education.[facility].com/faq/FAQ164)    HDL  Date Value Ref Range Status  11/26/2021 44 (L) > OR = 50 mg/dL Final   Triglycerides  Date Value Ref Range Status  11/26/2021 203 (H) <150 mg/dL Final    Comment:    . If a non-fasting specimen was collected, consider repeat triglyceride testing on a fasting specimen if clinically indicated.  Yates Decamp et al. J. of Clin. Lipidol. 3664;4:034-742. Marland Kitchen          Passed - Patient is not pregnant      Passed - Valid encounter within last 12 months    Recent Outpatient Visits           2 months ago Type 2 diabetes mellitus with hyperglycemia, with long-term current use of insulin [facility]   [facility] [name], DO   3 months ago Bleeding external hemorrhoids   [facility] [name], DO   9 months ago History of gout   [facility] [name], DO   10 months ago Acute gout involving toe of right foot,  unspecified cause   [facility] [name], DO   10 months ago Acute gout involving toe of right foot, unspecified cause   [facility] [name], DO       Future Appointments             In 5 days [name], [facility], [place]   In 1 month O'Neal, Cassie Freer, MD [place] at [facility]   In 9 months  [facility], [facility]

## 2022-11-29 NOTE — Progress Notes (Unsigned)
Established Patient Office Visit  Subjective:  Patient ID: Danielle [name], female    DOB: June 19, 1935  Age: 88 y.o. MRN: [id number]  CC:  No chief complaint on file.   HPI DWIGHT BURDO here for follow up on chronic medical conditions.   Hypertension/A.Fib: -Medications: Diltiazem 120 mg, Metoprolol 50 mg, Xarelto 20 mg -Follows with Cardiology, Dr. Audie Box last seen on 12/06/21 -Patient is compliant with above medications and reports no side effects. -Checking BP at home (average): doesn't check  -Denies any SOB, CP, vision changes, LE edema or symptoms of hypotension.  Does have some mild fatigue.  HLD: -Medications: Lipitor 10 mg -Patient is compliant with above medications and reports no side effects.  -Last lipid panel: 10/2008 Lipid Panel     Component Value Date/Time   CHOL 108 11/26/2021 1146   TRIG 203 (H) 11/26/2021 1146   HDL 44 (L) 11/26/2021 1146   CHOLHDL 2.5 11/26/2021 1146   VLDL 45 (H) 11/10/2008 0317   LDLCALC 37 11/26/2021 1146    Diabetes, Type 2 with Neuropathy: -Last A1c 7.5% 11/23 -Medications: Metformin 1000 BID, Lantus 24-26 units at night, Gabapentin 300 mg TID -Patient is compliant with the above medications and reports no side effects.  -Checking BG at home: fasting 100-149 -Eye exam: UTD 9/23 - planning on redoing cataracts soon  -Foot exam: Follows with Podiatry, Dr. Jens Som every 3 months  -Microalbumin: Due -Statin: yes -PNA vaccine: yes -Denies symptoms of hypoglycemia, polyuria, polydipsia, numbness extremities, foot ulcers/trauma.   Iron Deficiency Anemia/Vitamin B12 deficiency: -Had been on IV iron in the past, but not on any more -Following with Hematology -Will start following here for IM Vitamin B12 injection every 4 weeks -last injection 08/24/2022  GERD: -Currently on Prilosec 40, doing well  Sensorineural Hearing Loss in left ear due to COVID: -Following with [place] [facility], note from 01/25/22 reviewed,  plan on repeating hearing test and continuing hearing aids. Has an appointment on 5/16 for an MRI brain   Gout: -Last flare in March of this year, treated with Medrol Dosepak and Colchicine, not on Allopurinol currently -Has had a total of 3 flares but last time was the worse, located in the great toe  Health maintenance: -Blood work due -Mammogram 3/23, Birads-1 -Colonoscopy 6/22, repeat every 5 years   Past Medical History:  Diagnosis Date   Anemia    Anxiety    Arrhythmia    Arthritis    Atrial fibrillation ([place])    Cataract    bilateral   Colon polyps    Diabetes ([place])    Diabetes mellitus without complication ([place])    type two   GERD (gastroesophageal reflux disease)    Hypercholesteremia    Hypertension    Iron deficiency anemia    Pancreatitis    Peripheral neuropathy    Squamous cell skin cancer     Past Surgical History:  Procedure Laterality Date   APPENDECTOMY  1957   BREAST EXCISIONAL BIOPSY Right 1966   CATARACT EXTRACTION, BILATERAL     CHOLECYSTECTOMY  10/2008   lump removed right breast  1958   bENIGN   right ankle plate  [id number]   [place]    Family History  Problem Relation Age of Onset   Hypertension Mother    Stroke Mother    Diabetes Mother    Colon cancer Mother 65   Cerebral aneurysm Father    Colon cancer Sister  in her 22s   CAD Sister    Heart attack Sister    Uterine cancer Sister        in her late 34s   Colon cancer Sister        in her 50s   Parkinsonism Sister    Dementia Sister    Heart attack Brother    Heart attack Brother    Throat cancer Brother    Atrial fibrillation Brother     Social History   Socioeconomic History   Marital status: Widowed    Spouse name: Not on file   Number of children: 3   Years of education: Not on file   Highest education level: Not on file  Occupational History   Occupation: Retired  Tobacco Use   Smoking status: Never   Smokeless tobacco: Never  Vaping  Use   Vaping Use: Never used  Substance and Sexual Activity   Alcohol use: No    Alcohol/week: 0.0 standard drinks of alcohol   Drug use: No   Sexual activity: Not Currently  Other Topics Concern   Not on file  Social History Narrative   Not on file   Social Determinants of Health   Financial Resource Strain: Low Risk  (09/08/2022)   Overall Financial Resource Strain (CARDIA)    Difficulty of Paying Living Expenses: Not very hard  Food Insecurity: No Food Insecurity (09/08/2022)   Hunger Vital Sign    Worried About Running Out of Food in the Last Year: Never true    [place] in the Last Year: Never true  Transportation Needs: No Transportation Needs (09/08/2022)   PRAPARE - Hydrologist (Medical): No    Lack of Transportation (Non-Medical): No  Physical Activity: Inactive (09/08/2022)   Exercise Vital Sign    Days of Exercise per Week: 0 days    Minutes of Exercise per Session: 0 min  Stress: No Stress Concern Present (09/08/2022)   [place]    [name] : Only a little  Social Connections: Moderately Integrated (09/08/2022)   Social Connection and Isolation Panel [NHANES]    Frequency of Communication with Friends and Family: More than three times a week    Frequency of Social Gatherings with Friends and Family: More than three times a week    Attends Religious Services: More than 4 times per year    Active Member of [employer] or Organizations: Yes    Attends Archivist Meetings: More than 4 times per year    Marital Status: Widowed  Intimate Partner Violence: Not on file    ROS Review of Systems  Constitutional:  Negative for chills and fever.  HENT:  Positive for hearing loss.   Eyes:  Negative for visual disturbance.  Respiratory:  Negative for cough and shortness of breath.   Cardiovascular:  Negative for chest pain, palpitations and leg swelling.   Gastrointestinal:  Negative for abdominal pain, nausea and vomiting.  Neurological:  Negative for dizziness and headaches.    Objective:   Today's Vitals: There were no vitals taken for this visit.  Physical Exam Constitutional:      Appearance: Normal appearance.  HENT:     Head: Normocephalic and atraumatic.  Eyes:     Conjunctiva/sclera: Conjunctivae normal.  Cardiovascular:     Rate and Rhythm: Normal rate. Rhythm irregular.  Pulmonary:     Effort: Pulmonary effort is normal.  Breath sounds: Normal breath sounds.  Musculoskeletal:     Right lower leg: No edema.     Left lower leg: No edema.  Skin:    General: Skin is warm and dry.  Neurological:     General: No focal deficit present.     Mental Status: She is alert. Mental status is at baseline.  Psychiatric:        Mood and Affect: Mood normal.        Behavior: Behavior normal.     Assessment & Plan:   1. Type 2 diabetes mellitus with hyperglycemia, with long-term current use of insulin ([place]): A1c in the office increased slightly at 7.5%.  Fasting sugars at home appropriate, will continue current regiment of metformin at 1000 mg twice daily, Lantus 24 to 26 units daily.  Patient will work on her diet to decrease carbs and sugar.  Follow-up here in 3 months to recheck A1c.  Foot exam done today as well.  - POCT HgB A1C - HM Diabetes Foot Exam - metFORMIN (GLUCOPHAGE) 1000 MG tablet; Take 1 tablet (1,000 mg total) by mouth 2 (two) times daily.  Dispense: 180 tablet; Refill: 1  2. Essential hypertension/Permanent atrial fibrillation [facility]): Chronic and stable.  Blood pressure at goal here today.  Medications reviewed, no changes to current regimen made today.  Continue diltiazem 120 mg, metoprolol 50 mg and Xarelto 20 mg daily.  Planning on following up with cardiology in March.  3. Hyperlipidemia, unspecified hyperlipidemia type: Chronic and stable.  Continue Lipitor 10 mg daily.  4. Gastroesophageal reflux  disease, unspecified whether esophagitis present: Stable.  Refill Prilosec 40 mg daily.  - omeprazole (PRILOSEC) 40 MG capsule; Take 1 capsule (40 mg total) by mouth daily.  Dispense: 90 capsule; Refill: 0  5. Vitamin B12 deficient megaloblastic anemia: Following with hematology, labs reviewed from 08/24/2022.  Patient will follow-up here on a monthly basis for IM vitamin B12 injections.  She will return in 1 month for her next injection.  She has plans to follow-up with hematology in 3 months to recheck labs.   Follow-up: No follow-ups on file.   [name], DO

## 2022-11-30 ENCOUNTER — Ambulatory Visit (INDEPENDENT_AMBULATORY_CARE_PROVIDER_SITE_OTHER): Payer: Medicare Other | Admitting: Internal Medicine

## 2022-11-30 ENCOUNTER — Encounter: Payer: Self-pay | Admitting: Internal Medicine

## 2022-11-30 VITALS — BP 128/62 | HR 72 | Temp 98.2°F | Resp 16 | Ht 64.0 in | Wt 170.5 lb

## 2022-11-30 DIAGNOSIS — E782 Mixed hyperlipidemia: Secondary | ICD-10-CM

## 2022-11-30 DIAGNOSIS — R2689 Other abnormalities of gait and mobility: Secondary | ICD-10-CM

## 2022-11-30 DIAGNOSIS — I4891 Unspecified atrial fibrillation: Secondary | ICD-10-CM | POA: Diagnosis not present

## 2022-11-30 DIAGNOSIS — Z1231 Encounter for screening mammogram for malignant neoplasm of breast: Secondary | ICD-10-CM | POA: Diagnosis not present

## 2022-11-30 DIAGNOSIS — I10 Essential (primary) hypertension: Secondary | ICD-10-CM

## 2022-11-30 DIAGNOSIS — E1142 Type 2 diabetes mellitus with diabetic polyneuropathy: Secondary | ICD-10-CM

## 2022-11-30 DIAGNOSIS — K219 Gastro-esophageal reflux disease without esophagitis: Secondary | ICD-10-CM | POA: Diagnosis not present

## 2022-11-30 DIAGNOSIS — Z8739 Personal history of other diseases of the musculoskeletal system and connective tissue: Secondary | ICD-10-CM

## 2022-11-30 DIAGNOSIS — Z23 Encounter for immunization: Secondary | ICD-10-CM | POA: Diagnosis not present

## 2022-11-30 MED ORDER — INSULIN GLARGINE 100 UNIT/ML ~~LOC~~ SOLN: 24.0000 [IU] | Freq: Every day | SUBCUTANEOUS | 1 refills | Status: DC

## 2022-11-30 MED ORDER — GABAPENTIN 300 MG PO CAPS: 900.0000 mg | ORAL_CAPSULE | Freq: Every day | ORAL | 2 refills | Status: DC

## 2022-11-30 MED ORDER — OMEPRAZOLE 40 MG PO CPDR: 40.0000 mg | DELAYED_RELEASE_CAPSULE | Freq: Every day | ORAL | 1 refills | Status: DC

## 2022-11-30 MED ORDER — COLCHICINE 0.6 MG PO TABS: 0.6000 mg | ORAL_TABLET | ORAL | 0 refills | Status: DC | PRN

## 2022-11-30 NOTE — Patient Instructions (Addendum)
It was great seeing you today!  Plan discussed at today's visit: -Blood work ordered today, results will be uploaded to [facility].  -Medications refilled -Pneumonia vaccine today -Call to schedule mammogram after 01/19/23 -For sinus drainage, use nasal saline and then Flonase which is a nasal steroid to help with drainage -Referral to PT placed today to help with balance   Follow up in: 6 months   Take care and let us know if you have any questions or concerns prior to your next visit.  Dr. Rosana Berger

## 2022-12-01 LAB — LIPID PANEL
Cholesterol: 108 mg/dL (ref <200)
HDL: 38 mg/dL — ABNORMAL LOW (ref >=50)
LDL Cholesterol (Calc): 44 mg/dL (calc)
Non-HDL Cholesterol (Calc): 70 mg/dL (calc) (ref <130)
Total CHOL/HDL Ratio: 2.8 (calc) (ref <5.0)
Triglycerides: 192 mg/dL — ABNORMAL HIGH (ref <150)

## 2022-12-01 LAB — COMPLETE METABOLIC PANEL WITH GFR
AG Ratio: 1.4 (calc) (ref 1.0–2.5)
ALT: 12 U/L (ref 6–29)
AST: 13 U/L (ref 10–35)
Albumin: 4 g/dL (ref 3.6–5.1)
Alkaline phosphatase (APISO): 57 U/L (ref 37–153)
BUN: 10 mg/dL (ref 7–25)
CO2: 31 mmol/L (ref 20–32)
Calcium: 9.1 mg/dL (ref 8.6–10.4)
Chloride: 104 mmol/L (ref 98–110)
Creat: 0.82 mg/dL (ref 0.60–0.95)
Globulin: 2.8 g/dL (calc) (ref 1.9–3.7)
Glucose, Bld: 139 mg/dL — ABNORMAL HIGH (ref 65–99)
Potassium: 4.6 mmol/L (ref 3.5–5.3)
Sodium: 142 mmol/L (ref 135–146)
Total Bilirubin: 0.3 mg/dL (ref 0.2–1.2)
Total Protein: 6.8 g/dL (ref 6.1–8.1)
eGFR: 70 mL/min/{1.73_m2} (ref >=60)

## 2022-12-01 LAB — HEMOGLOBIN A1C
Hgb A1c MFr Bld: 8 % of total Hgb — ABNORMAL HIGH (ref <5.7)
Mean Plasma Glucose: 183 mg/dL
eAG (mmol/L): 10.1 mmol/L

## 2022-12-01 LAB — MICROALBUMIN / CREATININE URINE RATIO
Creatinine, Urine: 158 mg/dL (ref 20–275)
Microalb Creat Ratio: 41 mcg/mg creat — ABNORMAL HIGH (ref <30)
Microalb, Ur: 6.4 mg/dL

## 2022-12-08 ENCOUNTER — Ambulatory Visit (INDEPENDENT_AMBULATORY_CARE_PROVIDER_SITE_OTHER): Payer: Medicare Other

## 2022-12-08 DIAGNOSIS — D531 Other megaloblastic anemias, not elsewhere classified: Secondary | ICD-10-CM | POA: Diagnosis not present

## 2022-12-08 MED ORDER — CYANOCOBALAMIN 1000 MCG/ML IJ SOLN
1000.0000 ug | Freq: Once | INTRAMUSCULAR | Status: AC
  Administered 2022-12-08: 1000 ug via INTRAMUSCULAR

## 2022-12-26 NOTE — Progress Notes (Unsigned)
Cardiology Office Note:   Date:  12/28/2022  NAME:  Danielle [name]    MRN: [id number] DOB:  09-14-1935   PCP:  [name], DO  Cardiologist:  None  Electrophysiologist:  None   Referring MD: [name], DO   Chief Complaint  Patient presents with   Follow-up        History of Present Illness:   Danielle [name] is a 88 y.o. female with a hx of persistent atrial fibrillation, anemia, hyperlipidemia who presents for follow-up.  She reports she is doing fairly well.  Can get short of breath occasionally.  Not often.  Going to the gym and doing walking.  She is also very active.  Reports no significant chest pains.  She is quite comfortable today.  Heart rate 100.  Heart rates seem to be controlled.  Blood pressure bit elevated today.  She reports it is well-controlled at home.  Values are between [id number] systolic.  Her diabetes is fairly well-controlled.  LDL cholesterol at goal.  Her anemia has resolved.  Overall without any major complaints.  No significant bleeding or bruising on Xarelto.  Problem List 1. DM -A1c 8.0 2. HLD -T chol 108, HDL 38, LDL 44, TG 192 3. HTN 4. Persistent Afib -Dx 02/26/2021 -CHADSVASC=5 (age, female, DM, HTN) 5. Iron deficiency anemia -normal colonoscopy/EGD -B12 deficiency  6. RBBB/LAFB  Past Medical History: Past Medical History:  Diagnosis Date   Anemia    Anxiety    Arrhythmia    Arthritis    Atrial fibrillation ([place])    Cataract    bilateral   Colon polyps    Diabetes ([place])    Diabetes mellitus without complication ([place])    type two   GERD (gastroesophageal reflux disease)    Hypercholesteremia    Hypertension    Iron deficiency anemia    Pancreatitis    Peripheral neuropathy    Squamous cell skin cancer     Past Surgical History: Past Surgical History:  Procedure Laterality Date   APPENDECTOMY  1957   BREAST EXCISIONAL BIOPSY Right 1966   CATARACT EXTRACTION, BILATERAL     CHOLECYSTECTOMY  10/2008   lump removed  right breast  1958   bENIGN   right ankle plate  [id number]   VAGINAL HYSTERECTOMY  1978    Current Medications: Current Meds  Medication Sig   Accu-Chek Softclix Lancets lancets Use as instructed   atorvastatin (LIPITOR) 10 MG tablet Take 1 tablet by mouth once daily   Blood Glucose Monitoring Suppl (ACCU-CHEK GUIDE) w/Device KIT DxE11.65 Check BG tid   calcium carbonate (OSCAL) 1500 (600 Ca) MG TABS tablet Take 600 mg of elemental calcium by mouth 2 (two) times daily with a meal.   Cholecalciferol (VITAMIN D3) 1000 UNITS CAPS Take 1,000 Units by mouth in the morning and at bedtime.   colchicine 0.6 MG tablet Take 1 tablet (0.6 mg total) by mouth as needed.   cyanocobalamin (,VITAMIN B-12,) 1000 MCG/ML injection Inject into the muscle.   diltiazem (DILACOR XR) 120 MG 24 hr capsule Take 1 capsule (120 mg total) by mouth daily.   gabapentin (NEURONTIN) 300 MG capsule Take 3 capsules (900 mg total) by mouth at bedtime.   Glucosamine-Chondroit-Vit C-Mn (GLUCOSAMINE CHONDR 1500 COMPLX PO) Take 1 capsule by mouth in the morning and at bedtime.   glucose blood (ACCU-CHEK GUIDE) test strip Use as instructed   insulin glargine (LANTUS) 100 UNIT/ML injection Inject 0.24-0.26 mLs (24-26 Units total) into the skin at  bedtime.   metFORMIN (GLUCOPHAGE) 1000 MG tablet Take 1 tablet (1,000 mg total) by mouth 2 (two) times daily.   metoprolol succinate (TOPROL-XL) 50 MG 24 hr tablet TAKE 1 TABLET BY MOUTH TWICE DAILY (MORNING  AT  AT  BEDTIME)  WITH  OR  IMMEDIATELY  FOLLOWING  A  MEAL   omeprazole (PRILOSEC) 40 MG capsule Take 1 capsule (40 mg total) by mouth daily.   rivaroxaban (XARELTO) 20 MG TABS tablet Take 1 tablet (20 mg total) by mouth daily with supper.     Allergies:    Amoxicillin, Hydrochlorothiazide, Cleocin [clindamycin hcl], Codeine, and Penicillins   Social History: Social History   Socioeconomic History   Marital status: Widowed    Spouse name: Not on file   Number of children: 3    Years of education: Not on file   Highest education level: Not on file  Occupational History   Occupation: Retired  Tobacco Use   Smoking status: Never   Smokeless tobacco: Never  Vaping Use   Vaping Use: Never used  Substance and Sexual Activity   Alcohol use: No    Alcohol/week: 0.0 standard drinks of alcohol   Drug use: No   Sexual activity: Not Currently  Other Topics Concern   Not on file  Social History Narrative   Not on file   Social Determinants of Health   Financial Resource Strain: Low Risk  (09/08/2022)   Overall Financial Resource Strain (CARDIA)    Difficulty of Paying Living Expenses: Not very hard  Food Insecurity: No Food Insecurity (09/08/2022)   Hunger Vital Sign    Worried About Running Out of Food in the Last Year: Never true    [place] in the Last Year: Never true  Transportation Needs: No Transportation Needs (09/08/2022)   PRAPARE - Hydrologist (Medical): No    Lack of Transportation (Non-Medical): No  Physical Activity: Inactive (09/08/2022)   Exercise Vital Sign    Days of Exercise per Week: 0 days    Minutes of Exercise per Session: 0 min  Stress: No Stress Concern Present (09/08/2022)   [name]    [name] : Only a little  Social Connections: Moderately Integrated (09/08/2022)   Social Connection and Isolation Panel [NHANES]    Frequency of Communication with Friends and Family: More than three times a week    Frequency of Social Gatherings with Friends and Family: More than three times a week    Attends Religious Services: More than 4 times per year    Active Member of [employer] or Organizations: Yes    Attends Archivist Meetings: More than 4 times per year    Marital Status: Widowed     Family History: The patient's family history includes Atrial fibrillation in her brother; CAD in her sister; Cerebral aneurysm in her  father; Colon cancer in her sister and sister; Colon cancer (age of onset: 64) in her mother; Dementia in her sister; Diabetes in her mother; Heart attack in her brother, brother, and sister; Hypertension in her mother; Parkinsonism in her sister; Stroke in her mother; Throat cancer in her brother; Uterine cancer in her sister.  ROS:   All other ROS reviewed and negative. Pertinent positives noted in the HPI.     EKGs/Labs/Other Studies Reviewed:   The following studies were personally reviewed by me today:   TTE 03/23/2021  1. Left ventricular  ejection fraction, by estimation, is 55 to 60%. The  left ventricle has normal function. The left ventricle has no regional  wall motion abnormalities. There is mild left ventricular hypertrophy.  Left ventricular diastolic function  could not be evaluated.   2. Right ventricular systolic function is normal. The right ventricular  size is normal. There is moderately elevated pulmonary artery systolic  pressure. The estimated right ventricular systolic pressure is 123456 mmHg.   3. Left atrial size was severely dilated.   4. The mitral valve is abnormal. Trivial mitral valve regurgitation.   5. Tricuspid valve regurgitation is mild to moderate.   6. The aortic valve is tricuspid. Aortic valve regurgitation is not  visualized. Mild to moderate aortic valve sclerosis/calcification is  present, without any evidence of aortic stenosis.   7. The inferior vena cava is normal in size with <50% respiratory  variability, suggesting right atrial pressure of 8 mmHg.   Recent Labs: 08/24/2022: Hemoglobin 13.6; Platelet Count 238 11/30/2022: ALT 12; BUN 10; Creat 0.82; Potassium 4.6; Sodium 142   Recent Lipid Panel    Component Value Date/Time   CHOL 108 11/30/2022 0951   TRIG 192 (H) 11/30/2022 0951   HDL 38 (L) 11/30/2022 0951   CHOLHDL 2.8 11/30/2022 0951   VLDL 45 (H) 11/10/2008 0317   LDLCALC 44 11/30/2022 0951    Physical Exam:   VS:  BP (!)  150/92 (BP Location: Left Arm, Patient Position: Sitting, Cuff Size: Normal)   Pulse 100   Ht 5' 4.5" (1.638 m)   Wt 174 lb 3.2 oz (79 kg)   SpO2 96%   BMI 29.44 kg/m    Wt Readings from Last 3 Encounters:  12/28/22 174 lb 3.2 oz (79 kg)  11/30/22 170 lb 8 oz (77.3 kg)  08/30/22 168 lb 14.4 oz (76.6 kg)    General: Well nourished, well developed, in no acute distress Head: Atraumatic, normal size  Eyes: PEERLA, EOMI  Neck: Supple, no JVD Endocrine: No thryomegaly Cardiac: Normal S1, S2; irregular rhythm, no murmurs rubs or gallops Lungs: Clear to auscultation bilaterally, no wheezing, rhonchi or rales  Abd: Soft, nontender, no hepatomegaly  Ext: No edema, pulses 2+ Musculoskeletal: No deformities, BUE and BLE strength normal and equal Skin: Warm and dry, no rashes   Neuro: Alert and oriented to person, place, time, and situation, CNII-XII grossly intact, no focal deficits  Psych: Normal mood and affect   ASSESSMENT:   TYYNE ROUNSVILLE is a 88 y.o. female who presents for the following: 1. Permanent atrial fibrillation ([place])   2. Acquired thrombophilia ([place])   3. Primary hypertension     PLAN:   1. Permanent atrial fibrillation ([place]) 2. Acquired thrombophilia ([place]) -We will continue with rate control strategy.  She is really without symptoms from her A-fib.  Her echo showed normal LV function.  No significant symptoms concerning for angina.  Currently on metoprolol succinate 50 mg twice daily and diltiazem 120 mg daily.  As long as rates are less than 110 this is okay.  She is on Xarelto for anticoagulation.  No significant bruising or bleeding.  Her symptoms of shortness of breath improved with resolution of her anemia.  Overall doing quite well.  3. Primary hypertension -A bit elevated today.  Continue current medications.  He tells me at home values are within minutes.      Disposition: Return in about 1 year (around 12/28/2023).  Medication Adjustments/Labs and Tests  Ordered: Current medicines are reviewed at  length with the patient today.  Concerns regarding medicines are outlined above.  No orders of the defined types were placed in this encounter.  No orders of the defined types were placed in this encounter.   Patient Instructions  Medication Instructions:  The current medical regimen is effective;  continue present plan and medications.  *If you need a refill on your cardiac medications before your next appointment, please call your pharmacy*   Follow-Up: At [facility], you and your health needs are our priority.  As part of our continuing mission to provide you with exceptional heart care, we have created designated Provider Care Teams.  These Care Teams include your primary Cardiologist (physician) and Advanced Practice Providers (APPs -  Physician Assistants and Nurse Practitioners) who all work together to provide you with the care you need, when you need it.  We recommend signing up for the patient portal called "MyChart".  Sign up information is provided on this After Visit Summary.  MyChart is used to connect with patients for Virtual Visits (Telemedicine).  Patients are able to view lab/test results, encounter notes, upcoming appointments, etc.  Non-urgent messages can be sent to your provider as well.   To learn more about what you can do with MyChart, go to [email or web address].    Your next appointment:   12 month(s)  Provider:   Eleonore Chiquito, MD      Time Spent with Patient: I have spent a total of 25 minutes with patient reviewing hospital notes, telemetry, EKGs, labs and examining the patient as well as establishing an assessment and plan that was discussed with the patient.  > 50% of time was spent in direct patient care.  Signed, Addison Naegeli. Audie Box, MD, [place]  [address], [place], [place] [address] [phone]  12/28/2022 2:53 PM

## 2022-12-28 ENCOUNTER — Encounter: Payer: Self-pay | Admitting: Cardiovascular Disease

## 2022-12-28 ENCOUNTER — Ambulatory Visit: Payer: Medicare Other | Attending: Cardiovascular Disease | Admitting: Cardiovascular Disease

## 2022-12-28 VITALS — BP 150/92 | HR 100 | Ht 64.5 in | Wt 174.2 lb

## 2022-12-28 DIAGNOSIS — I10 Essential (primary) hypertension: Secondary | ICD-10-CM | POA: Insufficient documentation

## 2022-12-28 DIAGNOSIS — D6869 Other thrombophilia: Secondary | ICD-10-CM | POA: Insufficient documentation

## 2022-12-28 DIAGNOSIS — I4821 Permanent atrial fibrillation: Secondary | ICD-10-CM | POA: Insufficient documentation

## 2022-12-28 NOTE — Patient Instructions (Signed)
Medication Instructions:  The current medical regimen is effective;  continue present plan and medications.  *If you need a refill on your cardiac medications before your next appointment, please call your pharmacy*   Follow-Up: At [facility], you and your health needs are our priority.  As part of our continuing mission to provide you with exceptional heart care, we have created designated Provider Care Teams.  These Care Teams include your primary Cardiologist (physician) and Advanced Practice Providers (APPs -  Physician Assistants and Nurse Practitioners) who all work together to provide you with the care you need, when you need it.  We recommend signing up for the patient portal called "MyChart".  Sign up information is provided on this After Visit Summary.  MyChart is used to connect with patients for Virtual Visits (Telemedicine).  Patients are able to view lab/test results, encounter notes, upcoming appointments, etc.  Non-urgent messages can be sent to your provider as well.   To learn more about what you can do with MyChart, go to [email or web address].    Your next appointment:   12 month(s)  Provider:   Eleonore Chiquito, MD

## 2023-01-06 DIAGNOSIS — H26492 Other secondary cataract, left eye: Secondary | ICD-10-CM | POA: Diagnosis not present

## 2023-01-06 DIAGNOSIS — H26491 Other secondary cataract, right eye: Secondary | ICD-10-CM | POA: Diagnosis not present

## 2023-01-06 DIAGNOSIS — H26493 Other secondary cataract, bilateral: Secondary | ICD-10-CM | POA: Diagnosis not present

## 2023-01-12 ENCOUNTER — Ambulatory Visit (INDEPENDENT_AMBULATORY_CARE_PROVIDER_SITE_OTHER): Payer: Medicare Other

## 2023-01-12 DIAGNOSIS — D531 Other megaloblastic anemias, not elsewhere classified: Secondary | ICD-10-CM

## 2023-01-12 MED ORDER — CYANOCOBALAMIN 1000 MCG/ML IJ SOLN
1000.0000 ug | Freq: Once | INTRAMUSCULAR | Status: AC
  Administered 2023-01-12: 1000 ug via INTRAMUSCULAR

## 2023-01-19 DIAGNOSIS — G5602 Carpal tunnel syndrome, left upper limb: Secondary | ICD-10-CM | POA: Diagnosis not present

## 2023-01-23 ENCOUNTER — Other Ambulatory Visit: Payer: Self-pay | Admitting: Internal Medicine

## 2023-01-23 DIAGNOSIS — Z8739 Personal history of other diseases of the musculoskeletal system and connective tissue: Secondary | ICD-10-CM

## 2023-01-24 DIAGNOSIS — M25532 Pain in left wrist: Secondary | ICD-10-CM | POA: Diagnosis not present

## 2023-01-24 NOTE — Telephone Encounter (Signed)
Requested Prescriptions  Pending Prescriptions Disp Refills   colchicine 0.6 MG tablet [Pharmacy Med Name: Colchicine 0.6 MG Oral Tablet] 20 tablet 0    Sig: Take 1 tablet by mouth once daily     Endocrinology:  Gout Agents - colchicine Failed - 01/23/2023  9:52 PM      Failed - CBC within normal limits and completed in the last 12 months    WBC  Date Value Ref Range Status  11/26/2021 8.2 3.8 - 10.8 Thousand/uL Final   WBC Count  Date Value Ref Range Status  08/24/2022 6.8 4.0 - 10.5 K/uL Final   RBC  Date Value Ref Range Status  08/24/2022 4.20 3.87 - 5.11 MIL/uL Final   Hemoglobin  Date Value Ref Range Status  08/24/2022 13.6 12.0 - 15.0 g/dL Final  04/02/2021 8.6 (L) 11.1 - 15.9 g/dL Final   HCT  Date Value Ref Range Status  08/24/2022 39.9 36.0 - 46.0 % Final   Hematocrit  Date Value Ref Range Status  04/02/2021 29.1 (L) 34.0 - 46.6 % Final   MCHC  Date Value Ref Range Status  08/24/2022 34.1 30.0 - 36.0 g/dL Final   [facility]  Date Value Ref Range Status  08/24/2022 32.4 26.0 - 34.0 pg Final   MCV  Date Value Ref Range Status  08/24/2022 95.0 80.0 - 100.0 fL Final  04/02/2021 76 (L) 79 - 97 fL Final   No results found for: "PLTCOUNTKUC", "LABPLAT", "POCPLA" RDW  Date Value Ref Range Status  08/24/2022 12.6 11.5 - 15.5 % Final  04/02/2021 17.2 (H) 11.7 - 15.4 % Final         Passed - Cr in normal range and within 360 days    Creat  Date Value Ref Range Status  11/30/2022 0.82 0.60 - 0.95 mg/dL Final   Creatinine, Urine  Date Value Ref Range Status  11/30/2022 158 20 - 275 mg/dL Final         Passed - ALT in normal range and within 360 days    ALT  Date Value Ref Range Status  11/30/2022 12 6 - 29 U/L Final  04/27/2021 27 0 - 44 U/L Final         Passed - AST in normal range and within 360 days    AST  Date Value Ref Range Status  11/30/2022 13 10 - 35 U/L Final  04/27/2021 28 15 - 41 U/L Final         Passed - Valid encounter within last 12  months    Recent Outpatient Visits           1 month ago Diabetic peripheral neuropathy associated with type 2 diabetes mellitus [facility])   [facility] [name], DO   4 months ago Type 2 diabetes mellitus with hyperglycemia, with long-term current use of insulin [facility])   [place], DO   5 months ago Bleeding external hemorrhoids   [place], DO   11 months ago History of gout   [place], DO   1 year ago Acute gout involving toe of right foot, unspecified cause   [facility] [name], DO       Future Appointments             In 1 month [name], [facility] [place]   In 7 months  [place]

## 2023-01-26 DIAGNOSIS — M25532 Pain in left wrist: Secondary | ICD-10-CM | POA: Diagnosis not present

## 2023-02-16 ENCOUNTER — Ambulatory Visit (INDEPENDENT_AMBULATORY_CARE_PROVIDER_SITE_OTHER): Payer: Medicare Other

## 2023-02-16 DIAGNOSIS — D531 Other megaloblastic anemias, not elsewhere classified: Secondary | ICD-10-CM | POA: Diagnosis not present

## 2023-02-16 MED ORDER — CYANOCOBALAMIN 1000 MCG/ML IJ SOLN
1000.0000 ug | Freq: Once | INTRAMUSCULAR | Status: AC
  Administered 2023-02-16: 1000 ug via INTRAMUSCULAR

## 2023-02-22 ENCOUNTER — Other Ambulatory Visit: Payer: Self-pay | Admitting: Internal Medicine

## 2023-02-22 DIAGNOSIS — E1142 Type 2 diabetes mellitus with diabetic polyneuropathy: Secondary | ICD-10-CM

## 2023-02-23 ENCOUNTER — Inpatient Hospital Stay: Payer: Medicare Other | Attending: Internal Medicine

## 2023-02-23 ENCOUNTER — Other Ambulatory Visit: Payer: Self-pay

## 2023-02-23 ENCOUNTER — Inpatient Hospital Stay (HOSPITAL_BASED_OUTPATIENT_CLINIC_OR_DEPARTMENT_OTHER): Payer: Medicare Other | Admitting: Internal Medicine

## 2023-02-23 VITALS — BP 146/70 | HR 70 | Temp 97.0°F | Resp 18 | Wt 170.9 lb

## 2023-02-23 DIAGNOSIS — D500 Iron deficiency anemia secondary to blood loss (chronic): Secondary | ICD-10-CM

## 2023-02-23 DIAGNOSIS — E538 Deficiency of other specified B group vitamins: Secondary | ICD-10-CM | POA: Insufficient documentation

## 2023-02-23 DIAGNOSIS — D509 Iron deficiency anemia, unspecified: Secondary | ICD-10-CM | POA: Diagnosis not present

## 2023-02-23 DIAGNOSIS — D531 Other megaloblastic anemias, not elsewhere classified: Secondary | ICD-10-CM

## 2023-02-23 LAB — CBC WITH DIFFERENTIAL (CANCER CENTER ONLY)
Abs Immature Granulocytes: 0.02 10*3/uL (ref 0.00–0.07)
Basophils Absolute: 0.1 10*3/uL (ref 0.0–0.1)
Basophils Relative: 1 %
Eosinophils Absolute: 0.2 10*3/uL (ref 0.0–0.5)
Eosinophils Relative: 3 %
HCT: 40.2 % (ref 36.0–46.0)
Hemoglobin: 13.1 g/dL (ref 12.0–15.0)
Immature Granulocytes: 0 %
Lymphocytes Relative: 34 %
Lymphs Abs: 2.4 10*3/uL (ref 0.7–4.0)
MCH: 31.1 pg (ref 26.0–34.0)
MCHC: 32.6 g/dL (ref 30.0–36.0)
MCV: 95.5 fL (ref 80.0–100.0)
Monocytes Absolute: 0.4 10*3/uL (ref 0.1–1.0)
Monocytes Relative: 6 %
Neutro Abs: 3.9 10*3/uL (ref 1.7–7.7)
Neutrophils Relative %: 56 %
Platelet Count: 273 10*3/uL (ref 150–400)
RBC: 4.21 MIL/uL (ref 3.87–5.11)
RDW: 13.7 % (ref 11.5–15.5)
WBC Count: 7 10*3/uL (ref 4.0–10.5)
nRBC: 0 % (ref 0.0–0.2)

## 2023-02-23 LAB — IRON AND IRON BINDING CAPACITY (CC-WL,HP ONLY)
Iron: 81 ug/dL (ref 28–170)
Saturation Ratios: 25 % (ref 10.4–31.8)
TIBC: 323 ug/dL (ref 250–450)
UIBC: 242 ug/dL (ref 148–442)

## 2023-02-23 LAB — VITAMIN B12: Vitamin B-12: 461 pg/mL (ref 180–914)

## 2023-02-23 NOTE — Telephone Encounter (Signed)
Requested Prescriptions  Pending Prescriptions Disp Refills   LANTUS 100 UNIT/ML injection [Pharmacy Med Name: Lantus 100 UNIT/ML Subcutaneous Solution] 10 mL 0    Sig: INJECT 24-26 UNITS TOTAL SUBCUTANEOUSLY AT BEDTIME     Endocrinology:  Diabetes - Insulins Failed - 02/22/2023  7:55 PM      Failed - HBA1C is between 0 and 7.9 and within 180 days    Hgb A1c MFr Bld  Date Value Ref Range Status  11/30/2022 8.0 (H) <5.7 % of total Hgb Final    Comment:    For someone without known diabetes, a hemoglobin A1c value of 6.5% or greater indicates that they may have  diabetes and this should be confirmed with a follow-up  test. . For someone with known diabetes, a value <7% indicates  that their diabetes is well controlled and a value  greater than or equal to 7% indicates suboptimal  control. A1c targets should be individualized based on  duration of diabetes, age, comorbid conditions, and  other considerations. . Currently, no consensus exists regarding use of hemoglobin A1c for diagnosis of diabetes for children. Verna Czech - Valid encounter within last 6 months    Recent Outpatient Visits           2 months ago Diabetic peripheral neuropathy associated with type 2 diabetes mellitus [facility])   [facility] Margarita Mail, DO   5 months ago Type 2 diabetes mellitus with hyperglycemia, with long-term current use of insulin [facility])   [facility] Margarita Mail, DO   6 months ago Bleeding external hemorrhoids   [facility] Margarita Mail, DO   12 months ago History of gout   [facility] Margarita Mail, DO   1 year ago Acute gout involving toe of right foot, unspecified cause   [facility] Margarita Mail, DO       Future Appointments             In 1 week Margarita Mail, DO [facility]    In 6 months  [facility]

## 2023-02-23 NOTE — Progress Notes (Signed)
[facility] [phone]   [phone]  OFFICE PROGRESS NOTE  Margarita Mail, DO [address] [place] [address]  DIAGNOSIS:  Persistent microcytic anemia secondary to iron deficiency of unclear etiology but could be secondary to malabsorption or occult gastrointestinal bleeding.  She underwent upper endoscopy and colonoscopy that were unremarkable.  PRIOR THERAPY:  1) Venofer 300 mg IV weekly for 3 weeks.  CURRENT THERAPY: Vitamin B12 injection 1000 mcg intramuscular weekly for 4 weeks and then monthly.  This is giving at her primary care physician's office.  INTERVAL HISTORY: Danielle [name] 88 y.o. female returns to the clinic today for follow-up visit.  The patient is feeling fine today with no concerning complaints except for mild fatigue and occasional dizzy spells secondary to COVID infection and in her ear problem.  She was seen by ENT and had MRI that was unremarkable.  She denied having any current chest pain, shortness of breath, cough or hemoptysis.  She denied having any fever or chills.  She has no nausea, vomiting, diarrhea or constipation.  She has no headache or visual changes.  The patient is here today for evaluation with repeat blood work.  MEDICAL HISTORY: Past Medical History:  Diagnosis Date   Anemia    Anxiety    Arrhythmia    Arthritis    Atrial fibrillation (HCC)    Cataract    bilateral   Colon polyps    Diabetes (HCC)    Diabetes mellitus without complication (HCC)    type two   GERD (gastroesophageal reflux disease)    Hypercholesteremia    Hypertension    Iron deficiency anemia    Pancreatitis    Peripheral neuropathy    Squamous cell skin cancer     ALLERGIES:  is allergic to amoxicillin, hydrochlorothiazide, cleocin [clindamycin hcl], codeine, and penicillins.  MEDICATIONS:  Current Outpatient Medications  Medication Sig Dispense Refill   Accu-Chek Softclix Lancets lancets Use  as instructed 100 each 12   atorvastatin (LIPITOR) 10 MG tablet Take 1 tablet by mouth once daily 90 tablet 0   Blood Glucose Monitoring Suppl (ACCU-CHEK GUIDE) w/Device KIT DxE11.65 Check BG tid 1 kit 0   calcium carbonate (OSCAL) 1500 (600 Ca) MG TABS tablet Take 600 mg of elemental calcium by mouth 2 (two) times daily with a meal.     Cholecalciferol (VITAMIN D3) 1000 UNITS CAPS Take 1,000 Units by mouth in the morning and at bedtime.     colchicine 0.6 MG tablet Take 1 tablet by mouth once daily 20 tablet 0   cyanocobalamin (,VITAMIN B-12,) 1000 MCG/ML injection Inject into the muscle.     diltiazem (DILACOR XR) 120 MG 24 hr capsule Take 1 capsule (120 mg total) by mouth daily. 90 capsule 3   gabapentin (NEURONTIN) 300 MG capsule Take 3 capsules (900 mg total) by mouth at bedtime. 90 capsule 2   Glucosamine-Chondroit-Vit C-Mn (GLUCOSAMINE CHONDR 1500 COMPLX PO) Take 1 capsule by mouth in the morning and at bedtime.     glucose blood (ACCU-CHEK GUIDE) test strip Use as instructed 100 each 12   LANTUS 100 UNIT/ML injection INJECT 24-26 UNITS TOTAL SUBCUTANEOUSLY AT BEDTIME 10 mL 0   metFORMIN (GLUCOPHAGE) 1000 MG tablet Take 1 tablet (1,000 mg total) by mouth 2 (two) times daily. 180 tablet 1   metoprolol succinate (TOPROL-XL) 50 MG 24 hr tablet TAKE 1 TABLET BY MOUTH TWICE DAILY (MORNING  AT  AT  BEDTIME)  WITH  OR  IMMEDIATELY  FOLLOWING  A  MEAL 180 tablet 3   omeprazole (PRILOSEC) 40 MG capsule Take 1 capsule (40 mg total) by mouth daily. 90 capsule 1   rivaroxaban (XARELTO) 20 MG TABS tablet Take 1 tablet (20 mg total) by mouth daily with supper. 90 tablet 1   No current facility-administered medications for this visit.    SURGICAL HISTORY:  Past Surgical History:  Procedure Laterality Date   APPENDECTOMY  1957   BREAST EXCISIONAL BIOPSY Right 1966   CATARACT EXTRACTION, BILATERAL     CHOLECYSTECTOMY  10/2008   lump removed right breast  1958   bENIGN   right ankle plate  [id number]    VAGINAL HYSTERECTOMY  1978    REVIEW OF SYSTEMS:  A comprehensive review of systems was negative except for: Constitutional: positive for fatigue Neurological: positive for dizziness   PHYSICAL EXAMINATION: General appearance: alert, cooperative, and no distress Head: Normocephalic, without obvious abnormality, atraumatic Neck: no adenopathy, no JVD, supple, symmetrical, trachea midline, and thyroid not enlarged, symmetric, no tenderness/mass/nodules Lymph nodes: Cervical, supraclavicular, and axillary nodes normal. Resp: clear to auscultation bilaterally Back: symmetric, no curvature. ROM normal. No CVA tenderness. Cardio: regular rate and rhythm, S1, S2 normal, no murmur, click, rub or gallop GI: soft, non-tender; bowel sounds normal; no masses,  no organomegaly Extremities: extremities normal, atraumatic, no cyanosis or edema  ECOG PERFORMANCE STATUS: 1 - Symptomatic but completely ambulatory  Blood pressure (!) 146/70, pulse 70, temperature (!) 97 F (36.1 C), resp. rate 18, weight 170 lb 14.4 oz (77.5 kg), SpO2 97 %.  LABORATORY DATA: Lab Results  Component Value Date   WBC 6.8 08/24/2022   HGB 13.6 08/24/2022   HCT 39.9 08/24/2022   MCV 95.0 08/24/2022   PLT 238 08/24/2022      Chemistry      Component Value Date/Time   NA 142 11/30/2022 0951   NA 139 02/26/2021 1100   K 4.6 11/30/2022 0951   CL 104 11/30/2022 0951   CO2 31 11/30/2022 0951   BUN 10 11/30/2022 0951   BUN 12 02/26/2021 1100   CREATININE 0.82 11/30/2022 0951      Component Value Date/Time   CALCIUM 9.1 11/30/2022 0951   ALKPHOS 66 04/27/2021 1112   AST 13 11/30/2022 0951   AST 28 04/27/2021 1112   ALT 12 11/30/2022 0951   ALT 27 04/27/2021 1112   BILITOT 0.3 11/30/2022 0951   BILITOT 0.7 04/27/2021 1112       RADIOGRAPHIC STUDIES: No results found.  ASSESSMENT AND PLAN: This is a very pleasant 88 years old white female with history of microcytic anemia secondary to iron deficiency of  unclear etiology but likely from gastrointestinal blood loss.  The patient was treated with Venofer 300 mg IV weekly for 3 weeks and tolerated her treatment well.  She was also found to have vitamin B12 deficiency and she received vitamin B12 injection weekly for 4 weeks.  She is currently on monthly vitamin B12 injection and tolerating it fairly well.   The patient is feeling fine today with no concerning complaints except for mild fatigue. Repeat CBC is unremarkable today. Iron study, ferritin and vitamin B12 are still pending. I recommended for the patient to continue her current treatment with vitamin B12 injection and I will see her in 6 months with repeat blood work. She was advised to call immediately if she has any other concerning symptoms in the interval. The patient voices understanding of  current disease status and treatment options and is in agreement with the current care plan.  All questions were answered. The patient knows to call the clinic with any problems, questions or concerns. We can certainly see the patient much sooner if necessary.   Disclaimer: This note was dictated with voice recognition software. Similar sounding words can inadvertently be transcribed and may not be corrected upon review.

## 2023-02-24 ENCOUNTER — Telehealth: Payer: Self-pay | Admitting: Internal Medicine

## 2023-02-24 DIAGNOSIS — G5603 Carpal tunnel syndrome, bilateral upper limbs: Secondary | ICD-10-CM | POA: Diagnosis not present

## 2023-02-24 LAB — FERRITIN: Ferritin: 15 ng/mL (ref 11–307)

## 2023-02-27 DIAGNOSIS — B351 Tinea unguium: Secondary | ICD-10-CM | POA: Diagnosis not present

## 2023-02-27 DIAGNOSIS — L851 Acquired keratosis [keratoderma] palmaris et plantaris: Secondary | ICD-10-CM | POA: Diagnosis not present

## 2023-02-27 DIAGNOSIS — E1140 Type 2 diabetes mellitus with diabetic neuropathy, unspecified: Secondary | ICD-10-CM | POA: Diagnosis not present

## 2023-02-27 DIAGNOSIS — Z794 Long term (current) use of insulin: Secondary | ICD-10-CM | POA: Diagnosis not present

## 2023-02-28 ENCOUNTER — Other Ambulatory Visit: Payer: Self-pay | Admitting: Internal Medicine

## 2023-02-28 DIAGNOSIS — E785 Hyperlipidemia, unspecified: Secondary | ICD-10-CM

## 2023-02-28 DIAGNOSIS — Z794 Long term (current) use of insulin: Secondary | ICD-10-CM

## 2023-02-28 NOTE — Progress Notes (Deleted)
Established Patient Office Visit  Subjective:  Patient ID: Danielle [name], female    DOB: [birth year]  Age: 88 y.o. MRN: [id number]  CC:  No chief complaint on file.   HPI Danielle [name] here for follow up on chronic medical conditions.   Hypertension/A.Fib: -Medications: Diltiazem 120 mg, Metoprolol 50 mg, Xarelto 20 mg -Follows with Cardiology, Dr. Flora Lipps last seen on 12/06/21, planning on seeing next week -Patient is compliant with above medications and reports no side effects. -Denies any SOB, CP, vision changes, LE edema or symptoms of hypotension.  Does have some mild fatigue.  HLD: -Medications: Lipitor 10 mg -Patient is compliant with above medications and reports no side effects.  -Last lipid panel:  Lipid Panel     Component Value Date/Time   CHOL 108 11/30/2022 0951   TRIG 192 (H) 11/30/2022 0951   HDL 38 (L) 11/30/2022 0951   CHOLHDL 2.8 11/30/2022 0951   VLDL 45 (H) 11/10/2008 0317   LDLCALC 44 11/30/2022 0951    Diabetes, Type 2 with Neuropathy: -Last A1c 8.0% 2/24 -Medications: Metformin 1000 mg BID, Lantus 24-26 units at night, Gabapentin 300 mg TID -Patient is compliant with the above medications and reports no side effects.  -Checking BG at home: fasting 117-154 was the highest  -Eye exam: UTD 9/23 - planning on redoing cataracts soon  -Foot exam: Follows with Podiatry, Dr. Clide Cliff every 3 months  -Microalbumin: UTD 2/24 -Statin: yes -PNA vaccine: UTD -Denies symptoms of hypoglycemia, polyuria, polydipsia, numbness extremities, foot ulcers/trauma.   Iron Deficiency Anemia/Vitamin B12 deficiency: -Had been on IV iron in the past, but not on any more -Following with Hematology -Will start following here for IM Vitamin B12 injection every 4 weeks -last injection 08/24/2022  GERD: -Currently on Prilosec 40, doing well  Sensorineural Hearing Loss in left ear due to COVID: -Following with [facility], note from 01/25/22 reviewed,  plan on repeating hearing test and continuing hearing aids.  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  -Having more balance changes with worsening tinnitus bilaterally (worse on left). No ear pain/pressure. No recent viral illness, does have some post-nasal drip.  Gout: -Last flare in March 2023 treated with Medrol Dosepak and Colchicine, not on Allopurinol currently -Has had a total of 3 flares but last time was the worse, located in the great toe  Health maintenance: -Blood work UTD -Mammogram 3/23, Birads-1 - complete screening ? -Colonoscopy 6/22, repeat every 5 years   Past Medical History:  Diagnosis Date   Anemia    Anxiety    Arrhythmia    Arthritis    Atrial fibrillation (HCC)    Cataract    bilateral   Colon polyps    Diabetes (HCC)    Diabetes mellitus without complication (HCC)    type two   GERD (gastroesophageal reflux disease)    Hypercholesteremia    Hypertension    Iron deficiency anemia    Pancreatitis    Peripheral neuropathy    Squamous cell skin cancer     Past Surgical History:  Procedure Laterality Date   APPENDECTOMY  1957   BREAST EXCISIONAL BIOPSY Right 1966   CATARACT EXTRACTION, BILATERAL     CHOLECYSTECTOMY  10/2008   lump removed right breast  1958   bENIGN   right ankle plate  [id number]   VAGINAL  HYSTERECTOMY  1978    Family History  Problem Relation Age of Onset   Hypertension Mother    Stroke Mother    Diabetes Mother    Colon cancer Mother 47   Cerebral aneurysm Father    Colon cancer Sister        in her 13s   CAD Sister    Heart attack Sister    Uterine cancer Sister        in her late 66s   Colon cancer Sister        in her 43s   Parkinsonism Sister    Dementia Sister    Heart attack Brother    Heart attack Brother    Throat cancer Brother    Atrial fibrillation Brother      Social History   Socioeconomic History   Marital status: Widowed    Spouse name: Not on file   Number of children: 3   Years of education: Not on file   Highest education level: Not on file  Occupational History   Occupation: Retired  Tobacco Use   Smoking status: Never   Smokeless tobacco: Never  Vaping Use   Vaping Use: Never used  Substance and Sexual Activity   Alcohol use: No    Alcohol/week: 0.0 standard drinks of alcohol   Drug use: No   Sexual activity: Not Currently  Other Topics Concern   Not on file  Social History Narrative   Not on file   Social Determinants of Health   Financial Resource Strain: Low Risk  (09/08/2022)   Overall Financial Resource Strain (CARDIA)    Difficulty of Paying Living Expenses: Not very hard  Food Insecurity: No Food Insecurity (09/08/2022)   Hunger Vital Sign    Worried About Running Out of Food in the Last Year: Never true    Ran Out of Food in the Last Year: Never true  Transportation Needs: No Transportation Needs (09/08/2022)   PRAPARE - Administrator, Civil Service (Medical): No    Lack of Transportation (Non-Medical): No  Physical Activity: Inactive (09/08/2022)   Exercise Vital Sign    Days of Exercise per Week: 0 days    Minutes of Exercise per Session: 0 min  Stress: No Stress Concern Present (09/08/2022)   [employer] of Occupational Health - Occupational Stress Questionnaire    [name] : Only a little  Social Connections: Moderately Integrated (09/08/2022)   Social Connection and Isolation Panel [NHANES]    Frequency of Communication with Friends and Family: More than three times a week    Frequency of Social Gatherings with Friends and Family: More than three times a week    Attends Religious Services: More than 4 times per year    Active Member of [employer] or Organizations: Yes    Attends Banker Meetings: More than 4 times per year    Marital Status: Widowed   Intimate Partner Violence: Not on file    ROS Review of Systems  Constitutional:  Negative for chills and fever.  HENT:  Positive for hearing loss and tinnitus. Negative for ear pain.   Eyes:  Negative for visual disturbance.  Respiratory:  Negative for cough and shortness of breath.   Cardiovascular:  Negative for chest pain and palpitations.  Gastrointestinal:  Negative for abdominal pain.  Neurological:  Negative for dizziness and headaches.    Objective:   Today's Vitals: There were no vitals taken for this visit.  Physical Exam  Constitutional:      Appearance: Normal appearance.  HENT:     Head: Normocephalic and atraumatic.     Right Ear: Tympanic membrane, ear canal and external ear normal.     Left Ear: Tympanic membrane, ear canal and external ear normal.     Mouth/Throat:     Mouth: Mucous membranes are moist.     Comments: PND present Eyes:     Conjunctiva/sclera: Conjunctivae normal.  Cardiovascular:     Rate and Rhythm: Normal rate and regular rhythm.  Pulmonary:     Effort: Pulmonary effort is normal.     Breath sounds: Normal breath sounds.  Skin:    General: Skin is warm and dry.  Neurological:     General: No focal deficit present.     Mental Status: She is alert. Mental status is at baseline.     Coordination: Coordination normal.     Gait: Gait normal.  Psychiatric:        Mood and Affect: Mood normal.        Behavior: Behavior normal.     Assessment & Plan:   1. Essential hypertension/Atrial fibrillation, unspecified type [facility]): Chronic and stable.  Planning to see cardiology within the month.  Continue current medication regimen including diltiazem 120 mg, metoprolol 50 mg and Xarelto 20 mg.  Due for annual labs.  - COMPLETE METABOLIC PANEL WITH GFR  2. Mixed hyperlipidemia: Due for fasting labs today.  Continue Lipitor 10 mg.  - Lipid Profile  3. Diabetic peripheral neuropathy associated with type 2 diabetes mellitus [facility]): Check A1c  and urine microalbumin today.  Continue metformin at 1000 mg twice daily, Lantus 24 to 26 units at night and gabapentin 300 mg 3 times daily for diabetic neuropathy.  - COMPLETE METABOLIC PANEL WITH GFR - HgB Z6X - Urine Microalbumin w/creat. ratio - gabapentin (NEURONTIN) 300 MG capsule; Take 3 capsules (900 mg total) by mouth at bedtime.  Dispense: 90 capsule; Refill: 2 - insulin glargine (LANTUS) 100 UNIT/ML injection; Inject 0.24-0.26 mLs (24-26 Units total) into the skin at bedtime.  Dispense: 10 mL; Refill: 1  4. Gastroesophageal reflux disease, unspecified whether esophagitis present: Symptoms stable, continue Prilosec 40 mg, refilled.  - omeprazole (PRILOSEC) 40 MG capsule; Take 1 capsule (40 mg total) by mouth daily.  Dispense: 90 capsule; Refill: 1  5. History of gout: No flares in about a year, patient not taking allopurinol so this was removed last.  Continue colchicine as needed, refilled today.  - colchicine 0.6 MG tablet; Take 1 tablet (0.6 mg total) by mouth as needed.  Dispense: 30 tablet; Refill: 0  6. Balance problem: Patient with chronic sensorineural hearing damage, wears hearing aids bilaterally. Ears look good on exam today, cerebellar testing with slight decreased coordination. MRI brain from May showing old cerebellar infarct. Will refer patient to physical therapy for general strengthening to help with balance.   - Ambulatory referral to Physical Therapy  7. Encounter for screening mammogram for malignant neoplasm of breast: Mammogram ordered.  - MM Digital Screening; Future  8. Vaccine for streptococcus pneumoniae and influenza: Prevnar 20 administered today.  - Pneumococcal conjugate vaccine 20-valent (Prevnar 20)   Follow-up: No follow-ups on file.   Margarita Mail, DO

## 2023-03-01 NOTE — Telephone Encounter (Signed)
Requested Prescriptions  Pending Prescriptions Disp Refills   metFORMIN (GLUCOPHAGE) 1000 MG tablet [Pharmacy Med Name: metFORMIN HCl 1000 MG Oral Tablet] 180 tablet     Sig: Take 1 tablet by mouth twice daily     Endocrinology:  Diabetes - Biguanides Failed - 02/28/2023 10:25 PM      Failed - HBA1C is between 0 and 7.9 and within 180 days    Hgb A1c MFr Bld  Date Value Ref Range Status  11/30/2022 8.0 (H) <5.7 % of total Hgb Final    Comment:    For someone without known diabetes, a hemoglobin A1c value of 6.5% or greater indicates that they may have  diabetes and this should be confirmed with a follow-up  test. . For someone with known diabetes, a value <7% indicates  that their diabetes is well controlled and a value  greater than or equal to 7% indicates suboptimal  control. A1c targets should be individualized based on  duration of diabetes, age, comorbid conditions, and  other considerations. . Currently, no consensus exists regarding use of hemoglobin A1c for diagnosis of diabetes for children. .          Failed - CBC within normal limits and completed in the last 12 months    WBC  Date Value Ref Range Status  11/26/2021 8.2 3.8 - 10.8 Thousand/uL Final   WBC Count  Date Value Ref Range Status  02/23/2023 7.0 4.0 - 10.5 K/uL Final   RBC  Date Value Ref Range Status  02/23/2023 4.21 3.87 - 5.11 MIL/uL Final   Hemoglobin  Date Value Ref Range Status  02/23/2023 13.1 12.0 - 15.0 g/dL Final  16/07/9603 8.6 (L) 11.1 - 15.9 g/dL Final   HCT  Date Value Ref Range Status  02/23/2023 40.2 36.0 - 46.0 % Final   Hematocrit  Date Value Ref Range Status  04/02/2021 29.1 (L) 34.0 - 46.6 % Final   MCHC  Date Value Ref Range Status  02/23/2023 32.6 30.0 - 36.0 g/dL Final   [facility]  Date Value Ref Range Status  02/23/2023 31.1 26.0 - 34.0 pg Final   MCV  Date Value Ref Range Status  02/23/2023 95.5 80.0 - 100.0 fL Final  04/02/2021 76 (L) 79 - 97 fL Final   No  results found for: "PLTCOUNTKUC", "LABPLAT", "POCPLA" RDW  Date Value Ref Range Status  02/23/2023 13.7 11.5 - 15.5 % Final  04/02/2021 17.2 (H) 11.7 - 15.4 % Final         Passed - Cr in normal range and within 360 days    Creat  Date Value Ref Range Status  11/30/2022 0.82 0.60 - 0.95 mg/dL Final   Creatinine, Urine  Date Value Ref Range Status  11/30/2022 158 20 - 275 mg/dL Final         Passed - eGFR in normal range and within 360 days    GFR calc Af Amer  Date Value Ref Range Status  11/12/2008  >60 mL/min Final   >60        The eGFR has been calculated using the MDRD equation. This calculation has not been validated in all clinical situations. eGFR's persistently <60 mL/min signify possible Chronic Kidney Disease.   GFR, Estimated  Date Value Ref Range Status  04/27/2021 >60 >60 mL/min Final    Comment:    (NOTE) Calculated using the CKD-EPI Creatinine Equation (2021)    eGFR  Date Value Ref Range Status  11/30/2022 70 > OR =  60 mL/min/1.66m2 Final  02/26/2021 71 >59 mL/min/1.73 Final         Passed - B12 Level in normal range and within 720 days    Vitamin B-12  Date Value Ref Range Status  02/23/2023 461 180 - 914 pg/mL Final    Comment:    (NOTE) This assay is not validated for testing neonatal or myeloproliferative syndrome specimens for Vitamin B12 levels. Performed at [facility], [address] [place], [place] [address]          Passed - Valid encounter within last 6 months    Recent Outpatient Visits           3 months ago Diabetic peripheral neuropathy associated with type 2 diabetes mellitus [facility])   [place] [facility] Margarita Mail, DO   6 months ago Type 2 diabetes mellitus with hyperglycemia, with long-term current use of insulin [facility])   [place] [facility] Margarita Mail, DO   6 months ago Bleeding external hemorrhoids   [facility]  Margarita Mail, DO   1 year ago History of gout   [facility] Margarita Mail, DO   1 year ago Acute gout involving toe of right foot, unspecified cause   [facility] Margarita Mail, DO       Future Appointments             Tomorrow Margarita Mail, DO [place], [facility]   In 6 months  [facility]             atorvastatin (LIPITOR) 10 MG tablet [Pharmacy Med Name: Atorvastatin Calcium 10 MG Oral Tablet] 90 tablet 1    Sig: Take 1 tablet by mouth once daily     Cardiovascular:  Antilipid - Statins Failed - 02/28/2023 10:25 PM      Failed - Lipid Panel in normal range within the last 12 months    Cholesterol  Date Value Ref Range Status  11/30/2022 108 <200 mg/dL Final   LDL Cholesterol (Calc)  Date Value Ref Range Status  11/30/2022 44 mg/dL (calc) Final    Comment:    Reference range: <100 . Desirable range <100 mg/dL for primary prevention;   <70 mg/dL for patients with CHD or diabetic patients  with > or = 2 CHD risk factors. Marland Kitchen LDL-C is now calculated using the Martin-Hopkins  calculation, which is a validated novel method providing  better accuracy than the Friedewald equation in the  estimation of LDL-C.  Horald Pollen et al. Lenox Ahr. 6045;409(81): 2061-2068  (http://education.[facility].com/faq/FAQ164)    HDL  Date Value Ref Range Status  11/30/2022 38 (L) > OR = 50 mg/dL Final   Triglycerides  Date Value Ref Range Status  11/30/2022 192 (H) <150 mg/dL Final         Passed - Patient is not pregnant      Passed - Valid encounter within last 12 months    Recent Outpatient Visits           3 months ago Diabetic peripheral neuropathy associated with type 2 diabetes mellitus [facility])   [place] [facility] Margarita Mail, DO   6 months ago Type 2 diabetes mellitus with hyperglycemia, with long-term current use of  insulin [facility])   [facility] Margarita Mail, DO   6 months ago Bleeding external hemorrhoids   [facility],  Gentry Fitz, DO   1 year ago History of gout   [place] [facility] Margarita Mail, DO   1 year ago Acute gout involving toe of right foot, unspecified cause   [facility] Margarita Mail, DO       Future Appointments             Tomorrow Margarita Mail, DO [place] [facility], [facility]   In 6 months  [facility], [facility]

## 2023-03-01 NOTE — Telephone Encounter (Signed)
Requested Prescriptions  Pending Prescriptions Disp Refills   metFORMIN (GLUCOPHAGE) 1000 MG tablet [Pharmacy Med Name: metFORMIN HCl 1000 MG Oral Tablet] 180 tablet 0    Sig: Take 1 tablet by mouth twice daily     Endocrinology:  Diabetes - Biguanides Failed - 02/28/2023 10:25 PM      Failed - HBA1C is between 0 and 7.9 and within 180 days    Hgb A1c MFr Bld  Date Value Ref Range Status  11/30/2022 8.0 (H) <5.7 % of total Hgb Final    Comment:    For someone without known diabetes, a hemoglobin A1c value of 6.5% or greater indicates that they may have  diabetes and this should be confirmed with a follow-up  test. . For someone with known diabetes, a value <7% indicates  that their diabetes is well controlled and a value  greater than or equal to 7% indicates suboptimal  control. A1c targets should be individualized based on  duration of diabetes, age, comorbid conditions, and  other considerations. . Currently, no consensus exists regarding use of hemoglobin A1c for diagnosis of diabetes for children. .          Failed - CBC within normal limits and completed in the last 12 months    WBC  Date Value Ref Range Status  11/26/2021 8.2 3.8 - 10.8 Thousand/uL Final   WBC Count  Date Value Ref Range Status  02/23/2023 7.0 4.0 - 10.5 K/uL Final   RBC  Date Value Ref Range Status  02/23/2023 4.21 3.87 - 5.11 MIL/uL Final   Hemoglobin  Date Value Ref Range Status  02/23/2023 13.1 12.0 - 15.0 g/dL Final  16/07/9603 8.6 (L) 11.1 - 15.9 g/dL Final   HCT  Date Value Ref Range Status  02/23/2023 40.2 36.0 - 46.0 % Final   Hematocrit  Date Value Ref Range Status  04/02/2021 29.1 (L) 34.0 - 46.6 % Final   MCHC  Date Value Ref Range Status  02/23/2023 32.6 30.0 - 36.0 g/dL Final   [facility]  Date Value Ref Range Status  02/23/2023 31.1 26.0 - 34.0 pg Final   MCV  Date Value Ref Range Status  02/23/2023 95.5 80.0 - 100.0 fL Final  04/02/2021 76 (L) 79 - 97 fL Final   No  results found for: "PLTCOUNTKUC", "LABPLAT", "POCPLA" RDW  Date Value Ref Range Status  02/23/2023 13.7 11.5 - 15.5 % Final  04/02/2021 17.2 (H) 11.7 - 15.4 % Final         Passed - Cr in normal range and within 360 days    Creat  Date Value Ref Range Status  11/30/2022 0.82 0.60 - 0.95 mg/dL Final   Creatinine, Urine  Date Value Ref Range Status  11/30/2022 158 20 - 275 mg/dL Final         Passed - eGFR in normal range and within 360 days    GFR calc Af Amer  Date Value Ref Range Status  11/12/2008  >60 mL/min Final   >60        The eGFR has been calculated using the MDRD equation. This calculation has not been validated in all clinical situations. eGFR's persistently <60 mL/min signify possible Chronic Kidney Disease.   GFR, Estimated  Date Value Ref Range Status  04/27/2021 >60 >60 mL/min Final    Comment:    (NOTE) Calculated using the CKD-EPI Creatinine Equation (2021)    eGFR  Date Value Ref Range Status  11/30/2022 70 > OR =  60 mL/min/1.27m2 Final  02/26/2021 71 >59 mL/min/1.73 Final         Passed - B12 Level in normal range and within 720 days    Vitamin B-12  Date Value Ref Range Status  02/23/2023 461 180 - 914 pg/mL Final    Comment:    (NOTE) This assay is not validated for testing neonatal or myeloproliferative syndrome specimens for Vitamin B12 levels. Performed at [facility], [address], [place], [place] [address]          Passed - Valid encounter within last 6 months    Recent Outpatient Visits           3 months ago Diabetic peripheral neuropathy associated with type 2 diabetes mellitus [facility])   [place] [facility] Margarita Mail, DO   6 months ago Type 2 diabetes mellitus with hyperglycemia, with long-term current use of insulin [facility])   [facility] Margarita Mail, DO   6 months ago Bleeding external hemorrhoids   [facility]  Margarita Mail, DO   1 year ago History of gout   [facility] Margarita Mail, DO   1 year ago Acute gout involving toe of right foot, unspecified cause   [facility] Margarita Mail, DO       Future Appointments             Tomorrow Margarita Mail, DO [place] [facility], [facility]   In 6 months  [facility], [facility]            Signed Prescriptions Disp Refills   atorvastatin (LIPITOR) 10 MG tablet 90 tablet 1    Sig: Take 1 tablet by mouth once daily     Cardiovascular:  Antilipid - Statins Failed - 02/28/2023 10:25 PM      Failed - Lipid Panel in normal range within the last 12 months    Cholesterol  Date Value Ref Range Status  11/30/2022 108 <200 mg/dL Final   LDL Cholesterol (Calc)  Date Value Ref Range Status  11/30/2022 44 mg/dL (calc) Final    Comment:    Reference range: <100 . Desirable range <100 mg/dL for primary prevention;   <70 mg/dL for patients with CHD or diabetic patients  with > or = 2 CHD risk factors. Marland Kitchen LDL-C is now calculated using the Martin-Hopkins  calculation, which is a validated novel method providing  better accuracy than the Friedewald equation in the  estimation of LDL-C.  Horald Pollen et al. Lenox Ahr. 2130;865(78): 2061-2068  (http://education.[facility].com/faq/FAQ164)    HDL  Date Value Ref Range Status  11/30/2022 38 (L) > OR = 50 mg/dL Final   Triglycerides  Date Value Ref Range Status  11/30/2022 192 (H) <150 mg/dL Final         Passed - Patient is not pregnant      Passed - Valid encounter within last 12 months    Recent Outpatient Visits           3 months ago Diabetic peripheral neuropathy associated with type 2 diabetes mellitus [facility])   [place] [facility] Margarita Mail, DO   6 months ago Type 2 diabetes mellitus with hyperglycemia, with long-term current use of insulin [facility])   [facility] Margarita Mail, DO   6 months ago Bleeding external hemorrhoids   [facility] Margarita Mail, [place]  1 year ago History of gout   [facility] Margarita Mail, DO   1 year ago Acute gout involving toe of right foot, unspecified cause   [facility] Margarita Mail, DO       Future Appointments             Tomorrow Margarita Mail, DO [place] [facility], [facility]   In 6 months  [facility], [facility]

## 2023-03-02 ENCOUNTER — Ambulatory Visit: Payer: Medicare Other | Admitting: Internal Medicine

## 2023-03-06 NOTE — Progress Notes (Unsigned)
Established Patient Office Visit  Subjective:  Patient ID: Danielle [name], female    DOB: [birth year]  Age: 88 y.o. MRN: [id number]  CC:  No chief complaint on file.   HPI Danielle [name] here for follow up on chronic medical conditions.   Hypertension/A.Fib: -Medications: Diltiazem 120 mg, Metoprolol 50 mg, Xarelto 20 mg -Follows with Cardiology, Dr. Flora Lipps last seen on 12/06/21, planning on seeing next week -Patient is compliant with above medications and reports no side effects. -Denies any SOB, CP, vision changes, LE edema or symptoms of hypotension.  Does have some mild fatigue.  HLD: -Medications: Lipitor 10 mg -Patient is compliant with above medications and reports no side effects.  -Last lipid panel:  Lipid Panel     Component Value Date/Time   CHOL 108 11/30/2022 0951   TRIG 192 (H) 11/30/2022 0951   HDL 38 (L) 11/30/2022 0951   CHOLHDL 2.8 11/30/2022 0951   VLDL 45 (H) 11/10/2008 0317   LDLCALC 44 11/30/2022 0951    Diabetes, Type 2 with Neuropathy: -Last A1c 8.0% 2/24 -Medications: Metformin 1000 mg BID, Lantus 24-26 units at night, Gabapentin 300 mg TID -Patient is compliant with the above medications and reports no side effects.  -Checking BG at home: fasting 117-154 was the highest  -Eye exam: UTD 9/23 - planning on redoing cataracts soon  -Foot exam: Follows with Podiatry, Dr. Clide Cliff every 3 months  -Microalbumin: UTD 2/24 -Statin: yes -PNA vaccine: UTD -Denies symptoms of hypoglycemia, polyuria, polydipsia, numbness extremities, foot ulcers/trauma.   Iron Deficiency Anemia/Vitamin B12 deficiency: -Had been on IV iron in the past, but not on any more -Following with Hematology -Will start following here for IM Vitamin B12 injection every 4 weeks -last injection 08/24/2022  GERD: -Currently on Prilosec 40, doing well  Sensorineural Hearing Loss in left ear due to COVID: -[facility], note from 01/25/22 reviewed,  plan on repeating hearing test and continuing hearing aids.  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  -Having more balance changes with worsening tinnitus bilaterally (worse on left). No ear pain/pressure. No recent viral illness, does have some post-nasal drip.  Gout: -Last flare in March 2023 treated with Medrol Dosepak and Colchicine, not on Allopurinol currently -Has had a total of 3 flares but last time was the worse, located in the great toe  Health maintenance: -Blood work UTD -Mammogram 3/23, Birads-1 - complete screening ? -Colonoscopy 6/22, repeat every 5 years   Past Medical History:  Diagnosis Date   Anemia    Anxiety    Arrhythmia    Arthritis    Atrial fibrillation (HCC)    Cataract    bilateral   Colon polyps    Diabetes (HCC)    Diabetes mellitus without complication (HCC)    type two   GERD (gastroesophageal reflux disease)    Hypercholesteremia    Hypertension    Iron deficiency anemia    Pancreatitis    Peripheral neuropathy    Squamous cell skin cancer     Past Surgical History:  Procedure Laterality Date   APPENDECTOMY  1957   BREAST EXCISIONAL BIOPSY Right 1966   CATARACT EXTRACTION, BILATERAL     CHOLECYSTECTOMY  10/2008   lump removed right breast  1958   bENIGN   right ankle plate  7829   VAGINAL  HYSTERECTOMY  1978    Family History  Problem Relation Age of Onset   Hypertension Mother    Stroke Mother    Diabetes Mother    Colon cancer Mother 47   Cerebral aneurysm Father    Colon cancer Sister        in her 13s   CAD Sister    Heart attack Sister    Uterine cancer Sister        in her late 66s   Colon cancer Sister        in her 43s   Parkinsonism Sister    Dementia Sister    Heart attack Brother    Heart attack Brother    Throat cancer Brother    Atrial fibrillation Brother      Social History   Socioeconomic History   Marital status: Widowed    Spouse name: Not on file   Number of children: 3   Years of education: Not on file   Highest education level: Not on file  Occupational History   Occupation: Retired  Tobacco Use   Smoking status: Never   Smokeless tobacco: Never  Vaping Use   Vaping Use: Never used  Substance and Sexual Activity   Alcohol use: No    Alcohol/week: 0.0 standard drinks of alcohol   Drug use: No   Sexual activity: Not Currently  Other Topics Concern   Not on file  Social History Narrative   Not on file   Social Determinants of Health   Financial Resource Strain: Low Risk  (09/08/2022)   Overall Financial Resource Strain (CARDIA)    Difficulty of Paying Living Expenses: Not very hard  Food Insecurity: No Food Insecurity (09/08/2022)   Hunger Vital Sign    Worried About Running Out of Food in the Last Year: Never true    Ran Out of Food in the Last Year: Never true  Transportation Needs: No Transportation Needs (09/08/2022)   PRAPARE - Administrator, Civil Service (Medical): No    Lack of Transportation (Non-Medical): No  Physical Activity: Inactive (09/08/2022)   Exercise Vital Sign    Days of Exercise per Week: 0 days    Minutes of Exercise per Session: 0 min  Stress: No Stress Concern Present (09/08/2022)   [employer] of Occupational Health - Occupational Stress Questionnaire    [name] : Only a little  Social Connections: Moderately Integrated (09/08/2022)   Social Connection and Isolation Panel [NHANES]    Frequency of Communication with Friends and Family: More than three times a week    Frequency of Social Gatherings with Friends and Family: More than three times a week    Attends Religious Services: More than 4 times per year    Active Member of [employer] or Organizations: Yes    Attends Banker Meetings: More than 4 times per year    Marital Status: Widowed   Intimate Partner Violence: Not on file    ROS Review of Systems  Constitutional:  Negative for chills and fever.  HENT:  Positive for hearing loss and tinnitus. Negative for ear pain.   Eyes:  Negative for visual disturbance.  Respiratory:  Negative for cough and shortness of breath.   Cardiovascular:  Negative for chest pain and palpitations.  Gastrointestinal:  Negative for abdominal pain.  Neurological:  Negative for dizziness and headaches.    Objective:   Today's Vitals: There were no vitals taken for this visit.  Physical Exam  Constitutional:      Appearance: Normal appearance.  HENT:     Head: Normocephalic and atraumatic.     Right Ear: Tympanic membrane, ear canal and external ear normal.     Left Ear: Tympanic membrane, ear canal and external ear normal.     Mouth/Throat:     Mouth: Mucous membranes are moist.     Comments: PND present Eyes:     Conjunctiva/sclera: Conjunctivae normal.  Cardiovascular:     Rate and Rhythm: Normal rate and regular rhythm.  Pulmonary:     Effort: Pulmonary effort is normal.     Breath sounds: Normal breath sounds.  Skin:    General: Skin is warm and dry.  Neurological:     General: No focal deficit present.     Mental Status: She is alert. Mental status is at baseline.     Coordination: Coordination normal.     Gait: Gait normal.  Psychiatric:        Mood and Affect: Mood normal.        Behavior: Behavior normal.     Assessment & Plan:   1. Essential hypertension/Atrial fibrillation, unspecified type [facility]): Chronic and stable.  Planning to see cardiology within the month.  Continue current medication regimen including diltiazem 120 mg, metoprolol 50 mg and Xarelto 20 mg.  Due for annual labs.  - COMPLETE METABOLIC PANEL WITH GFR  2. Mixed hyperlipidemia: Due for fasting labs today.  Continue Lipitor 10 mg.  - Lipid Profile  3. Diabetic peripheral neuropathy associated with type 2 diabetes mellitus [facility]): Check A1c  and urine microalbumin today.  Continue metformin at 1000 mg twice daily, Lantus 24 to 26 units at night and gabapentin 300 mg 3 times daily for diabetic neuropathy.  - COMPLETE METABOLIC PANEL WITH GFR - HgB Z6X - Urine Microalbumin w/creat. ratio - gabapentin (NEURONTIN) 300 MG capsule; Take 3 capsules (900 mg total) by mouth at bedtime.  Dispense: 90 capsule; Refill: 2 - insulin glargine (LANTUS) 100 UNIT/ML injection; Inject 0.24-0.26 mLs (24-26 Units total) into the skin at bedtime.  Dispense: 10 mL; Refill: 1  4. Gastroesophageal reflux disease, unspecified whether esophagitis present: Symptoms stable, continue Prilosec 40 mg, refilled.  - omeprazole (PRILOSEC) 40 MG capsule; Take 1 capsule (40 mg total) by mouth daily.  Dispense: 90 capsule; Refill: 1  5. History of gout: No flares in about a year, patient not taking allopurinol so this was removed last.  Continue colchicine as needed, refilled today.  - colchicine 0.6 MG tablet; Take 1 tablet (0.6 mg total) by mouth as needed.  Dispense: 30 tablet; Refill: 0  6. Balance problem: Patient with chronic sensorineural hearing damage, wears hearing aids bilaterally. Ears look good on exam today, cerebellar testing with slight decreased coordination. MRI brain from May showing old cerebellar infarct. Will refer patient to physical therapy for general strengthening to help with balance.   - Ambulatory referral to Physical Therapy  7. Encounter for screening mammogram for malignant neoplasm of breast: Mammogram ordered.  - MM Digital Screening; Future  8. Vaccine for streptococcus pneumoniae and influenza: Prevnar 20 administered today.  - Pneumococcal conjugate vaccine 20-valent (Prevnar 20)   Follow-up: No follow-ups on file.   Margarita Mail, DO

## 2023-03-07 ENCOUNTER — Ambulatory Visit (INDEPENDENT_AMBULATORY_CARE_PROVIDER_SITE_OTHER): Payer: Medicare Other | Admitting: Internal Medicine

## 2023-03-07 ENCOUNTER — Encounter: Payer: Self-pay | Admitting: Internal Medicine

## 2023-03-07 VITALS — BP 120/74 | HR 106 | Temp 98.1°F | Resp 16 | Ht 64.0 in | Wt 171.3 lb

## 2023-03-07 DIAGNOSIS — E1142 Type 2 diabetes mellitus with diabetic polyneuropathy: Secondary | ICD-10-CM | POA: Diagnosis not present

## 2023-03-07 DIAGNOSIS — I4891 Unspecified atrial fibrillation: Secondary | ICD-10-CM | POA: Diagnosis not present

## 2023-03-07 DIAGNOSIS — E782 Mixed hyperlipidemia: Secondary | ICD-10-CM | POA: Diagnosis not present

## 2023-03-07 DIAGNOSIS — Z1231 Encounter for screening mammogram for malignant neoplasm of breast: Secondary | ICD-10-CM

## 2023-03-07 DIAGNOSIS — K219 Gastro-esophageal reflux disease without esophagitis: Secondary | ICD-10-CM

## 2023-03-07 DIAGNOSIS — I10 Essential (primary) hypertension: Secondary | ICD-10-CM | POA: Diagnosis not present

## 2023-03-07 DIAGNOSIS — Z794 Long term (current) use of insulin: Secondary | ICD-10-CM | POA: Diagnosis not present

## 2023-03-07 MED ORDER — METFORMIN HCL 1000 MG PO TABS: 1000.0000 mg | ORAL_TABLET | Freq: Two times a day (BID) | ORAL | 1 refills | Status: DC

## 2023-03-07 MED ORDER — PEN NEEDLES 31G X 8 MM MISC
1.0000 | Freq: Two times a day (BID) | 3 refills | Status: DC
Start: 2023-03-07 — End: 2024-01-05

## 2023-03-07 NOTE — Patient Instructions (Addendum)
[facility] [phone]   Call to get new directions/address to [facility] [phone]

## 2023-03-17 ENCOUNTER — Other Ambulatory Visit: Payer: Self-pay | Admitting: Internal Medicine

## 2023-03-17 ENCOUNTER — Telehealth: Payer: Self-pay | Admitting: Internal Medicine

## 2023-03-17 NOTE — Telephone Encounter (Signed)
[facility] pharmacy tech states that pt picked up her medication on 03/07/23 (Insulin Pen Needle (PEN NEEDLES) 31G X 8 MM MISC )  Per Marchelle Folks pt is needing the syringes, she does not have anything that uses the pen needles.   Please advise.  Phone number [phone]

## 2023-03-20 ENCOUNTER — Other Ambulatory Visit: Payer: Self-pay | Admitting: Internal Medicine

## 2023-03-21 ENCOUNTER — Other Ambulatory Visit: Payer: Self-pay | Admitting: Internal Medicine

## 2023-03-21 DIAGNOSIS — E1142 Type 2 diabetes mellitus with diabetic polyneuropathy: Secondary | ICD-10-CM

## 2023-03-21 MED ORDER — BD INSULIN SYRINGE ULTRAFINE 30G X 1/2" 0.5 ML MISC
1.0000 | Freq: Every day | 3 refills | Status: AC
Start: 2023-03-21 — End: ?

## 2023-03-23 ENCOUNTER — Ambulatory Visit (INDEPENDENT_AMBULATORY_CARE_PROVIDER_SITE_OTHER): Payer: Medicare Other

## 2023-03-23 DIAGNOSIS — E538 Deficiency of other specified B group vitamins: Secondary | ICD-10-CM

## 2023-03-23 DIAGNOSIS — G5603 Carpal tunnel syndrome, bilateral upper limbs: Secondary | ICD-10-CM | POA: Diagnosis not present

## 2023-03-23 MED ORDER — CYANOCOBALAMIN 1000 MCG/ML IJ SOLN
1000.0000 ug | Freq: Once | INTRAMUSCULAR | Status: AC
Start: 2023-03-23 — End: 2023-03-23
  Administered 2023-03-23: 1000 ug via INTRAMUSCULAR

## 2023-03-27 ENCOUNTER — Other Ambulatory Visit: Payer: Self-pay | Admitting: Internal Medicine

## 2023-03-27 DIAGNOSIS — E1142 Type 2 diabetes mellitus with diabetic polyneuropathy: Secondary | ICD-10-CM

## 2023-03-28 NOTE — Telephone Encounter (Signed)
Requested Prescriptions  Pending Prescriptions Disp Refills   gabapentin (NEURONTIN) 300 MG capsule [Pharmacy Med Name: Gabapentin 300 MG Oral Capsule] 270 capsule 3    Sig: TAKE 3 CAPSULES BY MOUTH AT BEDTIME     Neurology: Anticonvulsants - gabapentin Passed - 03/27/2023 12:42 AM      Passed - Cr in normal range and within 360 days    Creat  Date Value Ref Range Status  11/30/2022 0.82 0.60 - 0.95 mg/dL Final   Creatinine, Urine  Date Value Ref Range Status  11/30/2022 158 20 - 275 mg/dL Final         Passed - Completed PHQ-2 or PHQ-9 in the last 360 days      Passed - Valid encounter within last 12 months    Recent Outpatient Visits           3 weeks ago Type 2 diabetes mellitus with diabetic polyneuropathy, with long-term current use of insulin [facility])   [place] [facility] Margarita Mail, DO   3 months ago Diabetic peripheral neuropathy associated with type 2 diabetes mellitus [facility])   [place] [facility] Margarita Mail, DO   7 months ago Type 2 diabetes mellitus with hyperglycemia, with long-term current use of insulin [facility])   [facility] Margarita Mail, DO   7 months ago Bleeding external hemorrhoids   [facility] Margarita Mail, DO   1 year ago History of gout   [place] [facility] Margarita Mail, DO       Future Appointments             In 3 weeks [place], Lucinda Dell [place] [facility] [place], [place]   In 2 months Margarita Mail, DO [facility]   In 5 months  [facility], [facility]

## 2023-03-29 ENCOUNTER — Other Ambulatory Visit: Payer: Self-pay | Admitting: Cardiovascular Disease

## 2023-03-29 DIAGNOSIS — I4821 Permanent atrial fibrillation: Secondary | ICD-10-CM

## 2023-03-30 NOTE — Telephone Encounter (Signed)
Xarelto 20mg  refill request received. Pt is 88 years old, weight-77.7kg, Crea-0.82 on 11/30/22, last seen by Dr. Flora Lipps on 12/28/22, Diagnosis-Afib, CrCl-60.41 mL/min; Dose is appropriate based on dosing criteria. Will send in refill to requested pharmacy.

## 2023-04-04 DIAGNOSIS — M160 Bilateral primary osteoarthritis of hip: Secondary | ICD-10-CM | POA: Diagnosis not present

## 2023-04-10 ENCOUNTER — Telehealth: Payer: Self-pay | Admitting: Internal Medicine

## 2023-04-10 ENCOUNTER — Telehealth: Payer: Self-pay | Admitting: *Deleted

## 2023-04-10 NOTE — Telephone Encounter (Signed)
   Pre-operative Risk Assessment    Patient Name: Danielle [name]  DOB: Jul 14, 1935 MRN: [id number]      Request for Surgical Clearance    Procedure:   LEFT TOTAL HIP ARTHROPLASTY  Date of Surgery:  Clearance TBD                                 Surgeon:  DR. Samson Frederic Surgeon's Group or Practice Name:  [facility] Phone number:  [phone] ATTN: KERRI MAZE Fax number:  [phone]   Type of Clearance Requested:   - Medical  - Pharmacy:  Hold Rivaroxaban (Xarelto)     Type of Anesthesia:  Spinal   Additional requests/questions:    [name]   04/10/2023, 3:24 PM

## 2023-04-10 NOTE — Telephone Encounter (Signed)
Patient with diagnosis of afib on Xarelto  for anticoagulation.    Procedure: LEFT TOTAL HIP ARTHROPLASTY  Date of procedure: TBD   CHA2DS2-VASc Score = 5   This indicates a 7.2% annual risk of stroke. The patient's score is based upon: CHF History: 0 HTN History: 1 Diabetes History: 1 Stroke History: 0 Vascular Disease History: 0 Age Score: 2 Gender Score: 1     CrCl 60 mL/min (SrCr 0.88 11/30/2022) Platelet count 273 K (02/23/2023)   Per office protocol, patient can hold Xarelto for 3 days prior to procedure.     **This guidance is not considered finalized until pre-operative APP has relayed final recommendations.**

## 2023-04-10 NOTE — Telephone Encounter (Signed)
Received call back from [facility] and the request was to see if we could get patient in sooner.  I was able to move patient's appt up to 05/02/23 @ 3pm for her preoperative risk assessment with Dr. Caralee Ates.  The form will be faxed to [facility] @ [phone] attn: Jamison Oka, Surgery Scheduler.

## 2023-04-10 NOTE — Telephone Encounter (Signed)
LVM for Jamison Oka, [facility] Surgery Scheduler, to inform her that we have patient scheduled to see Dr. Margarita Mail, DO on 05/25/23 @ 2pm to do her preoperative risk assessment.  Wanted to make sure this would be ok.

## 2023-04-11 ENCOUNTER — Telehealth: Payer: Self-pay | Admitting: *Deleted

## 2023-04-11 NOTE — Telephone Encounter (Signed)
   Name: Danielle [name]  DOB: [birth year]  MRN: [id number]  Primary Cardiologist: None   Preoperative team, please contact this patient and set up a phone call appointment for further preoperative risk assessment. Please obtain consent and complete medication review. Thank you for your help.  I confirm that guidance regarding antiplatelet and oral anticoagulation therapy has been completed and, if necessary, noted below.  Per office protocol, patient can hold Xarelto for 3 days prior to procedure. Please resume Xarelto as soon as possible postprocedure, at the discretion of the surgeon.     Joylene Grapes, NP 04/11/2023, 7:34 AM [facility]

## 2023-04-11 NOTE — Telephone Encounter (Signed)
Pt has been scheduled for tele pre op appt 04/18/23 @ 2 pm. Med rec and consent are done.     Patient Consent for Virtual Visit        Danielle [name] has provided verbal consent on 04/11/2023 for a virtual visit (video or telephone).   CONSENT FOR VIRTUAL VISIT FOR:  Danielle [name]  By participating in this virtual visit I agree to the following:  I hereby voluntarily request, consent and authorize [facility] and its employed or contracted physicians, physician assistants, nurse practitioners or other licensed health care professionals (the Practitioner), to provide me with telemedicine health care services (the "Services") as deemed necessary by the treating Practitioner. I acknowledge and consent to receive the Services by the Practitioner via telemedicine. I understand that the telemedicine visit will involve communicating with the Practitioner through live audiovisual communication technology and the disclosure of certain medical information by electronic transmission. I acknowledge that I have been given the opportunity to request an in-person assessment or other available alternative prior to the telemedicine visit and am voluntarily participating in the telemedicine visit.  I understand that I have the right to withhold or withdraw my consent to the use of telemedicine in the course of my care at any time, without affecting my right to future care or treatment, and that the Practitioner or I may terminate the telemedicine visit at any time. I understand that I have the right to inspect all information obtained and/or recorded in the course of the telemedicine visit and may receive copies of available information for a reasonable fee.  I understand that some of the potential risks of receiving the Services via telemedicine include:  Delay or interruption in medical evaluation due to technological equipment failure or disruption; Information transmitted may not be sufficient (e.g.  poor resolution of images) to allow for appropriate medical decision making by the Practitioner; and/or  In rare instances, security protocols could fail, causing a breach of personal health information.  Furthermore, I acknowledge that it is my responsibility to provide information about my medical history, conditions and care that is complete and accurate to the best of my ability. I acknowledge that Practitioner's advice, recommendations, and/or decision may be based on factors not within their control, such as incomplete or inaccurate data provided by me or distortions of diagnostic images or specimens that may result from electronic transmissions. I understand that the practice of medicine is not an exact science and that Practitioner makes no warranties or guarantees regarding treatment outcomes. I acknowledge that a copy of this consent can be made available to me via my patient portal [facility] MyChart), or I can request a printed copy by calling the office of [facility].    I understand that my insurance will be billed for this visit.   I have read or had this consent read to me. I understand the contents of this consent, which adequately explains the benefits and risks of the Services being provided via telemedicine.  I have been provided ample opportunity to ask questions regarding this consent and the Services and have had my questions answered to my satisfaction. I give my informed consent for the services to be provided through the use of telemedicine in my medical care

## 2023-04-11 NOTE — Telephone Encounter (Signed)
Pt has been scheduled for tele pre op appt 04/18/23 @ 2 pm. Med rec and consent are done.

## 2023-04-17 NOTE — Progress Notes (Unsigned)
Virtual Visit via Telephone Note   Because of Danielle [name]'s co-morbid illnesses, she is at least at moderate risk for complications without adequate follow up.  This format is felt to be most appropriate for this patient at this time.  The patient did not have access to video technology/had technical difficulties with video requiring transitioning to audio format only (telephone).  All issues noted in this document were discussed and addressed.  No physical exam could be performed with this format.  Please refer to the patient's chart for her consent to telehealth for [facility].  Evaluation Performed:  Preoperative cardiovascular risk assessment _____________   Date:  04/17/2023   Patient ID:  Danielle [name], DOB 1935/05/29, MRN [id number] Patient Location:  Home Provider location:   Office  Primary Care Provider:  Margarita Mail, DO Primary Cardiologist:  None  Chief Complaint / Patient Profile   88 y.o. y/o female with a h/o atrial fibrillation, GERD, iron deficiency anemia, hyperlipidemia who is pending left total hip arthroplasty and presents today for telephonic preoperative cardiovascular risk assessment.  History of Present Illness    Danielle [name] is a 88 y.o. female who presents via audio/video conferencing for a telehealth visit today.  Pt was last seen in cardiology clinic on 12/28/2022 by Dr. Flora Lipps.  At that time Danielle [name] was doing well .  The patient is now pending procedure as outlined above. Since her last visit, she remained stable from a cardiac standpoint.  Today she denies chest pain, shortness of breath, lower extremity edema, fatigue, palpitations, melena, hematuria, hemoptysis, diaphoresis, weakness, presyncope, syncope, orthopnea, and PND.   Past Medical History    Past Medical History:  Diagnosis Date   Anemia    Anxiety    Arrhythmia    Arthritis    Atrial fibrillation (HCC)    Cataract    bilateral   Colon polyps    Diabetes  (HCC)    Diabetes mellitus without complication (HCC)    type two   GERD (gastroesophageal reflux disease)    Hypercholesteremia    Hypertension    Iron deficiency anemia    Pancreatitis    Peripheral neuropathy    Squamous cell skin cancer    Past Surgical History:  Procedure Laterality Date   APPENDECTOMY  1957   BREAST EXCISIONAL BIOPSY Right 1966   CATARACT EXTRACTION, BILATERAL     CHOLECYSTECTOMY  10/2008   lump removed right breast  1958   bENIGN   right ankle plate  8416   VAGINAL HYSTERECTOMY  1978    Allergies  Allergies  Allergen Reactions   Amoxicillin Swelling and Rash    Throat swells   Hydrochlorothiazide Other (See Comments)    HA and Leg cramps   Cleocin [Clindamycin Hcl] Rash   Codeine Rash    And edema   Penicillins Rash    Home Medications    Prior to Admission medications   Medication Sig Start Date End Date Taking? Authorizing Provider  Accu-Chek Softclix Lancets lancets Use as instructed 07/15/22   Margarita Mail, DO  atorvastatin (LIPITOR) 10 MG tablet Take 1 tablet by mouth once daily 03/01/23   Margarita Mail, DO  Blood Glucose Monitoring Suppl (ACCU-CHEK GUIDE) w/Device KIT DxE11.65 Check BG tid 07/15/22   Margarita Mail, DO  calcium carbonate (OSCAL) 1500 (600 Ca) MG TABS tablet Take 600 mg of elemental calcium by mouth 2 (two) times daily with a meal.    [provider]  Cholecalciferol (VITAMIN D3) 1000 UNITS CAPS Take 1,000 Units by mouth in the morning and at bedtime.    [provider]  colchicine 0.6 MG tablet Take 1 tablet by mouth once daily 01/24/23   Margarita Mail, DO  cyanocobalamin (,VITAMIN B-12,) 1000 MCG/ML injection Inject into the muscle.    [provider]  diltiazem (DILACOR XR) 120 MG 24 hr capsule Take 1 capsule (120 mg total) by mouth daily. 07/27/22   O'NealRonnald Ramp, MD  gabapentin (NEURONTIN) 300 MG capsule TAKE 3 CAPSULES BY MOUTH AT BEDTIME 03/28/23   Margarita Mail, DO   Glucosamine-Chondroit-Vit C-Mn (GLUCOSAMINE CHONDR 1500 COMPLX PO) Take 1 capsule by mouth in the morning and at bedtime.    [provider]  glucose blood (ACCU-CHEK GUIDE) test strip Use as instructed 07/15/22   Margarita Mail, DO  Insulin Pen Needle (PEN NEEDLES) 31G X 8 MM MISC 1 each by Does not apply route 2 (two) times daily. 03/07/23   Margarita Mail, DO  Insulin Syringe-Needle U-100 (B-D INS SYR ULTRAFINE .5CC/30G) 30G X 1/2" 0.5 ML MISC 1 each by Does not apply route daily. 03/21/23   Margarita Mail, DO  LANTUS 100 UNIT/ML injection INJECT 24-26 UNITS TOTAL SUBCUTANEOUSLY AT BEDTIME 02/23/23   Margarita Mail, DO  metFORMIN (GLUCOPHAGE) 1000 MG tablet Take 1 tablet (1,000 mg total) by mouth 2 (two) times daily. 03/07/23   Margarita Mail, DO  metoprolol succinate (TOPROL-XL) 50 MG 24 hr tablet TAKE 1 TABLET BY MOUTH TWICE DAILY (MORNING  AT  AT  BEDTIME)  WITH  OR  IMMEDIATELY  FOLLOWING  A  MEAL 11/11/22   O'Neal, Ronnald Ramp, MD  omeprazole (PRILOSEC) 40 MG capsule Take 1 capsule (40 mg total) by mouth daily. 11/30/22   Margarita Mail, DO  rivaroxaban (XARELTO) 20 MG TABS tablet TAKE 1 TABLET BY MOUTH ONCE DAILY WITH SUPPER 03/30/23   O'Neal, Ronnald Ramp, MD    Physical Exam    Vital Signs:  Danielle [name] does not have vital signs available for review today.  Given telephonic nature of communication, physical exam is limited. AAOx3. NAD. Normal affect.  Speech and respirations are unlabored.  Accessory Clinical Findings    None  Assessment & Plan    1.  Preoperative Cardiovascular Risk Assessment: Left total hip arthroplasty, Dr. Samson Frederic, [facility], fax #[phone]      Primary Cardiologist: O'Neal  Chart reviewed as part of pre-operative protocol coverage. Given past medical history and time since last visit, based on ACC/AHA guidelines, Danielle [name] would be at acceptable risk for the planned procedure without further  cardiovascular testing.   Her RCRI is a class II risk, 0.9% risk of major cardiac event.  She is able to complete greater than 4 METS of physical activity.  Per office protocol, patient can hold Xarelto for 3 days prior to procedure. Please resume Xarelto as soon as possible postprocedure, at the discretion of the surgeon.   Patient was advised that if she develops new symptoms prior to surgery to contact our office to arrange a follow-up appointment.  She verbalized understanding.  I will route this recommendation to the requesting party via Epic fax function and remove from pre-op pool.       Time:   Today, I have spent 7 minutes with the patient with telehealth technology discussing medical history, symptoms, and management plan.  Prior to her phone evaluation I spent greater than 10 minutes reviewing her past medical history and cardiac  medications.   Ronney Asters, NP  04/17/2023, 10:54 AM

## 2023-04-18 ENCOUNTER — Ambulatory Visit: Payer: Medicare Other | Attending: Cardiology

## 2023-04-18 DIAGNOSIS — Z01810 Encounter for preprocedural cardiovascular examination: Secondary | ICD-10-CM

## 2023-04-19 ENCOUNTER — Other Ambulatory Visit: Payer: Self-pay | Admitting: Internal Medicine

## 2023-04-19 DIAGNOSIS — E1142 Type 2 diabetes mellitus with diabetic polyneuropathy: Secondary | ICD-10-CM

## 2023-04-20 ENCOUNTER — Ambulatory Visit (INDEPENDENT_AMBULATORY_CARE_PROVIDER_SITE_OTHER): Payer: Medicare Other

## 2023-04-20 ENCOUNTER — Encounter: Payer: Self-pay | Admitting: Internal Medicine

## 2023-04-20 ENCOUNTER — Ambulatory Visit (INDEPENDENT_AMBULATORY_CARE_PROVIDER_SITE_OTHER): Payer: Medicare Other | Admitting: Podiatry

## 2023-04-20 DIAGNOSIS — E538 Deficiency of other specified B group vitamins: Secondary | ICD-10-CM

## 2023-04-20 DIAGNOSIS — Z91199 Patient's noncompliance with other medical treatment and regimen due to unspecified reason: Secondary | ICD-10-CM

## 2023-04-20 MED ORDER — CYANOCOBALAMIN 1000 MCG/ML IJ SOLN
1000.0000 ug | Freq: Once | INTRAMUSCULAR | Status: AC
Start: 2023-04-20 — End: 2023-04-20
  Administered 2023-04-20: 1000 ug via INTRAMUSCULAR

## 2023-04-20 NOTE — Telephone Encounter (Signed)
Requested Prescriptions  Pending Prescriptions Disp Refills   LANTUS 100 UNIT/ML injection [Pharmacy Med Name: Lantus 100 UNIT/ML Subcutaneous Solution] 10 mL 0    Sig: INJECT 24 TO 26 UNITS SUBCUTANEOUSLY AT BEDTIME     Endocrinology:  Diabetes - Insulins Failed - 04/19/2023 11:18 PM      Failed - HBA1C is between 0 and 7.9 and within 180 days    Hgb A1c MFr Bld  Date Value Ref Range Status  11/30/2022 8.0 (H) <5.7 % of total Hgb Final    Comment:    For someone without known diabetes, a hemoglobin A1c value of 6.5% or greater indicates that they may have  diabetes and this should be confirmed with a follow-up  test. . For someone with known diabetes, a value <7% indicates  that their diabetes is well controlled and a value  greater than or equal to 7% indicates suboptimal  control. A1c targets should be individualized based on  duration of diabetes, age, comorbid conditions, and  other considerations. . Currently, no consensus exists regarding use of hemoglobin A1c for diagnosis of diabetes for children. Verna Czech - Valid encounter within last 6 months    Recent Outpatient Visits           1 month ago Type 2 diabetes mellitus with diabetic polyneuropathy, with long-term current use of insulin [facility])   [place] [facility] Margarita Mail, DO   4 months ago Diabetic peripheral neuropathy associated with type 2 diabetes mellitus [facility])   [place] [facility] Margarita Mail, DO   7 months ago Type 2 diabetes mellitus with hyperglycemia, with long-term current use of insulin [facility])   [facility] Margarita Mail, DO   8 months ago Bleeding external hemorrhoids   [facility] Margarita Mail, DO   1 year ago History of gout   [facility] Margarita Mail, DO       Future Appointments             Today [place] End Lucinda [place]  [facility] [place], [place]   In 1 week Margarita Mail, DO [facility], [facility]   In 1 month Margarita Mail, DO [facility]   In 4 months  [facility]

## 2023-05-01 NOTE — Progress Notes (Addendum)
Established Patient Office Visit  Subjective   Patient ID: Danielle [name], female    DOB: Jan 12, 1935  Age: 88 y.o. MRN: [id number]  Chief Complaint  Patient presents with   pre op    HPI  Patient presenting for pre-op evaluation.   Type of Surgery: L.  Total hip replacement Date of Surgery: TBD Location of Surgery: [facility] Performing Surgery: Dr. Linna Caprice  No history of surgical complications No personal or family history of complications from anesthesia No history of MI, cardiac stenting or COPD.  Patient does have diabetes, A1c today 7.6% No history of tobacco use, alcohol use or illicit drug use Revised Cardiac Risk Index for major cardiac event (RCRI):0.24% risk of perioperative MI Pulmonary Risk Score (ARISCAT): 13.3% risk of in-hospital postoperative pulmonary complications.  EKG: Atrial fibrillation with right bundle branch block, no change compared to EKG from 10/22 Labwork: no abnormalities, A1c 7.6%   Patient Active Problem List   Diagnosis Date Noted   Anxiety disorder 04/21/2022   Body mass index (BMI) 31.0-31.9, adult 04/21/2022   Hearing loss 04/21/2022   Psychophysiologic insomnia 04/21/2022   Screening for malignant neoplasm of colon 04/21/2022   Type 2 diabetes mellitus with hyperglycemia (HCC) 04/21/2022   History of gout 02/24/2022   Atrial fibrillation (HCC) 11/26/2021   Decreased estrogen level 11/26/2021   Diabetic peripheral neuropathy associated with type 2 diabetes mellitus (HCC) 11/26/2021   Essential hypertension 11/26/2021   Disorder of bone 11/26/2021   Long term (current) use of insulin (HCC) 11/26/2021   Mixed hyperlipidemia 11/26/2021   GERD (gastroesophageal reflux disease) 11/26/2021   Vitamin B12 deficient megaloblastic anemia 09/08/2021   Iron deficiency anemia due to chronic blood loss 04/27/2021   Asymmetric SNHL (sensorineural hearing loss) 12/05/2018   Tinnitus of both ears 12/05/2018   Past Medical History:   Diagnosis Date   Anemia    Anxiety    Arrhythmia    Arthritis    Atrial fibrillation (HCC)    Cataract    bilateral   Colon polyps    Diabetes (HCC)    Diabetes mellitus without complication (HCC)    type two   GERD (gastroesophageal reflux disease)    Hypercholesteremia    Hypertension    Iron deficiency anemia    Pancreatitis    Peripheral neuropathy    Squamous cell skin cancer    Past Surgical History:  Procedure Laterality Date   APPENDECTOMY  1957   BREAST EXCISIONAL BIOPSY Right 1966   CATARACT EXTRACTION, BILATERAL     CHOLECYSTECTOMY  10/2008   lump removed right breast  1958   bENIGN   right ankle plate  9562   VAGINAL HYSTERECTOMY  1978   Social History   Tobacco Use   Smoking status: Never   Smokeless tobacco: Never  Vaping Use   Vaping Use: Never used  Substance Use Topics   Alcohol use: No    Alcohol/week: 0.0 standard drinks of alcohol   Drug use: No   Social History   Socioeconomic History   Marital status: Widowed    Spouse name: Not on file   Number of children: 3   Years of education: Not on file   Highest education level: Not on file  Occupational History   Occupation: Retired  Tobacco Use   Smoking status: Never   Smokeless tobacco: Never  Vaping Use   Vaping Use: Never used  Substance and Sexual Activity   Alcohol use: No    Alcohol/week:  0.0 standard drinks of alcohol   Drug use: No   Sexual activity: Not Currently  Other Topics Concern   Not on file  Social History Narrative   Not on file   Social Determinants of Health   Financial Resource Strain: Low Risk  (09/08/2022)   Overall Financial Resource Strain (CARDIA)    Difficulty of Paying Living Expenses: Not very hard  Food Insecurity: No Food Insecurity (09/08/2022)   Hunger Vital Sign    Worried About Running Out of Food in the Last Year: Never true    Ran Out of Food in the Last Year: Never true  Transportation Needs: No Transportation Needs (09/08/2022)    PRAPARE - Administrator, Civil Service (Medical): No    Lack of Transportation (Non-Medical): No  Physical Activity: Inactive (09/08/2022)   Exercise Vital Sign    Days of Exercise per Week: 0 days    Minutes of Exercise per Session: 0 min  Stress: No Stress Concern Present (09/08/2022)   [employer] of Occupational Health - Occupational Stress Questionnaire    [name] : Only a little  Social Connections: Moderately Integrated (09/08/2022)   Social Connection and Isolation Panel [NHANES]    Frequency of Communication with Friends and Family: More than three times a week    Frequency of Social Gatherings with Friends and Family: More than three times a week    Attends Religious Services: More than 4 times per year    Active Member of [employer] or Organizations: Yes    Attends Banker Meetings: More than 4 times per year    Marital Status: Widowed  Intimate Partner Violence: Not on file   Family Status  Relation Name Status   Mother  Deceased   Father  Deceased   Sister [place] Deceased   Sister [place] Alive   Sister Sallye Ober Deceased   Sister Carley Hammed Deceased   Brother Hessie Knows Deceased       MI   Brother Melstone Deceased       MI   Brother Bernell Alive       throat cancer   Brother Molly Maduro Alive   Family History  Problem Relation Age of Onset   Hypertension Mother    Stroke Mother    Diabetes Mother    Colon cancer Mother 76   Cerebral aneurysm Father    Colon cancer Sister        in her 37s   CAD Sister    Heart attack Sister    Uterine cancer Sister        in her late 36s   Colon cancer Sister        in her 36s   Parkinsonism Sister    Dementia Sister    Heart attack Brother    Heart attack Brother    Throat cancer Brother    Atrial fibrillation Brother    Allergies  Allergen Reactions   Amoxicillin Swelling and Rash    Throat swells   Hydrochlorothiazide Other (See Comments)    HA and Leg cramps   Cleocin [Clindamycin Hcl] Rash    Codeine Rash    And edema   Penicillins Rash      Review of Systems  All other systems reviewed and are negative.     Objective:     BP 120/78   Pulse (!) 106   Temp 97.8 F (36.6 C)   Resp 18   Ht 5\' 4"  (1.626 m)  Wt 169 lb 14.4 oz (77.1 kg)   SpO2 97%   BMI 29.16 kg/m  BP Readings from Last 3 Encounters:  05/02/23 120/78  03/07/23 120/74  02/23/23 (!) 146/70   Wt Readings from Last 3 Encounters:  05/02/23 169 lb 14.4 oz (77.1 kg)  03/07/23 171 lb 4.8 oz (77.7 kg)  02/23/23 170 lb 14.4 oz (77.5 kg)      Physical Exam Constitutional:      Appearance: Normal appearance.  HENT:     Head: Normocephalic and atraumatic.  Eyes:     Conjunctiva/sclera: Conjunctivae normal.  Cardiovascular:     Rate and Rhythm: Normal rate. Rhythm irregular.  Pulmonary:     Effort: Pulmonary effort is normal.     Breath sounds: Normal breath sounds.  Skin:    General: Skin is warm and dry.  Neurological:     General: No focal deficit present.     Mental Status: She is alert. Mental status is at baseline.  Psychiatric:        Mood and Affect: Mood normal.        Behavior: Behavior normal.      Results for orders placed or performed in visit on 05/02/23  POCT HgB A1C  Result Value Ref Range   Hemoglobin A1C 7.6 (A) 4.0 - 5.6 %   HbA1c POC (<> result, manual entry)     HbA1c, POC (prediabetic range)     HbA1c, POC (controlled diabetic range)      Last CBC Lab Results  Component Value Date   WBC 7.0 02/23/2023   HGB 13.1 02/23/2023   HCT 40.2 02/23/2023   MCV 95.5 02/23/2023   MCH 31.1 02/23/2023   RDW 13.7 02/23/2023   PLT 273 02/23/2023   Last metabolic panel Lab Results  Component Value Date   GLUCOSE 139 (H) 11/30/2022   NA 142 11/30/2022   K 4.6 11/30/2022   CL 104 11/30/2022   CO2 31 11/30/2022   BUN 10 11/30/2022   CREATININE 0.82 11/30/2022   EGFR 70 11/30/2022   CALCIUM 9.1 11/30/2022   PROT 6.8 11/30/2022   ALBUMIN 3.4 (L) 04/27/2021    LABGLOB 2.7 04/27/2021   AGRATIO 1.3 04/27/2021   BILITOT 0.3 11/30/2022   ALKPHOS 66 04/27/2021   AST 13 11/30/2022   ALT 12 11/30/2022   ANIONGAP 10 04/27/2021   Last lipids Lab Results  Component Value Date   CHOL 108 11/30/2022   HDL 38 (L) 11/30/2022   LDLCALC 44 11/30/2022   TRIG 192 (H) 11/30/2022   CHOLHDL 2.8 11/30/2022   Last hemoglobin A1c Lab Results  Component Value Date   HGBA1C 7.6 (A) 05/02/2023   Last thyroid functions Lab Results  Component Value Date   TSH 2.070 02/26/2021   Last vitamin D No results found for: "25OHVITD2", "25OHVITD3", "VD25OH" Last vitamin B12 and Folate Lab Results  Component Value Date   VITAMINB12 461 02/23/2023   FOLATE 9.1 03/02/2022      The ASCVD Risk score (Arnett DK, et al., 2019) failed to calculate for the following reasons:   The 2019 ASCVD risk score is only valid for ages 32 to 87    Assessment & Plan:   1. Pre-op evaluation/Arthritis of left hip: EKG with atrial fibrillation and right bundle branch block, no changes compared to EKG from October 2022.  Patient is considered low cardiac risk and moderate pulmonary risk and may proceed with procedure as planned.   - COMPLETE METABOLIC PANEL WITH GFR -  CBC w/Diff/Platelet - EKG 12-Lead  2. Type 2 diabetes mellitus with diabetic polyneuropathy, with long-term current use of insulin (HCC): A1c stable at 7.6 today.  - POCT HgB A1C  Return if symptoms worsen or fail to improve.    Margarita Mail, DO

## 2023-05-02 ENCOUNTER — Ambulatory Visit (INDEPENDENT_AMBULATORY_CARE_PROVIDER_SITE_OTHER): Payer: Medicare Other | Admitting: Internal Medicine

## 2023-05-02 ENCOUNTER — Encounter: Payer: Self-pay | Admitting: Internal Medicine

## 2023-05-02 VITALS — BP 120/78 | HR 106 | Temp 97.8°F | Resp 18 | Ht 64.0 in | Wt 169.9 lb

## 2023-05-02 DIAGNOSIS — M1612 Unilateral primary osteoarthritis, left hip: Secondary | ICD-10-CM

## 2023-05-02 DIAGNOSIS — E1142 Type 2 diabetes mellitus with diabetic polyneuropathy: Secondary | ICD-10-CM

## 2023-05-02 DIAGNOSIS — Z794 Long term (current) use of insulin: Secondary | ICD-10-CM | POA: Diagnosis not present

## 2023-05-02 DIAGNOSIS — Z01818 Encounter for other preprocedural examination: Secondary | ICD-10-CM | POA: Diagnosis not present

## 2023-05-02 LAB — POCT GLYCOSYLATED HEMOGLOBIN (HGB A1C): Hemoglobin A1C: 7.6 % — AB (ref 4.0–5.6)

## 2023-05-02 LAB — CBC WITH DIFFERENTIAL/PLATELET
Absolute Monocytes: 635 cells/uL (ref 200–950)
Basophils Absolute: 61 cells/uL (ref 0–200)
Eosinophils Absolute: 270 cells/uL (ref 15–500)
Eosinophils Relative: 3.1 %
MCHC: 33.4 g/dL (ref 32.0–36.0)
Monocytes Relative: 7.3 %
Neutrophils Relative %: 62.4 %

## 2023-05-03 LAB — COMPLETE METABOLIC PANEL WITH GFR
AG Ratio: 1.7 (calc) (ref 1.0–2.5)
ALT: 15 U/L (ref 6–29)
AST: 13 U/L (ref 10–35)
Albumin: 4.3 g/dL (ref 3.6–5.1)
Alkaline phosphatase (APISO): 57 U/L (ref 37–153)
BUN: 9 mg/dL (ref 7–25)
CO2: 30 mmol/L (ref 20–32)
Calcium: 9.4 mg/dL (ref 8.6–10.4)
Chloride: 102 mmol/L (ref 98–110)
Creat: 0.74 mg/dL (ref 0.60–0.95)
Globulin: 2.5 g/dL (calc) (ref 1.9–3.7)
Glucose, Bld: 150 mg/dL — ABNORMAL HIGH (ref 65–99)
Potassium: 4.4 mmol/L (ref 3.5–5.3)
Sodium: 140 mmol/L (ref 135–146)
Total Bilirubin: 0.5 mg/dL (ref 0.2–1.2)
Total Protein: 6.8 g/dL (ref 6.1–8.1)
eGFR: 79 mL/min/{1.73_m2} (ref >=60)

## 2023-05-03 LAB — CBC WITH DIFFERENTIAL/PLATELET
Basophils Relative: 0.7 %
HCT: 39.5 % (ref 35.0–45.0)
Hemoglobin: 13.2 g/dL (ref 11.7–15.5)
Lymphs Abs: 2306 cells/uL (ref 850–3900)
MCH: 30.8 pg (ref 27.0–33.0)
MCV: 92.1 fL (ref 80.0–100.0)
MPV: 11.2 fL (ref 7.5–12.5)
Neutro Abs: 5429 cells/uL (ref 1500–7800)
Platelets: 284 10*3/uL (ref 140–400)
RBC: 4.29 10*6/uL (ref 3.80–5.10)
RDW: 12.7 % (ref 11.0–15.0)
Total Lymphocyte: 26.5 %
WBC: 8.7 10*3/uL (ref 3.8–10.8)

## 2023-05-07 NOTE — Progress Notes (Signed)
Patient canceled appointment 

## 2023-05-11 ENCOUNTER — Ambulatory Visit
Admission: RE | Admit: 2023-05-11 | Discharge: 2023-05-11 | Disposition: A | Discharge status: Home / Self Care | Payer: Medicare Other | Source: Ambulatory Visit | Attending: Internal Medicine | Admitting: Internal Medicine

## 2023-05-11 DIAGNOSIS — Z1231 Encounter for screening mammogram for malignant neoplasm of breast: Secondary | ICD-10-CM | POA: Insufficient documentation

## 2023-05-19 ENCOUNTER — Other Ambulatory Visit: Payer: Self-pay | Admitting: Internal Medicine

## 2023-05-19 ENCOUNTER — Ambulatory Visit (INDEPENDENT_AMBULATORY_CARE_PROVIDER_SITE_OTHER): Payer: Medicare Other

## 2023-05-19 DIAGNOSIS — E1142 Type 2 diabetes mellitus with diabetic polyneuropathy: Secondary | ICD-10-CM

## 2023-05-19 DIAGNOSIS — E538 Deficiency of other specified B group vitamins: Secondary | ICD-10-CM

## 2023-05-19 MED ORDER — CYANOCOBALAMIN 1000 MCG/ML IJ SOLN
1000.0000 ug | Freq: Once | INTRAMUSCULAR | Status: AC
Start: 2023-05-19 — End: 2023-05-19
  Administered 2023-05-19: 1000 ug via INTRAMUSCULAR

## 2023-05-22 ENCOUNTER — Telehealth: Payer: Self-pay | Admitting: Cardiovascular Disease

## 2023-05-22 NOTE — Telephone Encounter (Signed)
Called pt back to let her know this message was sent to the provider and his nurse for further information. Pt verbalized understanding. This has been sent to the pre-op team.

## 2023-05-22 NOTE — Telephone Encounter (Signed)
Pt states that [facility] has not received the medical clearance done on 6/25 and she would like a callback regarding this matter. Please advise.

## 2023-05-22 NOTE — Telephone Encounter (Signed)
Requested Prescriptions  Pending Prescriptions Disp Refills   LANTUS 100 UNIT/ML injection [Pharmacy Med Name: Lantus 100 UNIT/ML Subcutaneous Solution] 10 mL 0    Sig: INJECT 24 TO 26 UNITS SUBCUTANEOUSLY AT BEDTIME     Endocrinology:  Diabetes - Insulins Passed - 05/19/2023 11:09 PM      Passed - HBA1C is between 0 and 7.9 and within 180 days    Hemoglobin A1C  Date Value Ref Range Status  05/02/2023 7.6 (A) 4.0 - 5.6 % Final   Hgb A1c MFr Bld  Date Value Ref Range Status  11/30/2022 8.0 (H) <5.7 % of total Hgb Final    Comment:    For someone without known diabetes, a hemoglobin A1c value of 6.5% or greater indicates that they may have  diabetes and this should be confirmed with a follow-up  test. . For someone with known diabetes, a value <7% indicates  that their diabetes is well controlled and a value  greater than or equal to 7% indicates suboptimal  control. A1c targets should be individualized based on  duration of diabetes, age, comorbid conditions, and  other considerations. . Currently, no consensus exists regarding use of hemoglobin A1c for diagnosis of diabetes for children. Verna Czech - Valid encounter within last 6 months    Recent Outpatient Visits           2 weeks ago Pre-op evaluation   [facility] Margarita Mail, DO   2 months ago Type 2 diabetes mellitus with diabetic polyneuropathy, with long-term current use of insulin [facility])   [facility] Margarita Mail, DO   5 months ago Diabetic peripheral neuropathy associated with type 2 diabetes mellitus [facility])   [facility] Margarita Mail, DO   8 months ago Type 2 diabetes mellitus with hyperglycemia, with long-term current use of insulin [facility])   [facility] Margarita Mail, DO   9 months ago Bleeding external hemorrhoids   [facility] Margarita Mail,  DO       Future Appointments             In 1 week [place] Lucinda Dell  [facility] [place], [place]   In 2 weeks Margarita Mail, DO [facility], [facility]   In 3 months  [facility], [facility]

## 2023-05-22 NOTE — Telephone Encounter (Signed)
I s/w the pt and assured her that the clearance was faxed over on 04/18/23 by Edd Fabian, FNP. I will refax notes today. Pt thanked me for the help and the call.

## 2023-05-25 ENCOUNTER — Ambulatory Visit: Payer: Medicare Other | Admitting: Internal Medicine

## 2023-05-30 ENCOUNTER — Ambulatory Visit: Payer: Medicare Other | Admitting: Internal Medicine

## 2023-06-01 ENCOUNTER — Ambulatory Visit (INDEPENDENT_AMBULATORY_CARE_PROVIDER_SITE_OTHER): Payer: Medicare Other | Admitting: Podiatry

## 2023-06-01 ENCOUNTER — Encounter: Payer: Self-pay | Admitting: Podiatry

## 2023-06-01 DIAGNOSIS — M2042 Other hammer toe(s) (acquired), left foot: Secondary | ICD-10-CM

## 2023-06-01 DIAGNOSIS — M2041 Other hammer toe(s) (acquired), right foot: Secondary | ICD-10-CM

## 2023-06-01 DIAGNOSIS — M79675 Pain in left toe(s): Secondary | ICD-10-CM | POA: Diagnosis not present

## 2023-06-01 DIAGNOSIS — E1142 Type 2 diabetes mellitus with diabetic polyneuropathy: Secondary | ICD-10-CM

## 2023-06-01 DIAGNOSIS — E119 Type 2 diabetes mellitus without complications: Secondary | ICD-10-CM

## 2023-06-01 DIAGNOSIS — B351 Tinea unguium: Secondary | ICD-10-CM | POA: Diagnosis not present

## 2023-06-01 DIAGNOSIS — L84 Corns and callosities: Secondary | ICD-10-CM

## 2023-06-01 DIAGNOSIS — M79674 Pain in right toe(s): Secondary | ICD-10-CM | POA: Diagnosis not present

## 2023-06-01 NOTE — Progress Notes (Signed)
Established Patient Office Visit  Subjective:  Patient ID: Danielle [name], female    DOB: 05/15/1935  Age: 88 y.o. MRN: [id number]  CC:  Chief Complaint  Patient presents with   Follow-up    HPI TAILYNN SYVERSON here for follow up on chronic medical conditions.  Patient is scheduled for hip surgery now September 19.    Patient states for the last 5 days she has been having on and off right flank pain.  She is worried this is her kidneys.  She denies any urinary symptoms, no dysuria, hematuria or increased urinary urgency or frequency.  She denies fevers but has been feeling more tired lately.  Hypertension/A.Fib: -Medications: Diltiazem 120 mg, Metoprolol 50 mg, Xarelto 20 mg -Follows with Cardiology, last seen 04/18/2023 -Patient is compliant with above medications and reports no side effects. -Denies any SOB, CP, vision changes, LE edema or symptoms of hypotension.  Does have some mild fatigue.  HLD: -Medications: Lipitor 10 mg -Patient is compliant with above medications and reports no side effects.  -Last lipid panel:  Lipid Panel     Component Value Date/Time   CHOL 108 11/30/2022 0951   TRIG 192 (H) 11/30/2022 0951   HDL 38 (L) 11/30/2022 0951   CHOLHDL 2.8 11/30/2022 0951   VLDL 45 (H) 11/10/2008 0317   LDLCALC 44 11/30/2022 0951    Diabetes, Type 2 with Neuropathy: -Last A1c 7.6% 7/24 -Medications: Metformin 1000 mg BID, Lantus 24-26 units at night, Gabapentin 300 mg TID -Patient is compliant with the above medications and reports no side effects.  -Checking BG at home: fasting 78-135 -Eye exam: UTD 9/23 -Foot exam: Follows with Podiatry, just seen on 06/01/2023 -Microalbumin: UTD 2/24 -Statin: yes -PNA vaccine: UTD -Denies symptoms of hypoglycemia, polyuria, polydipsia, numbness extremities, foot ulcers/trauma.   Iron Deficiency Anemia/Vitamin B12 deficiency: -Had been on IV iron in the past, but not on any more -Following with Hematology, following in November  to repeat labs -Currently on IM Vitamin B12 injection every 4 weeks   GERD: -Currently on Prilosec 40, doing well  Sensorineural Hearing Loss in left ear due to COVID: -Following with [facility], note from 01/25/22 reviewed, plan on repeating hearing test and continuing hearing aids.  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  -Having more balance changes with worsening tinnitus bilaterally (worse on left). No ear pain/pressure. No recent viral illness, does have some post-nasal drip.  Gout: -Recently had a flare a few weeks ago in the first digit of her left foot.  She took colchicine and her symptoms resolved -Has had a total of 4 flares  Health maintenance: -Blood work UTD -Mammogram 7/24 Birads-1 - will discuss next summer if the patient would like to continue with screening  -Colonoscopy 6/22, repeat every 5 years   Past Medical History:  Diagnosis Date   Anemia    Anxiety    Arrhythmia    Arthritis    Atrial fibrillation (HCC)    Cataract    bilateral   Colon polyps    Diabetes (HCC)    Diabetes mellitus without complication (HCC)    type two   GERD (gastroesophageal reflux disease)    Hypercholesteremia    Hypertension    Iron deficiency anemia    Pancreatitis    Peripheral neuropathy    Squamous cell skin  cancer     Past Surgical History:  Procedure Laterality Date   APPENDECTOMY  1957   BREAST EXCISIONAL BIOPSY Right 1966   CATARACT EXTRACTION, BILATERAL     CHOLECYSTECTOMY  10/2008   lump removed right breast  1958   bENIGN   right ankle plate  1610   VAGINAL HYSTERECTOMY  1978    Family History  Problem Relation Age of Onset   Hypertension Mother    Stroke Mother    Diabetes Mother    Colon cancer Mother 2   Cerebral aneurysm Father    Colon cancer Sister        in her  68s   CAD Sister    Heart attack Sister    Uterine cancer Sister        in her late 80s   Colon cancer Sister        in her 9s   Parkinsonism Sister    Dementia Sister    Heart attack Brother    Heart attack Brother    Throat cancer Brother    Atrial fibrillation Brother    Breast cancer Neg Hx     Social History   Socioeconomic History   Marital status: Widowed    Spouse name: Not on file   Number of children: 3   Years of education: Not on file   Highest education level: Not on file  Occupational History   Occupation: Retired  Tobacco Use   Smoking status: Never   Smokeless tobacco: Never  Vaping Use   Vaping status: Never Used  Substance and Sexual Activity   Alcohol use: No    Alcohol/week: 0.0 standard drinks of alcohol   Drug use: No   Sexual activity: Not Currently  Other Topics Concern   Not on file  Social History Narrative   Not on file   Social Determinants of Health   Financial Resource Strain: Low Risk  (09/08/2022)   Overall Financial Resource Strain (CARDIA)    Difficulty of Paying Living Expenses: Not very hard  Food Insecurity: No Food Insecurity (09/08/2022)   Hunger Vital Sign    Worried About Running Out of Food in the Last Year: Never true    Ran Out of Food in the Last Year: Never true  Transportation Needs: No Transportation Needs (09/08/2022)   PRAPARE - Administrator, Civil Service (Medical): No    Lack of Transportation (Non-Medical): No  Physical Activity: Inactive (09/08/2022)   Exercise Vital Sign    Days of Exercise per Week: 0 days    Minutes of Exercise per Session: 0 min  Stress: No Stress Concern Present (09/08/2022)   [employer] of Occupational Health - Occupational Stress Questionnaire    [name] : Only a little  Social Connections: Moderately Integrated (09/08/2022)   Social Connection and Isolation Panel [NHANES]    Frequency of Communication with Friends and Family: More than three  times a week    Frequency of Social Gatherings with Friends and Family: More than three times a week    Attends Religious Services: More than 4 times per year    Active Member of [employer] or Organizations: Yes    Attends Banker Meetings: More than 4 times per year    Marital Status: Widowed  Intimate Partner Violence: Not on file    ROS Review of Systems  Constitutional:  Positive for fatigue. Negative for chills and fever.  HENT:  Positive for hearing loss. Negative  for ear pain.   Eyes:  Negative for visual disturbance.  Respiratory:  Negative for cough and shortness of breath.   Cardiovascular:  Negative for chest pain and palpitations.  Gastrointestinal:  Negative for abdominal pain.  Genitourinary:  Positive for flank pain. Negative for dysuria, frequency and hematuria.  Neurological:  Negative for dizziness and headaches.    Objective:   Today's Vitals: BP 128/78   Pulse 93   Temp 98.1 F (36.7 C)   Resp 16   Ht 5\' 4"  (1.626 m)   Wt 169 lb 9.6 oz (76.9 kg)   SpO2 98%   BMI 29.11 kg/m   Physical Exam Constitutional:      Appearance: Normal appearance.  HENT:     Head: Normocephalic and atraumatic.  Eyes:     Conjunctiva/sclera: Conjunctivae normal.  Cardiovascular:     Rate and Rhythm: Normal rate and regular rhythm.  Pulmonary:     Effort: Pulmonary effort is normal.     Breath sounds: Normal breath sounds.  Abdominal:     Tenderness: There is no right CVA tenderness or left CVA tenderness.  Musculoskeletal:     Right lower leg: No edema.     Left lower leg: No edema.  Skin:    General: Skin is warm and dry.  Neurological:     General: No focal deficit present.     Mental Status: She is alert. Mental status is at baseline.  Psychiatric:        Mood and Affect: Mood normal.        Behavior: Behavior normal.     Assessment & Plan:   1. Right flank pain: Urine in the office negative for signs of infection.  - POCT Urinalysis  Dipstick  2. Essential hypertension/Atrial fibrillation, unspecified type [facility]): Blood pressure stable, no changes to medications made.  3. Type 2 diabetes mellitus with diabetic polyneuropathy, with long-term current use of insulin (HCC): Last A1c improved to 7.6%.  Continue metformin at 1000 mg twice daily, Lantus 24 to 26 units at night, plan to recheck A1c in 2 months.   Follow-up: Return in about 2 months (around 08/06/2023).   Margarita Mail, DO

## 2023-06-01 NOTE — Progress Notes (Signed)
Subjective: Danielle [name] presents today for diabetic foot evaluation.  Patient relates >25 year h/o diabetes. She has h/o ankle fx RLE and has plate in right fibula.  Patient denies any h/o foot wounds.  Patient has been diagnosed with neuropathy and takes gabapentin.  Risk factors: diabetes, diabetic neuropathy, history of gout, HTN, hyperlipidemia.  PCP is Margarita Mail, DO , and last visit was May 02, 2023.  Patient states she is scheduled to have left hip surgery on July 13, 2023.  Past Medical History:  Diagnosis Date   Anemia    Anxiety    Arrhythmia    Arthritis    Atrial fibrillation (HCC)    Cataract    bilateral   Colon polyps    Diabetes (HCC)    Diabetes mellitus without complication (HCC)    type two   GERD (gastroesophageal reflux disease)    Hypercholesteremia    Hypertension    Iron deficiency anemia    Pancreatitis    Peripheral neuropathy    Squamous cell skin cancer     Patient Active Problem List   Diagnosis Date Noted   Anxiety disorder 04/21/2022   Body mass index (BMI) 31.0-31.9, adult 04/21/2022   Hearing loss 04/21/2022   Psychophysiologic insomnia 04/21/2022   Screening for malignant neoplasm of colon 04/21/2022   Type 2 diabetes mellitus with hyperglycemia (HCC) 04/21/2022   History of gout 02/24/2022   Atrial fibrillation (HCC) 11/26/2021   Decreased estrogen level 11/26/2021   Diabetic peripheral neuropathy associated with type 2 diabetes mellitus (HCC) 11/26/2021   Essential hypertension 11/26/2021   Disorder of bone 11/26/2021   Long term (current) use of insulin (HCC) 11/26/2021   Mixed hyperlipidemia 11/26/2021   GERD (gastroesophageal reflux disease) 11/26/2021   Vitamin B12 deficient megaloblastic anemia 09/08/2021   Iron deficiency anemia due to chronic blood loss 04/27/2021   Asymmetric SNHL (sensorineural hearing loss) 12/05/2018   Tinnitus of both ears 12/05/2018    Past Surgical History:  Procedure  Laterality Date   APPENDECTOMY  1957   BREAST EXCISIONAL BIOPSY Right 1966   CATARACT EXTRACTION, BILATERAL     CHOLECYSTECTOMY  10/2008   lump removed right breast  1958   bENIGN   right ankle plate  [id number]   VAGINAL HYSTERECTOMY  1978    Current Outpatient Medications on File Prior to Visit  Medication Sig Dispense Refill   Accu-Chek Softclix Lancets lancets Use as instructed 100 each 12   atorvastatin (LIPITOR) 10 MG tablet Take 1 tablet by mouth once daily 90 tablet 1   Blood Glucose Monitoring Suppl (ACCU-CHEK GUIDE) w/Device KIT DxE11.65 Check BG tid 1 kit 0   calcium carbonate (OSCAL) 1500 (600 Ca) MG TABS tablet Take 600 mg of elemental calcium by mouth 2 (two) times daily with a meal.     Cholecalciferol (VITAMIN D3) 1000 UNITS CAPS Take 1,000 Units by mouth in the morning and at bedtime.     colchicine 0.6 MG tablet Take 1 tablet by mouth once daily 20 tablet 0   cyanocobalamin (,VITAMIN B-12,) 1000 MCG/ML injection Inject into the muscle.     diltiazem (DILACOR XR) 120 MG 24 hr capsule Take 1 capsule (120 mg total) by mouth daily. 90 capsule 3   gabapentin (NEURONTIN) 300 MG capsule TAKE 3 CAPSULES BY MOUTH AT BEDTIME 270 capsule 3   Glucosamine-Chondroit-Vit C-Mn (GLUCOSAMINE CHONDR 1500 COMPLX PO) Take 1 capsule by mouth in the morning and at bedtime.     glucose blood (ACCU-CHEK  GUIDE) test strip Use as instructed 100 each 12   Insulin Pen Needle (PEN NEEDLES) 31G X 8 MM MISC 1 each by Does not apply route 2 (two) times daily. 100 each 3   Insulin Syringe-Needle U-100 (B-D INS SYR ULTRAFINE .5CC/30G) 30G X 1/2" 0.5 ML MISC 1 each by Does not apply route daily. 100 each 3   LANTUS 100 UNIT/ML injection INJECT 24 TO 26 UNITS SUBCUTANEOUSLY AT BEDTIME 10 mL 0   metFORMIN (GLUCOPHAGE) 1000 MG tablet Take 1 tablet (1,000 mg total) by mouth 2 (two) times daily. 180 tablet 1   metoprolol succinate (TOPROL-XL) 50 MG 24 hr tablet TAKE 1 TABLET BY MOUTH TWICE DAILY (MORNING  AT  AT   BEDTIME)  WITH  OR  IMMEDIATELY  FOLLOWING  A  MEAL 180 tablet 3   omeprazole (PRILOSEC) 40 MG capsule Take 1 capsule (40 mg total) by mouth daily. 90 capsule 1   rivaroxaban (XARELTO) 20 MG TABS tablet TAKE 1 TABLET BY MOUTH ONCE DAILY WITH SUPPER 90 tablet 1   No current facility-administered medications on file prior to visit.     Allergies  Allergen Reactions   Amoxicillin Swelling and Rash    Throat swells   Hydrochlorothiazide Other (See Comments)    HA and Leg cramps   Cleocin [Clindamycin Hcl] Rash   Codeine Rash    And edema   Penicillins Rash    Social History   Occupational History   Occupation: Retired  Tobacco Use   Smoking status: Never   Smokeless tobacco: Never  Vaping Use   Vaping status: Never Used  Substance and Sexual Activity   Alcohol use: No    Alcohol/week: 0.0 standard drinks of alcohol   Drug use: No   Sexual activity: Not Currently    Family History  Problem Relation Age of Onset   Hypertension Mother    Stroke Mother    Diabetes Mother    Colon cancer Mother 85   Cerebral aneurysm Father    Colon cancer Sister        in her 13s   CAD Sister    Heart attack Sister    Uterine cancer Sister        in her late 64s   Colon cancer Sister        in her 16s   Parkinsonism Sister    Dementia Sister    Heart attack Brother    Heart attack Brother    Throat cancer Brother    Atrial fibrillation Brother    Breast cancer Neg Hx     Immunization History  Administered Date(s) Administered   Fluad Quad(high Dose 65+) 08/23/2022   Influenza Split 09/29/2007, 10/22/2008, 06/30/2009, 07/27/2011, 10/09/2013   Influenza, High Dose Seasonal PF 08/30/2021   Influenza,inj,Quad PF,6+ Mos 08/03/2016, 08/07/2017, 08/22/2018, 08/27/2019, 08/18/2020   Influenza,inj,quad, With Preservative 07/22/2015   PFIZER(Purple Top)SARS-COV-2 Vaccination 01/24/2020, 02/14/2020   PNEUMOCOCCAL CONJUGATE-20 11/30/2022   Pneumococcal Conjugate-13 01/03/2014    Pneumococcal Polysaccharide-23 05/10/2005   Td 10/07/2002   Tdap 11/16/2011    Objective: There were no vitals filed for this visit.  Danielle [name] is a pleasant 88 y.o. female WD, WN in NAD. AAO X 3.  Vascular Examination: Capillary refill time immediate b/l. Vascular status intact b/l with palpable DP pulses; faintly palpable PT pulses. Pedal hair absent b/l. No edema. No pain with calf compression b/l. Skin temperature gradient WNL b/l. No cyanosis or clubbing noted b/l. No ischemia or gangrene b/l.  Spider veins present b/l.  Neurological Examination: Pt has subjective symptoms of neuropathy. Sensation grossly intact b/l with 10 gram monofilament. Vibratory sensation intact b/l.   Dermatological Examination: Pedal skin with normal turgor, texture and tone b/l.  No open wounds. No interdigital macerations.   Toenails 1-5 b/l thick, discolored, elongated with subungual debris and pain on dorsal palpation.   Hyperkeratotic lesion(s) submet head 1 left foot.  No erythema, no edema, no drainage, no fluctuance. Preulcerative lesion noted submet head 1 right foot. There is visible subdermal hemorrhage. There is no surrounding erythema, no edema, no drainage, no odor, no fluctuance.  Musculoskeletal Examination: Hallux hammertoe noted b/l LE.  Radiographs: None  Last A1c:      Latest Ref Rng & Units 05/02/2023    3:23 PM 11/30/2022    9:51 AM 08/30/2022    9:22 AM  Hemoglobin A1C  Hemoglobin-A1c 4.0 - 5.6 % 7.6  8.0  7.5    Assessment: 1. Pain due to onychomycosis of toenails of both feet   2. Acquired hammertoes of both feet   3. Pre-ulcerative calluses   4. Diabetic peripheral neuropathy associated with type 2 diabetes mellitus (HCC)   5. Encounter for diabetic foot exam (HCC)     ADA Risk Categorization: Low Risk:  Patient has all of the following: Intact protective sensation No prior foot ulcer  No severe deformity Pedal pulses present  Plan: -Patient was evaluated  and treated. All patient's and/or POA's questions/concerns answered on today's visit. -Patient educated on issues such as foreign body, ankle sprain, wounds, infected ingrown toenails, toe injuries which may require an acute visit and advised to call office immediately should she encounter any of these. When in doubt, call our office. She related understanding. -Diabetic foot examination performed today. -Discussed and educated patient on diabetic foot care, especially with  regards to the vascular, neurological and musculoskeletal systems. -Patient to continue soft, supportive shoe gear daily. -Toenails 1-5 b/l were debrided in length and girth with sterile nail nippers and dremel without iatrogenic bleeding.  -Callus(es) submet head 1 left foot pared utilizing sterile scalpel blade without complication or incident. Total number debrided =1. -Preulcerative lesion pared submet head 1 right foot utilizing sterile scalpel blade. Total number pared=1. -Applied felt offloading pad to right shoe for preulcerative lesion. -Patient/POA to call should there be question/concern in the interim.  Return in about 3 months (around 09/01/2023).  Freddie Breech, DPM

## 2023-06-06 ENCOUNTER — Encounter: Payer: Self-pay | Admitting: Internal Medicine

## 2023-06-06 ENCOUNTER — Ambulatory Visit (INDEPENDENT_AMBULATORY_CARE_PROVIDER_SITE_OTHER): Payer: Medicare Other | Admitting: Internal Medicine

## 2023-06-06 VITALS — BP 128/78 | HR 93 | Temp 98.1°F | Resp 16 | Ht 64.0 in | Wt 169.6 lb

## 2023-06-06 DIAGNOSIS — I4891 Unspecified atrial fibrillation: Secondary | ICD-10-CM | POA: Diagnosis not present

## 2023-06-06 DIAGNOSIS — R109 Unspecified abdominal pain: Secondary | ICD-10-CM

## 2023-06-06 DIAGNOSIS — Z794 Long term (current) use of insulin: Secondary | ICD-10-CM | POA: Diagnosis not present

## 2023-06-06 DIAGNOSIS — R10A1 Flank pain, right side: Secondary | ICD-10-CM

## 2023-06-06 DIAGNOSIS — I10 Essential (primary) hypertension: Secondary | ICD-10-CM | POA: Diagnosis not present

## 2023-06-06 DIAGNOSIS — E1142 Type 2 diabetes mellitus with diabetic polyneuropathy: Secondary | ICD-10-CM | POA: Diagnosis not present

## 2023-06-06 LAB — POCT URINALYSIS DIPSTICK
Bilirubin, UA: NEGATIVE
Blood, UA: NEGATIVE
Glucose, UA: NEGATIVE
Ketones, UA: NEGATIVE
Leukocytes, UA: NEGATIVE
Nitrite, UA: NEGATIVE
Odor: NORMAL
Protein, UA: NEGATIVE
Spec Grav, UA: 1.02 (ref 1.010–1.025)
Urobilinogen, UA: 0.2 E.U./dL
pH, UA: 7 (ref 5.0–8.0)

## 2023-06-15 ENCOUNTER — Ambulatory Visit (INDEPENDENT_AMBULATORY_CARE_PROVIDER_SITE_OTHER): Payer: Medicare Other

## 2023-06-15 DIAGNOSIS — E538 Deficiency of other specified B group vitamins: Secondary | ICD-10-CM | POA: Diagnosis not present

## 2023-06-15 MED ORDER — CYANOCOBALAMIN 1000 MCG/ML IJ SOLN
1000.0000 ug | Freq: Once | INTRAMUSCULAR | Status: AC
Start: 2023-06-15 — End: 2023-06-15
  Administered 2023-06-15: 1000 ug via INTRAMUSCULAR

## 2023-06-16 ENCOUNTER — Ambulatory Visit: Payer: Medicare Other

## 2023-06-16 ENCOUNTER — Ambulatory Visit: Payer: Self-pay | Admitting: Student

## 2023-06-16 DIAGNOSIS — Z794 Long term (current) use of insulin: Secondary | ICD-10-CM

## 2023-06-23 NOTE — Patient Instructions (Addendum)
SURGICAL WAITING ROOM VISITATION Patients having surgery or a procedure may have no more than 2 support people in the waiting area - these visitors may rotate.    Children under the age of 72 must have an adult with them who is not the patient.  If the patient needs to stay at the hospital during part of their recovery, the visitor guidelines for inpatient rooms apply. Pre-op nurse will coordinate an appropriate time for 1 support person to accompany patient in pre-op.  This support person may not rotate.    Please refer to the [facility] website for the visitor guidelines for Inpatients (after your surgery is over and you are in a regular room).       Your procedure is scheduled on: 07-13-23   Report to [facility] Main Entrance    Report to admitting at 10:20 AM   Call this number if you have problems the morning of surgery [phone]   Do not eat food :After Midnight.   After Midnight you may have the following liquids until 9:50 AM DAY OF SURGERY  Water Non-Citrus Juices (without pulp, NO RED-Apple, White grape, White cranberry) Black Coffee (NO MILK/CREAM OR CREAMERS, sugar ok)  Clear Tea (NO MILK/CREAM OR CREAMERS, sugar ok) regular and decaf                             Plain Jell-O (NO RED)                                           Fruit ices (not with fruit pulp, NO RED)                                     Popsicles (NO RED)                                                               Sports drinks like Gatorade (NO RED)                   The day of surgery:  Drink ONE (1) Pre-Surgery G2 by 9:50 AM the morning of surgery. Drink in one sitting. Do not sip.  This drink was given to you during your hospital  pre-op appointment visit. Nothing else to drink after completing the Pre-Surgery G2.          If you have questions, please contact your surgeon's office.   FOLLOW  ANY ADDITIONAL PRE OP INSTRUCTIONS YOU RECEIVED FROM YOUR SURGEON'S OFFICE!!!      Oral Hygiene is also important to reduce your risk of infection.                                    Remember - BRUSH YOUR TEETH THE MORNING OF SURGERY WITH YOUR REGULAR TOOTHPASTE   Do NOT smoke after Midnight   Take these medicines the morning of surgery with A SIP OF WATER:   Atorvastatin  Colchicine  Diltiazem  Gabapentin  Metoprolol  Omeprazole  Stop all vitamins and herbal supplements 7 days before surgery (do not take after 07-05-23)  Hold Xarelto 3 days before surgery (do not take after 07-09-23)  How to Manage Your Diabetes Before and After Surgery  Why is it important to control my blood sugar before and after surgery? Improving blood sugar levels before and after surgery helps healing and can limit problems. A way of improving blood sugar control is eating a healthy diet by:  Eating less sugar and carbohydrates  Increasing activity/exercise  Talking with your doctor about reaching your blood sugar goals High blood sugars (greater than 180 mg/dL) can raise your risk of infections and slow your recovery, so you will need to focus on controlling your diabetes during the weeks before surgery. Make sure that the doctor who takes care of your diabetes knows about your planned surgery including the date and location.  How do I manage my blood sugar before surgery? Check your blood sugar at least 4 times a day, starting 2 days before surgery, to make sure that the level is not too high or low. Check your blood sugar the morning of your surgery when you wake up and every 2 hours until you get to the Short Stay unit. If your blood sugar is less than 70 mg/dL, you will need to treat for low blood sugar: Do not take insulin. Treat a low blood sugar (less than 70 mg/dL) with  cup of clear juice (cranberry or apple), 4 glucose tablets, OR glucose gel. Recheck blood sugar in 15 minutes after treatment (to make sure it is greater than 70 mg/dL). If your blood sugar is not greater than  70 mg/dL on recheck, call [phone] for further instructions. Report your blood sugar to the short stay nurse when you get to Short Stay.  If you are admitted to the hospital after surgery: Your blood sugar will be checked by the staff and you will probably be given insulin after surgery (instead of oral diabetes medicines) to make sure you have good blood sugar levels. The goal for blood sugar control after surgery is 80-180 mg/dL.   WHAT DO I DO ABOUT MY DIABETES MEDICATION?  Do not take oral diabetes medicines (pills) the morning of surgery.  THE NIGHT BEFORE SURGERY, take 1/2 dosage of Lantus insulin.       DO NOT TAKE THE FOLLOWING 7 DAYS PRIOR TO SURGERY: Ozempic, Wegovy, Rybelsus (Semaglutide), Byetta (exenatide), Bydureon (exenatide ER), Victoza, Saxenda (liraglutide), or Trulicity (dulaglutide) Mounjaro (Tirzepatide) Adlyxin (Lixisenatide), Polyethylene Glycol Loxenatide.  Reviewed and Endorsed by [facility] Patient Education Committee, August 2015                              You may not have any metal on your body including hair pins, jewelry, and body piercing             Do not wear make-up, lotions, powders, perfumes or deodorant  Do not wear nail polish including gel and S&S, artificial/acrylic nails, or any other type of covering on natural nails including finger and toenails. If you have artificial nails, gel coating, etc. that needs to be removed by a nail salon please have this removed prior to surgery or surgery may need to be canceled/ delayed if the surgeon/ anesthesia feels like they are unable to be safely monitored.   Do not shave  48 hours prior to surgery.  Do not bring valuables to the hospital. [facility] IS NOT RESPONSIBLE   FOR VALUABLES.   Contacts, dentures or bridgework may not be worn into surgery.   Bring small overnight bag day of surgery.   DO NOT BRING YOUR HOME MEDICATIONS TO THE HOSPITAL. PHARMACY WILL DISPENSE MEDICATIONS LISTED ON  YOUR MEDICATION LIST TO YOU DURING YOUR ADMISSION IN THE HOSPITAL!    Special Instructions: Bring a copy of your healthcare power of attorney and living will documents the day of surgery if you haven't scanned them before.              Please read over the following fact sheets you were given: IF YOU HAVE QUESTIONS ABOUT YOUR PRE-OP INSTRUCTIONS PLEASE CALL [phone] Gwen  If you received a COVID test during your pre-op visit  it is requested that you wear a mask when out in public, stay away from anyone that may not be feeling well and notify your surgeon if you develop symptoms. If you test positive for Covid or have been in contact with anyone that has tested positive in the last 10 days please notify you surgeon.    Pre-operative 5 CHG Bath Instructions   You can play a key role in reducing the risk of infection after surgery. Your skin needs to be as free of germs as possible. You can reduce the number of germs on your skin by washing with CHG (chlorhexidine gluconate) soap before surgery. CHG is an antiseptic soap that kills germs and continues to kill germs even after washing.   DO NOT use if you have an allergy to chlorhexidine/CHG or antibacterial soaps. If your skin becomes reddened or irritated, stop using the CHG and notify one of our RNs at [phone].   Please shower with the CHG soap starting 4 days before surgery using the following schedule:     Please keep in mind the following:  DO NOT shave, including legs and underarms, starting the day of your first shower.   You may shave your face at any point before/day of surgery.  Place clean sheets on your bed the day you start using CHG soap. Use a clean washcloth (not used since being washed) for each shower. DO NOT sleep with pets once you start using the CHG.   CHG Shower Instructions:  If you choose to wash your hair and private area, wash first with your normal shampoo/soap.  After you use shampoo/soap, rinse your  hair and body thoroughly to remove shampoo/soap residue.  Turn the water OFF and apply about 3 tablespoons (45 ml) of CHG soap to a CLEAN washcloth.  Apply CHG soap ONLY FROM YOUR NECK DOWN TO YOUR TOES (washing for 3-5 minutes)  DO NOT use CHG soap on face, private areas, open wounds, or sores.  Pay special attention to the area where your surgery is being performed.  If you are having back surgery, having someone wash your back for you may be helpful. Wait 2 minutes after CHG soap is applied, then you may rinse off the CHG soap.  Pat dry with a clean towel  Put on clean clothes/pajamas   If you choose to wear lotion, please use ONLY the CHG-compatible lotions on the back of this paper.     Additional instructions for the day of surgery: DO NOT APPLY any lotions, deodorants, cologne, or perfumes.   Put on clean/comfortable clothes.  Brush your teeth.  Ask your nurse before applying any prescription medications to the skin.  CHG Compatible Lotions   Aveeno Moisturizing lotion  Cetaphil Moisturizing Cream  Cetaphil Moisturizing Lotion  Clairol Herbal Essence Moisturizing Lotion, Dry Skin  Clairol Herbal Essence Moisturizing Lotion, Extra Dry Skin  Clairol Herbal Essence Moisturizing Lotion, Normal Skin  Curel Age Defying Therapeutic Moisturizing Lotion with Alpha Hydroxy  Curel Extreme Care Body Lotion  Curel Soothing Hands Moisturizing Hand Lotion  Curel Therapeutic Moisturizing Cream, Fragrance-Free  Curel Therapeutic Moisturizing Lotion, Fragrance-Free  Curel Therapeutic Moisturizing Lotion, Original Formula  Eucerin Daily Replenishing Lotion  Eucerin Dry Skin Therapy Plus Alpha Hydroxy Crme  Eucerin Dry Skin Therapy Plus Alpha Hydroxy Lotion  Eucerin Original Crme  Eucerin Original Lotion  Eucerin Plus Crme Eucerin Plus Lotion  Eucerin TriLipid Replenishing Lotion  Keri Anti-Bacterial Hand Lotion  Keri Deep Conditioning Original Lotion Dry Skin Formula Softly  Scented  Keri Deep Conditioning Original Lotion, Fragrance Free Sensitive Skin Formula  Keri Lotion Fast Absorbing Fragrance Free Sensitive Skin Formula  Keri Lotion Fast Absorbing Softly Scented Dry Skin Formula  Keri Original Lotion  Keri Skin Renewal Lotion Keri Silky Smooth Lotion  Keri Silky Smooth Sensitive Skin Lotion  Nivea Body Creamy Conditioning Oil  Nivea Body Extra Enriched Lotion  Nivea Body Original Lotion  Nivea Body Sheer Moisturizing Lotion Nivea Crme  Nivea Skin Firming Lotion  NutraDerm 30 Skin Lotion  NutraDerm Skin Lotion  NutraDerm Therapeutic Skin Cream  NutraDerm Therapeutic Skin Lotion  ProShield Protective Hand Cream  Provon moisturizing lotion   PATIENT SIGNATURE_________________________________  NURSE SIGNATURE__________________________________  ________________________________________________________________________    Danielle [name]  An incentive spirometer is a tool that can help keep your lungs clear and active. This tool measures how well you are filling your lungs with each breath. Taking long deep breaths may help reverse or decrease the chance of developing breathing (pulmonary) problems (especially infection) following: A long period of time when you are unable to move or be active. BEFORE THE PROCEDURE  If the spirometer includes an indicator to show your best effort, your nurse or respiratory therapist will set it to a desired goal. If possible, sit up straight or lean slightly forward. Try not to slouch. Hold the incentive spirometer in an upright position. INSTRUCTIONS FOR USE  Sit on the edge of your bed if possible, or sit up as far as you can in bed or on a chair. Hold the incentive spirometer in an upright position. Breathe out normally. Place the mouthpiece in your mouth and seal your lips tightly around it. Breathe in slowly and as deeply as possible, raising the piston or the ball toward the top of the column. Hold your  breath for 3-5 seconds or for as long as possible. Allow the piston or ball to fall to the bottom of the column. Remove the mouthpiece from your mouth and breathe out normally. Rest for a few seconds and repeat Steps 1 through 7 at least 10 times every 1-2 hours when you are awake. Take your time and take a few normal breaths between deep breaths. The spirometer may include an indicator to show your best effort. Use the indicator as a goal to work toward during each repetition. After each set of 10 deep breaths, practice coughing to be sure your lungs are clear. If you have an incision (the cut made at the time of surgery), support your incision when coughing by placing a pillow or rolled up towels firmly against it. Once you are able to get out of bed, walk around indoors and cough well.  You may stop using the incentive spirometer when instructed by your caregiver.  RISKS AND COMPLICATIONS Take your time so you do not get dizzy or light-headed. If you are in pain, you may need to take or ask for pain medication before doing incentive spirometry. It is harder to take a deep breath if you are having pain. AFTER USE Rest and breathe slowly and easily. It can be helpful to keep track of a log of your progress. Your caregiver can provide you with a simple table to help with this. If you are using the spirometer at home, follow these instructions: SEEK MEDICAL CARE IF:  You are having difficultly using the spirometer. You have trouble using the spirometer as often as instructed. Your pain medication is not giving enough relief while using the spirometer. You develop fever of 100.5 F (38.1 C) or higher. SEEK IMMEDIATE MEDICAL CARE IF:  You cough up bloody sputum that had not been present before. You develop fever of 102 F (38.9 C) or greater. You develop worsening pain at or near the incision site. MAKE SURE YOU:  Understand these instructions. Will watch your condition. Will get help right  away if you are not doing well or get worse. Document Released: 02/20/2007 Document Revised: 01/02/2012 Document Reviewed: 04/23/2007 Exit[employer] Patient Information 2014 [employer], [place].   ________________________________________________________________________ WHAT IS A BLOOD TRANSFUSION? Blood Transfusion Information  A transfusion is the replacement of blood or some of its parts. Blood is made up of multiple cells which provide different functions. Red blood cells carry oxygen and are used for blood loss replacement. White blood cells fight against infection. Platelets control bleeding. Plasma helps clot blood. Other blood products are available for specialized needs, such as hemophilia or other clotting disorders. BEFORE THE TRANSFUSION  Who gives blood for transfusions?  Healthy volunteers who are fully evaluated to make sure their blood is safe. This is blood bank blood. Transfusion therapy is the safest it has ever been in the practice of medicine. Before blood is taken from a donor, a complete history is taken to make sure that person has no history of diseases nor engages in risky social behavior (examples are intravenous drug use or sexual activity with multiple partners). The donor's travel history is screened to minimize risk of transmitting infections, such as malaria. The donated blood is tested for signs of infectious diseases, such as HIV and hepatitis. The blood is then tested to be sure it is compatible with you in order to minimize the chance of a transfusion reaction. If you or a relative donates blood, this is often done in anticipation of surgery and is not appropriate for emergency situations. It takes many days to process the donated blood. RISKS AND COMPLICATIONS Although transfusion therapy is very safe and saves many lives, the main dangers of transfusion include:  Getting an infectious disease. Developing a transfusion reaction. This is an allergic reaction to something  in the blood you were given. Every precaution is taken to prevent this. The decision to have a blood transfusion has been considered carefully by your caregiver before blood is given. Blood is not given unless the benefits outweigh the risks. AFTER THE TRANSFUSION Right after receiving a blood transfusion, you will usually feel much better and more energetic. This is especially true if your red blood cells have gotten low (anemic). The transfusion raises the level of the red blood cells which carry oxygen, and this usually causes an energy increase. The nurse administering the transfusion will  monitor you carefully for complications. HOME CARE INSTRUCTIONS  No special instructions are needed after a transfusion. You may find your energy is better. Speak with your caregiver about any limitations on activity for underlying diseases you may have. SEEK MEDICAL CARE IF:  Your condition is not improving after your transfusion. You develop redness or irritation at the intravenous (IV) site. SEEK IMMEDIATE MEDICAL CARE IF:  Any of the following symptoms occur over the next 12 hours: Shaking chills. You have a temperature by mouth above 102 F (38.9 C), not controlled by medicine. Chest, back, or muscle pain. People around you feel you are not acting correctly or are confused. Shortness of breath or difficulty breathing. Dizziness and fainting. You get a rash or develop hives. You have a decrease in urine output. Your urine turns a dark color or changes to pink, red, or brown. Any of the following symptoms occur over the next 10 days: You have a temperature by mouth above 102 F (38.9 C), not controlled by medicine. Shortness of breath. Weakness after normal activity. The white part of the eye turns yellow (jaundice). You have a decrease in the amount of urine or are urinating less often. Your urine turns a dark color or changes to pink, red, or brown. Document Released: 10/07/2000 Document  Revised: 01/02/2012 Document Reviewed: 05/26/2008 [facility] Patient Information 2014 [place], [place].  _______________________________________________________________________

## 2023-06-27 ENCOUNTER — Other Ambulatory Visit: Payer: Self-pay | Admitting: Internal Medicine

## 2023-06-27 DIAGNOSIS — E1142 Type 2 diabetes mellitus with diabetic polyneuropathy: Secondary | ICD-10-CM

## 2023-06-28 ENCOUNTER — Other Ambulatory Visit: Payer: Self-pay | Admitting: Internal Medicine

## 2023-06-28 ENCOUNTER — Ambulatory Visit: Payer: Self-pay | Admitting: Student

## 2023-06-28 DIAGNOSIS — E1142 Type 2 diabetes mellitus with diabetic polyneuropathy: Secondary | ICD-10-CM

## 2023-06-28 NOTE — Telephone Encounter (Signed)
Medication Refill - Medication:  LANTUS 100 UNIT/ML injection  *not out yet but leaving on Friday morning , 06/30/23, to be out of town for 10 days and needs the refill to take with her*  Has the patient contacted their pharmacy? Yes.   Stated they faxed over a request but advised her to contact her PCP  Preferred Pharmacy (with phone number or street name):  [facility] [place], [place] - [address]  Phone: [phone] Fax: [phone]   Has the patient been seen for an appointment in the last year OR does the patient have an upcoming appointment? Yes.

## 2023-06-28 NOTE — H&P (View-Only) (Signed)
TOTAL HIP ADMISSION H&P  Patient is admitted for left total hip arthroplasty.  Subjective:  Chief Complaint: left hip pain  HPI: Danielle [name], 88 y.o. female, has a history of pain and functional disability in the left hip(s) due to arthritis and patient has failed non-surgical conservative treatments for greater than 12 weeks to include NSAID's and/or analgesics, flexibility and strengthening excercises, use of assistive devices, and activity modification.  Onset of symptoms was gradual starting 10 years ago with rapidlly worsening course since that time.The patient noted no past surgery on the left hip(s).  Patient currently rates pain in the left hip at 10 out of 10 with activity. Patient has night pain, worsening of pain with activity and weight bearing, trendelenberg gait, pain that interfers with activities of daily living, and pain with passive range of motion. Patient has evidence of subchondral cysts, subchondral sclerosis, periarticular osteophytes, and joint space narrowing by imaging studies. This condition presents safety issues increasing the risk of falls.  There is no current active infection.  Patient Active Problem List   Diagnosis Date Noted   Anxiety disorder 04/21/2022   Body mass index (BMI) 31.0-31.9, adult 04/21/2022   Hearing loss 04/21/2022   Psychophysiologic insomnia 04/21/2022   Screening for malignant neoplasm of colon 04/21/2022   Type 2 diabetes mellitus with hyperglycemia (HCC) 04/21/2022   History of gout 02/24/2022   Atrial fibrillation (HCC) 11/26/2021   Decreased estrogen level 11/26/2021   Diabetic peripheral neuropathy associated with type 2 diabetes mellitus (HCC) 11/26/2021   Essential hypertension 11/26/2021   Disorder of bone 11/26/2021   Long term (current) use of insulin (HCC) 11/26/2021   Mixed hyperlipidemia 11/26/2021   GERD (gastroesophageal reflux disease) 11/26/2021   Vitamin B12 deficient megaloblastic anemia 09/08/2021   Iron  deficiency anemia due to chronic blood loss 04/27/2021   Asymmetric SNHL (sensorineural hearing loss) 12/05/2018   Tinnitus of both ears 12/05/2018   Past Medical History:  Diagnosis Date   Anemia    Anxiety    Arrhythmia    Arthritis    Atrial fibrillation (HCC)    Cataract    bilateral   Colon polyps    Diabetes (HCC)    Diabetes mellitus without complication (HCC)    type two   Dyspnea    GERD (gastroesophageal reflux disease)    Hypercholesteremia    Hypertension    Iron deficiency anemia    Pancreatitis    Peripheral neuropathy    Refusal of blood transfusions as patient is Jehovah's Witness    Squamous cell skin cancer     Past Surgical History:  Procedure Laterality Date   APPENDECTOMY  1957   BREAST EXCISIONAL BIOPSY Right 1966   CATARACT EXTRACTION, BILATERAL     CHOLECYSTECTOMY  10/2008   lump removed right breast  1958   bENIGN   right ankle plate  [id number]   VAGINAL HYSTERECTOMY  1978    Current Outpatient Medications  Medication Sig Dispense Refill Last Dose   Accu-Chek Softclix Lancets lancets Use as instructed 100 each 12    atorvastatin (LIPITOR) 10 MG tablet Take 1 tablet by mouth once daily 90 tablet 1    Blood Glucose Monitoring Suppl (ACCU-CHEK GUIDE) w/Device KIT DxE11.65 Check BG tid 1 kit 0    calcium carbonate (OSCAL) 1500 (600 Ca) MG TABS tablet Take 600 mg of elemental calcium by mouth 2 (two) times daily with a meal.      Cholecalciferol (VITAMIN D3) 1000 UNITS CAPS Take 1,000  Units by mouth in the morning and at bedtime.      colchicine 0.6 MG tablet Take 1 tablet by mouth once daily (Patient taking differently: Take 0.6 mg by mouth daily as needed (gout flares).) 20 tablet 0    cyanocobalamin (,VITAMIN B-12,) 1000 MCG/ML injection Inject 1,000 mcg into the muscle every 30 (thirty) days.      diltiazem (DILACOR XR) 120 MG 24 hr capsule Take 1 capsule (120 mg total) by mouth daily. 90 capsule 3    gabapentin (NEURONTIN) 300 MG capsule TAKE 3  CAPSULES BY MOUTH AT BEDTIME (Patient taking differently: Take 600-900 mg by mouth at bedtime.) 270 capsule 3    glucose blood (ACCU-CHEK GUIDE) test strip Use as instructed 100 each 12    Insulin Pen Needle (PEN NEEDLES) 31G X 8 MM MISC 1 each by Does not apply route 2 (two) times daily. 100 each 3    Insulin Syringe-Needle U-100 (B-D INS SYR ULTRAFINE .5CC/30G) 30G X 1/2" 0.5 ML MISC 1 each by Does not apply route daily. 100 each 3    LANTUS 100 UNIT/ML injection INJECT 24 TO 26 UNITS SUBCUTANEOUSLY AT BEDTIME 10 mL 0    metFORMIN (GLUCOPHAGE) 1000 MG tablet Take 1 tablet (1,000 mg total) by mouth 2 (two) times daily. 180 tablet 1    metoprolol succinate (TOPROL-XL) 50 MG 24 hr tablet TAKE 1 TABLET BY MOUTH TWICE DAILY (MORNING  AT  AT  BEDTIME)  WITH  OR  IMMEDIATELY  FOLLOWING  A  MEAL 180 tablet 3    omeprazole (PRILOSEC) 40 MG capsule Take 1 capsule (40 mg total) by mouth daily. (Patient taking differently: Take 40 mg by mouth every evening.) 90 capsule 1    Polyethyl Glycol-Propyl Glycol (SYSTANE) 0.4-0.3 % SOLN Place 1-2 drops into both eyes 3 (three) times daily as needed (dry/irritated eyes).      rivaroxaban (XARELTO) 20 MG TABS tablet TAKE 1 TABLET BY MOUTH ONCE DAILY WITH SUPPER 90 tablet 1    No current facility-administered medications for this visit.   Allergies  Allergen Reactions   Amoxicillin Swelling and Rash    Throat swells   Hydrochlorothiazide Other (See Comments)    HA and Leg cramps   Shellfish Allergy    Shrimp (Diagnostic) Other (See Comments)    Gout flares   Cleocin [Clindamycin Hcl] Rash   Codeine Rash    And edema   Penicillins Rash    Social History   Tobacco Use   Smoking status: Never   Smokeless tobacco: Never  Substance Use Topics   Alcohol use: No    Alcohol/week: 0.0 standard drinks of alcohol    Family History  Problem Relation Age of Onset   Hypertension Mother    Stroke Mother    Diabetes Mother    Colon cancer Mother 31   Cerebral  aneurysm Father    Colon cancer Sister        in her 110s   CAD Sister    Heart attack Sister    Uterine cancer Sister        in her late 42s   Colon cancer Sister        in her 25s   Parkinsonism Sister    Dementia Sister    Heart attack Brother    Heart attack Brother    Throat cancer Brother    Atrial fibrillation Brother    Breast cancer Neg Hx      Review of Systems  Musculoskeletal:  Positive for arthralgias and gait problem.  All other systems reviewed and are negative.   Objective:  Physical Exam Constitutional:      Appearance: Normal appearance.  HENT:     Head: Normocephalic and atraumatic.     Nose: Nose normal.     Mouth/Throat:     Mouth: Mucous membranes are moist.     Pharynx: Oropharynx is clear.  Eyes:     Conjunctiva/sclera: Conjunctivae normal.  Cardiovascular:     Rate and Rhythm: Normal rate and regular rhythm.     Pulses: Normal pulses.     Heart sounds: Normal heart sounds.  Pulmonary:     Effort: Pulmonary effort is normal.     Breath sounds: Normal breath sounds.  Abdominal:     General: Abdomen is flat.     Palpations: Abdomen is soft.  Genitourinary:    Comments: deferred Musculoskeletal:     Cervical back: Normal range of motion and neck supple.     Comments: Examination left hip reveals no skin wounds or lesions. Mild trochanteric tenderness to palpation. She has severely restricted range of motion of the left hip. Pain with terminal flexion rotation. Pain in the position of impingement. Positive Stinchfield.  Distally, there is no focal motor or sensory deficit. Has palpable pedal pulses.  Ambulates with an antalgic  Skin:    General: Skin is warm and dry.     Capillary Refill: Capillary refill takes less than 2 seconds.  Neurological:     General: No focal deficit present.     Mental Status: She is alert and oriented to person, place, and time.  Psychiatric:        Mood and Affect: Mood normal.        Behavior: Behavior  normal.        Thought Content: Thought content normal.        Judgment: Judgment normal.     Vital signs in last 24 hours: @VSRANGES @  Labs:   Estimated body mass index is 28.49 kg/m as calculated from the following:   Height as of 06/30/23: 5\' 4"  (1.626 m).   Weight as of 06/30/23: 75.3 kg.   Imaging Review Plain radiographs demonstrate severe degenerative joint disease of the left hip(s). The bone quality appears to be adequate for age and reported activity level.      Assessment/Plan:  End stage arthritis, left hip(s)  The patient history, physical examination, clinical judgement of the provider and imaging studies are consistent with end stage degenerative joint disease of the left hip(s) and total hip arthroplasty is deemed medically necessary. The treatment options including medical management, injection therapy, arthroscopy and arthroplasty were discussed at length. The risks and benefits of total hip arthroplasty were presented and reviewed. The risks due to aseptic loosening, infection, stiffness, dislocation/subluxation,  thromboembolic complications and other imponderables were discussed.  The patient acknowledged the explanation, agreed to proceed with the plan and consent was signed. Patient is being admitted for inpatient treatment for surgery, pain control, PT, OT, prophylactic antibiotics, VTE prophylaxis, progressive ambulation and ADL's and discharge planning.The patient is planning to be discharged home with HEP after an overnight stay.   Therapy Plans: HEP.  Disposition: Home with daughters Planned DVT Prophylaxis: Xarelto 20mg  daily DME needed: Has rolling walker.  PCP: Cleared.  Cardiology: Cleared. Hold xarelto 3 days prior to surgery.  TXA: IV Allergies:  - Clindamycin - rash - Codeine - rash - HCTZ - rash - Penicillin - rash -  Amoxicillin - rash, had recent dosing with no reaction.  Anesthesia Concerns: None.  BMI: 28.9 Last HgbA1c: 7.6 Other: -  T2DM, insulin.  - A-fib, xarelto. Stop xarelto 20g daily 3 days prior to surgery.  - Hydrocodone, zofran.  - NO NSAIDs.  - 06/30/23: Hgb 12.2, K+ 3.8, Cr. 0.82.    Patient's anticipated LOS is less than 2 midnights, meeting these requirements: - Younger than 63 - Lives within 1 hour of care - Has a competent adult at home to recover with post-op recover - NO history of  - Chronic pain requiring opiods  - Diabetes  - Coronary Artery Disease  - Heart failure  - Heart attack  - Stroke  - DVT/VTE  - Cardiac arrhythmia  - Respiratory Failure/COPD  - Renal failure  - Anemia  - Advanced Liver disease

## 2023-06-28 NOTE — H&P (Signed)
TOTAL HIP ADMISSION H&P  Patient is admitted for left total hip arthroplasty.  Subjective:  Chief Complaint: left hip pain  HPI: Danielle [name], 88 y.o. female, has a history of pain and functional disability in the left hip(s) due to arthritis and patient has failed non-surgical conservative treatments for greater than 12 weeks to include NSAID's and/or analgesics, flexibility and strengthening excercises, use of assistive devices, and activity modification.  Onset of symptoms was gradual starting 10 years ago with rapidlly worsening course since that time.The patient noted no past surgery on the left hip(s).  Patient currently rates pain in the left hip at 10 out of 10 with activity. Patient has night pain, worsening of pain with activity and weight bearing, trendelenberg gait, pain that interfers with activities of daily living, and pain with passive range of motion. Patient has evidence of subchondral cysts, subchondral sclerosis, periarticular osteophytes, and joint space narrowing by imaging studies. This condition presents safety issues increasing the risk of falls.  There is no current active infection.  Patient Active Problem List   Diagnosis Date Noted   Anxiety disorder 04/21/2022   Body mass index (BMI) 31.0-31.9, adult 04/21/2022   Hearing loss 04/21/2022   Psychophysiologic insomnia 04/21/2022   Screening for malignant neoplasm of colon 04/21/2022   Type 2 diabetes mellitus with hyperglycemia (HCC) 04/21/2022   History of gout 02/24/2022   Atrial fibrillation (HCC) 11/26/2021   Decreased estrogen level 11/26/2021   Diabetic peripheral neuropathy associated with type 2 diabetes mellitus (HCC) 11/26/2021   Essential hypertension 11/26/2021   Disorder of bone 11/26/2021   Long term (current) use of insulin (HCC) 11/26/2021   Mixed hyperlipidemia 11/26/2021   GERD (gastroesophageal reflux disease) 11/26/2021   Vitamin B12 deficient megaloblastic anemia 09/08/2021   Iron  deficiency anemia due to chronic blood loss 04/27/2021   Asymmetric SNHL (sensorineural hearing loss) 12/05/2018   Tinnitus of both ears 12/05/2018   Past Medical History:  Diagnosis Date   Anemia    Anxiety    Arrhythmia    Arthritis    Atrial fibrillation (HCC)    Cataract    bilateral   Colon polyps    Diabetes (HCC)    Diabetes mellitus without complication (HCC)    type two   Dyspnea    GERD (gastroesophageal reflux disease)    Hypercholesteremia    Hypertension    Iron deficiency anemia    Pancreatitis    Peripheral neuropathy    Refusal of blood transfusions as patient is Jehovah's Witness    Squamous cell skin cancer     Past Surgical History:  Procedure Laterality Date   APPENDECTOMY  1957   BREAST EXCISIONAL BIOPSY Right 1966   CATARACT EXTRACTION, BILATERAL     CHOLECYSTECTOMY  10/2008   lump removed right breast  1958   bENIGN   right ankle plate  [id number]   VAGINAL HYSTERECTOMY  1978    Current Outpatient Medications  Medication Sig Dispense Refill Last Dose   Accu-Chek Softclix Lancets lancets Use as instructed 100 each 12    atorvastatin (LIPITOR) 10 MG tablet Take 1 tablet by mouth once daily 90 tablet 1    Blood Glucose Monitoring Suppl (ACCU-CHEK GUIDE) w/Device KIT DxE11.65 Check BG tid 1 kit 0    calcium carbonate (OSCAL) 1500 (600 Ca) MG TABS tablet Take 600 mg of elemental calcium by mouth 2 (two) times daily with a meal.      Cholecalciferol (VITAMIN D3) 1000 UNITS CAPS Take 1,000  Units by mouth in the morning and at bedtime.      colchicine 0.6 MG tablet Take 1 tablet by mouth once daily (Patient taking differently: Take 0.6 mg by mouth daily as needed (gout flares).) 20 tablet 0    cyanocobalamin (,VITAMIN B-12,) 1000 MCG/ML injection Inject 1,000 mcg into the muscle every 30 (thirty) days.      diltiazem (DILACOR XR) 120 MG 24 hr capsule Take 1 capsule (120 mg total) by mouth daily. 90 capsule 3    gabapentin (NEURONTIN) 300 MG capsule TAKE 3  CAPSULES BY MOUTH AT BEDTIME (Patient taking differently: Take 600-900 mg by mouth at bedtime.) 270 capsule 3    glucose blood (ACCU-CHEK GUIDE) test strip Use as instructed 100 each 12    Insulin Pen Needle (PEN NEEDLES) 31G X 8 MM MISC 1 each by Does not apply route 2 (two) times daily. 100 each 3    Insulin Syringe-Needle U-100 (B-D INS SYR ULTRAFINE .5CC/30G) 30G X 1/2" 0.5 ML MISC 1 each by Does not apply route daily. 100 each 3    LANTUS 100 UNIT/ML injection INJECT 24 TO 26 UNITS SUBCUTANEOUSLY AT BEDTIME 10 mL 0    metFORMIN (GLUCOPHAGE) 1000 MG tablet Take 1 tablet (1,000 mg total) by mouth 2 (two) times daily. 180 tablet 1    metoprolol succinate (TOPROL-XL) 50 MG 24 hr tablet TAKE 1 TABLET BY MOUTH TWICE DAILY (MORNING  AT  AT  BEDTIME)  WITH  OR  IMMEDIATELY  FOLLOWING  A  MEAL 180 tablet 3    omeprazole (PRILOSEC) 40 MG capsule Take 1 capsule (40 mg total) by mouth daily. (Patient taking differently: Take 40 mg by mouth every evening.) 90 capsule 1    Polyethyl Glycol-Propyl Glycol (SYSTANE) 0.4-0.3 % SOLN Place 1-2 drops into both eyes 3 (three) times daily as needed (dry/irritated eyes).      rivaroxaban (XARELTO) 20 MG TABS tablet TAKE 1 TABLET BY MOUTH ONCE DAILY WITH SUPPER 90 tablet 1    No current facility-administered medications for this visit.   Allergies  Allergen Reactions   Amoxicillin Swelling and Rash    Throat swells   Hydrochlorothiazide Other (See Comments)    HA and Leg cramps   Shellfish Allergy    Shrimp (Diagnostic) Other (See Comments)    Gout flares   Cleocin [Clindamycin Hcl] Rash   Codeine Rash    And edema   Penicillins Rash    Social History   Tobacco Use   Smoking status: Never   Smokeless tobacco: Never  Substance Use Topics   Alcohol use: No    Alcohol/week: 0.0 standard drinks of alcohol    Family History  Problem Relation Age of Onset   Hypertension Mother    Stroke Mother    Diabetes Mother    Colon cancer Mother 59   Cerebral  aneurysm Father    Colon cancer Sister        in her 42s   CAD Sister    Heart attack Sister    Uterine cancer Sister        in her late 9s   Colon cancer Sister        in her 48s   Parkinsonism Sister    Dementia Sister    Heart attack Brother    Heart attack Brother    Throat cancer Brother    Atrial fibrillation Brother    Breast cancer Neg Hx      Review of Systems  Musculoskeletal:  Positive for arthralgias and gait problem.  All other systems reviewed and are negative.   Objective:  Physical Exam Constitutional:      Appearance: Normal appearance.  HENT:     Head: Normocephalic and atraumatic.     Nose: Nose normal.     Mouth/Throat:     Mouth: Mucous membranes are moist.     Pharynx: Oropharynx is clear.  Eyes:     Conjunctiva/sclera: Conjunctivae normal.  Cardiovascular:     Rate and Rhythm: Normal rate and regular rhythm.     Pulses: Normal pulses.     Heart sounds: Normal heart sounds.  Pulmonary:     Effort: Pulmonary effort is normal.     Breath sounds: Normal breath sounds.  Abdominal:     General: Abdomen is flat.     Palpations: Abdomen is soft.  Genitourinary:    Comments: deferred Musculoskeletal:     Cervical back: Normal range of motion and neck supple.     Comments: Examination left hip reveals no skin wounds or lesions. Mild trochanteric tenderness to palpation. She has severely restricted range of motion of the left hip. Pain with terminal flexion rotation. Pain in the position of impingement. Positive Stinchfield.  Distally, there is no focal motor or sensory deficit. Has palpable pedal pulses.  Ambulates with an antalgic  Skin:    General: Skin is warm and dry.     Capillary Refill: Capillary refill takes less than 2 seconds.  Neurological:     General: No focal deficit present.     Mental Status: She is alert and oriented to person, place, and time.  Psychiatric:        Mood and Affect: Mood normal.        Behavior: Behavior  normal.        Thought Content: Thought content normal.        Judgment: Judgment normal.     Vital signs in last 24 hours: @VSRANGES @  Labs:   Estimated body mass index is 28.49 kg/m as calculated from the following:   Height as of 06/30/23: 5\' 4"  (1.626 m).   Weight as of 06/30/23: 75.3 kg.   Imaging Review Plain radiographs demonstrate severe degenerative joint disease of the left hip(s). The bone quality appears to be adequate for age and reported activity level.      Assessment/Plan:  End stage arthritis, left hip(s)  The patient history, physical examination, clinical judgement of the provider and imaging studies are consistent with end stage degenerative joint disease of the left hip(s) and total hip arthroplasty is deemed medically necessary. The treatment options including medical management, injection therapy, arthroscopy and arthroplasty were discussed at length. The risks and benefits of total hip arthroplasty were presented and reviewed. The risks due to aseptic loosening, infection, stiffness, dislocation/subluxation,  thromboembolic complications and other imponderables were discussed.  The patient acknowledged the explanation, agreed to proceed with the plan and consent was signed. Patient is being admitted for inpatient treatment for surgery, pain control, PT, OT, prophylactic antibiotics, VTE prophylaxis, progressive ambulation and ADL's and discharge planning.The patient is planning to be discharged home with HEP after an overnight stay.   Therapy Plans: HEP.  Disposition: Home with daughters Planned DVT Prophylaxis: Xarelto 20mg  daily DME needed: Has rolling walker.  PCP: Cleared.  Cardiology: Cleared. Hold xarelto 3 days prior to surgery.  TXA: IV Allergies:  - Clindamycin - rash - Codeine - rash - HCTZ - rash - Penicillin - rash -  Amoxicillin - rash, had recent dosing with no reaction.  Anesthesia Concerns: None.  BMI: 28.9 Last HgbA1c: 7.6 Other: -  T2DM, insulin.  - A-fib, xarelto. Stop xarelto 20g daily 3 days prior to surgery.  - Hydrocodone, zofran.  - NO NSAIDs.  - 06/30/23: Hgb 12.2, K+ 3.8, Cr. 0.82.    Patient's anticipated LOS is less than 2 midnights, meeting these requirements: - Younger than 21 - Lives within 1 hour of care - Has a competent adult at home to recover with post-op recover - NO history of  - Chronic pain requiring opiods  - Diabetes  - Coronary Artery Disease  - Heart failure  - Heart attack  - Stroke  - DVT/VTE  - Cardiac arrhythmia  - Respiratory Failure/COPD  - Renal failure  - Anemia  - Advanced Liver disease

## 2023-06-29 NOTE — Progress Notes (Addendum)
COVID Vaccine Completed:  Yes  Date of COVID positive in last 90 days:  No  PCP - Margarita Mail, DO Cardiologist - Lennie Odor, MD  Cardiac clearance in Epic dated 04-17-23 by Edd Fabian, NP  Chest x-ray -  EKG - 05-02-23 Epic Stress Test - Yes ECHO - 03-23-21 Epic Cardiac Cath - N/A Pacemaker/ICD device last checked: Spinal Cord Stimulator:N/A  Bowel Prep - N/A  Sleep Study - N/A CPAP -   Fasting Blood Sugar - 130 or lower  Checks Blood Sugar  - 1 time a day  Last dose of GLP1 agonist-  N/A GLP1 instructions:  N/A   Last dose of SGLT-2 inhibitors-  N/A SGLT-2 instructions: N/A   Blood Thinner Instructions:  Xarelto.  Hold x3 days (do not take after 07-09-23) Aspirin Instructions: Last Dose:  Activity level:  Can go up a flight of stairs and perform activities of daily living without stopping and without symptoms of chest pain.  Patient states that she does have shortness of breath.with exertion at times.  This has not worsened.   Anesthesia review: Afib, HTN, DM, shortness of breath  Patient denies shortness of breath, fever, cough and chest pain at PAT appointment  Patient verbalized understanding of instructions that were given to them at the PAT appointment. Patient was also instructed that they will need to review over the PAT instructions again at home before surgery.

## 2023-06-29 NOTE — Telephone Encounter (Signed)
Requested Prescriptions  Pending Prescriptions Disp Refills   LANTUS 100 UNIT/ML injection [Pharmacy Med Name: Lantus 100 UNIT/ML Subcutaneous Solution] 10 mL 0    Sig: INJECT 24 TO 26 UNITS SUBCUTANEOUSLY AT BEDTIME     Endocrinology:  Diabetes - Insulins Passed - 06/27/2023  9:28 PM      Passed - HBA1C is between 0 and 7.9 and within 180 days    Hemoglobin A1C  Date Value Ref Range Status  05/02/2023 7.6 (A) 4.0 - 5.6 % Final   Hgb A1c MFr Bld  Date Value Ref Range Status  11/30/2022 8.0 (H) <5.7 % of total Hgb Final    Comment:    For someone without known diabetes, a hemoglobin A1c value of 6.5% or greater indicates that they may have  diabetes and this should be confirmed with a follow-up  test. . For someone with known diabetes, a value <7% indicates  that their diabetes is well controlled and a value  greater than or equal to 7% indicates suboptimal  control. A1c targets should be individualized based on  duration of diabetes, age, comorbid conditions, and  other considerations. . Currently, no consensus exists regarding use of hemoglobin A1c for diagnosis of diabetes for children. Verna Czech - Valid encounter within last 6 months    Recent Outpatient Visits           3 weeks ago Right flank pain   [place] [facility] Margarita Mail, DO   1 month ago Pre-op evaluation   [facility] Margarita Mail, DO   3 months ago Type 2 diabetes mellitus with diabetic polyneuropathy, with long-term current use of insulin [facility])   [place] [facility] Margarita Mail, DO   7 months ago Diabetic peripheral neuropathy associated with type 2 diabetes mellitus [facility])   [facility] Margarita Mail, DO   10 months ago Type 2 diabetes mellitus with hyperglycemia, with long-term current use of insulin [facility])   [place] [facility] Margarita Mail, DO        Future Appointments             In 1 month Margarita Mail, DO [facility]   In 2 months  [facility]

## 2023-06-30 ENCOUNTER — Encounter (HOSPITAL_COMMUNITY)
Admission: RE | Admit: 2023-06-30 | Discharge: 2023-06-30 | Disposition: A | Discharge status: Home / Self Care | Payer: Medicare Other | Source: Ambulatory Visit | Attending: Orthopedic Surgery | Admitting: Orthopedic Surgery

## 2023-06-30 ENCOUNTER — Other Ambulatory Visit: Payer: Self-pay

## 2023-06-30 ENCOUNTER — Encounter (HOSPITAL_COMMUNITY): Payer: Self-pay

## 2023-06-30 VITALS — BP 151/89 | HR 78 | Temp 98.2°F | Resp 16 | Ht 64.0 in | Wt 166.0 lb

## 2023-06-30 DIAGNOSIS — Z794 Long term (current) use of insulin: Secondary | ICD-10-CM | POA: Insufficient documentation

## 2023-06-30 DIAGNOSIS — E1165 Type 2 diabetes mellitus with hyperglycemia: Secondary | ICD-10-CM

## 2023-06-30 DIAGNOSIS — Z01818 Encounter for other preprocedural examination: Secondary | ICD-10-CM | POA: Diagnosis present

## 2023-06-30 DIAGNOSIS — I10 Essential (primary) hypertension: Secondary | ICD-10-CM | POA: Diagnosis not present

## 2023-06-30 DIAGNOSIS — Z01812 Encounter for preprocedural laboratory examination: Secondary | ICD-10-CM | POA: Diagnosis not present

## 2023-06-30 DIAGNOSIS — D649 Anemia, unspecified: Secondary | ICD-10-CM | POA: Diagnosis not present

## 2023-06-30 DIAGNOSIS — E119 Type 2 diabetes mellitus without complications: Secondary | ICD-10-CM

## 2023-06-30 HISTORY — DX: Procedure and treatment not carried out because of patient's decision for reasons of belief and group pressure: Z53.1

## 2023-06-30 HISTORY — DX: Reserved for inherently not codable concepts without codable children: IMO0001

## 2023-06-30 HISTORY — DX: Dyspnea, unspecified: R06.00

## 2023-06-30 LAB — CBC
HCT: 38.5 % (ref 36.0–46.0)
Hemoglobin: 12.2 g/dL (ref 12.0–15.0)
MCH: 30.3 pg (ref 26.0–34.0)
MCHC: 31.7 g/dL (ref 30.0–36.0)
MCV: 95.5 fL (ref 80.0–100.0)
Platelets: 261 10*3/uL (ref 150–400)
RBC: 4.03 MIL/uL (ref 3.87–5.11)
RDW: 13 % (ref 11.5–15.5)
WBC: 6.3 10*3/uL (ref 4.0–10.5)
nRBC: 0 % (ref 0.0–0.2)

## 2023-06-30 LAB — SURGICAL PCR SCREEN
MRSA, PCR: NEGATIVE
Staphylococcus aureus: NEGATIVE

## 2023-06-30 LAB — GLUCOSE, CAPILLARY: Glucose-Capillary: 104 mg/dL — ABNORMAL HIGH (ref 70–99)

## 2023-06-30 LAB — BASIC METABOLIC PANEL
Anion gap: 10 (ref 5–15)
BUN: 12 mg/dL (ref 8–23)
CO2: 26 mmol/L (ref 22–32)
Calcium: 9 mg/dL (ref 8.9–10.3)
Chloride: 102 mmol/L (ref 98–111)
Creatinine, Ser: 0.82 mg/dL (ref 0.44–1.00)
GFR, Estimated: >60 mL/min (ref >60)
Glucose, Bld: 100 mg/dL — ABNORMAL HIGH (ref 70–99)
Potassium: 3.8 mmol/L (ref 3.5–5.1)
Sodium: 138 mmol/L (ref 135–145)

## 2023-06-30 LAB — HEMOGLOBIN A1C
Hgb A1c MFr Bld: 7.3 % — ABNORMAL HIGH (ref 4.8–5.6)
Mean Plasma Glucose: 162.81 mg/dL

## 2023-07-04 ENCOUNTER — Encounter (HOSPITAL_COMMUNITY): Payer: Self-pay

## 2023-07-04 NOTE — Anesthesia Preprocedure Evaluation (Addendum)
Anesthesia Evaluation  Patient identified by MRN, date of birth, ID band Patient awake    Reviewed: Allergy & Precautions, NPO status , Patient's Chart, lab work & pertinent test results, reviewed documented beta blocker date and time   History of Anesthesia Complications Negative for: history of anesthetic complications  Airway Mallampati: II  TM Distance: >3 FB Neck ROM: Full    Dental  (+) Dental Advisory Given, Teeth Intact   Pulmonary neg pulmonary ROS   Pulmonary exam normal        Cardiovascular hypertension, Pt. on medications and Pt. on home beta blockers Normal cardiovascular exam+ dysrhythmias Atrial Fibrillation      Neuro/Psych  PSYCHIATRIC DISORDERS Anxiety     negative neurological ROS     GI/Hepatic Neg liver ROS,GERD  Controlled,,  Endo/Other  diabetes, Type 2, Insulin Dependent, Oral Hypoglycemic Agents    Renal/GU negative Renal ROS     Musculoskeletal  (+) Arthritis ,    Abdominal   Peds  Hematology  On xarelto Ok to receive blood if life-threatening issue arises    Anesthesia Other Findings Hearing deficit Tinnitus   Reproductive/Obstetrics                             Anesthesia Physical Anesthesia Plan  ASA: 3  Anesthesia Plan: Spinal   Post-op Pain Management: Tylenol PO (pre-op)*   Induction:   PONV Risk Score and Plan: 2 and Treatment may vary due to age or medical condition and Propofol infusion  Airway Management Planned: Natural Airway and Simple Face Mask  Additional Equipment: None  Intra-op Plan:   Post-operative Plan:   Informed Consent: I have reviewed the patients History and Physical, chart, labs and discussed the procedure including the risks, benefits and alternatives for the proposed anesthesia with the patient or authorized representative who has indicated his/her understanding and acceptance.       Plan Discussed with: CRNA and  Anesthesiologist  Anesthesia Plan Comments: (See PAT note )        Anesthesia Quick Evaluation

## 2023-07-04 NOTE — Progress Notes (Signed)
Choose an anesthesia record to view details        DISCUSSION: Danielle [name] is an 88 yo female who presents to PAT prior to L THA with Dr. Linna Caprice on 07/13/23. PMH significant for HTN, A.fib on Xarelto, GERD, insulin dependent DM, anxiety, arthritis  Patient follows with Cardiology due to hx of persistent A.fib on Xarelto. Last seen in clinic on 12/28/22 with Dr. Val EagleJennette Kettle. Noted to be doing well from cardiac standpoint. Mostly rate controlled. Cleared in telephone visit on 04/18/23:  "Chart reviewed as part of pre-operative protocol coverage. Given past medical history and time since last visit, based on ACC/AHA guidelines, ANNDEE TRIPI would be at acceptable risk for the planned procedure without further cardiovascular testing.  Her RCRI is a class II risk, 0.9% risk of major cardiac event.  She is able to complete greater than 4 METS of physical activity. Per office protocol, patient can hold Xarelto for 3 days prior to procedure. Please resume Xarelto as soon as possible postprocedure, at the discretion of the surgeon."  Patient follows with her PCP for other chronic medical issues. Last seen on 06/06/23. All issues stable.  VS: BP (!) 151/89   Pulse 78   Temp 36.8 C (Oral)   Resp 16   Ht 5\' 4"  (1.626 m)   Wt 75.3 kg   SpO2 99%   BMI 28.49 kg/m   PROVIDERS: Margarita Mail, DO Cardiology: [facility]  LABS: Labs reviewed: Acceptable for surgery. (all labs ordered are listed, but only abnormal results are displayed)  Labs Reviewed  GLUCOSE, CAPILLARY - Abnormal; Notable for the following components:      Result Value   Glucose-Capillary 104 (*)    All other components within normal limits  BASIC METABOLIC PANEL - Abnormal; Notable for the following components:   Glucose, Bld 100 (*)    All other components within normal limits  HEMOGLOBIN A1C - Abnormal; Notable for the following components:   Hgb A1c MFr Bld 7.3 (*)    All other components within normal limits  SURGICAL PCR  SCREEN  CBC  TYPE AND SCREEN     IMAGES:   EKG 05/02/23  A.fib with RBBB, rate 76 No significant change from prior  CV:  Echo 03/23/21: IMPRESSIONS     1. Left ventricular ejection fraction, by estimation, is 55 to 60%. The  left ventricle has normal function. The left ventricle has no regional  wall motion abnormalities. There is mild left ventricular hypertrophy.  Left ventricular diastolic function  could not be evaluated.   2. Right ventricular systolic function is normal. The right ventricular  size is normal. There is moderately elevated pulmonary artery systolic  pressure. The estimated right ventricular systolic pressure is 45.2 mmHg.   3. Left atrial size was severely dilated.   4. The mitral valve is abnormal. Trivial mitral valve regurgitation.   5. Tricuspid valve regurgitation is mild to moderate.   6. The aortic valve is tricuspid. Aortic valve regurgitation is not  visualized. Mild to moderate aortic valve sclerosis/calcification is  present, without any evidence of aortic stenosis.   7. The inferior vena cava is normal in size with <50% respiratory  variability, suggesting right atrial pressure of 8 mmHg.   Past Medical History:  Diagnosis Date   Anemia    Anxiety    Arrhythmia    Arthritis    Atrial fibrillation [facility])    Cataract    bilateral   Colon polyps    Diabetes (HCC)  Diabetes mellitus without complication (HCC)    type two   Dyspnea    GERD (gastroesophageal reflux disease)    Hypercholesteremia    Hypertension    Iron deficiency anemia    Pancreatitis    Peripheral neuropathy    Refusal of blood transfusions as patient is Jehovah's Witness    Squamous cell skin cancer     Past Surgical History:  Procedure Laterality Date   APPENDECTOMY  1957   BREAST EXCISIONAL BIOPSY Right 1966   CATARACT EXTRACTION, BILATERAL     CHOLECYSTECTOMY  10/2008   lump removed right breast  1958   bENIGN   right ankle plate  8295   VAGINAL  HYSTERECTOMY  1978    MEDICATIONS:  Accu-Chek Softclix Lancets lancets   atorvastatin (LIPITOR) 10 MG tablet   Blood Glucose Monitoring Suppl (ACCU-CHEK GUIDE) w/Device KIT   calcium carbonate (OSCAL) 1500 (600 Ca) MG TABS tablet   Cholecalciferol (VITAMIN D3) 1000 UNITS CAPS   colchicine 0.6 MG tablet   cyanocobalamin (,VITAMIN B-12,) 1000 MCG/ML injection   diltiazem (DILACOR XR) 120 MG 24 hr capsule   gabapentin (NEURONTIN) 300 MG capsule   glucose blood (ACCU-CHEK GUIDE) test strip   Insulin Pen Needle (PEN NEEDLES) 31G X 8 MM MISC   Insulin Syringe-Needle U-100 (B-D INS SYR ULTRAFINE .5CC/30G) 30G X 1/2" 0.5 ML MISC   LANTUS 100 UNIT/ML injection   metFORMIN (GLUCOPHAGE) 1000 MG tablet   metoprolol succinate (TOPROL-XL) 50 MG 24 hr tablet   omeprazole (PRILOSEC) 40 MG capsule   Polyethyl Glycol-Propyl Glycol (SYSTANE) 0.4-0.3 % SOLN   rivaroxaban (XARELTO) 20 MG TABS tablet   No current facility-administered medications for this encounter.   Marcille Blanco [facility] Surgical Short Stay/Anesthesiology [facility] Phone [phone] 07/04/2023 3:23 PM

## 2023-07-10 ENCOUNTER — Ambulatory Visit: Payer: Medicare Other

## 2023-07-12 ENCOUNTER — Ambulatory Visit (INDEPENDENT_AMBULATORY_CARE_PROVIDER_SITE_OTHER): Payer: Medicare Other

## 2023-07-12 DIAGNOSIS — E538 Deficiency of other specified B group vitamins: Secondary | ICD-10-CM

## 2023-07-12 MED ORDER — CYANOCOBALAMIN 1000 MCG/ML IJ SOLN
1000.0000 ug | Freq: Once | INTRAMUSCULAR | Status: AC
Start: 2023-07-12 — End: 2023-07-12
  Administered 2023-07-12: 1000 ug via INTRAMUSCULAR

## 2023-07-13 ENCOUNTER — Ambulatory Visit (HOSPITAL_COMMUNITY): Payer: Medicare Other

## 2023-07-13 ENCOUNTER — Observation Stay (HOSPITAL_COMMUNITY): Payer: Medicare Other

## 2023-07-13 ENCOUNTER — Ambulatory Visit (HOSPITAL_BASED_OUTPATIENT_CLINIC_OR_DEPARTMENT_OTHER): Payer: Medicare Other | Admitting: Anesthesiology

## 2023-07-13 ENCOUNTER — Ambulatory Visit (HOSPITAL_COMMUNITY): Payer: Medicare Other | Admitting: Medical

## 2023-07-13 ENCOUNTER — Encounter (HOSPITAL_COMMUNITY): Payer: Self-pay | Admitting: Orthopedic Surgery

## 2023-07-13 ENCOUNTER — Encounter (HOSPITAL_COMMUNITY)
Admission: RE | Disposition: A | Discharge status: Home / Self Care | Payer: Self-pay | Source: Ambulatory Visit | Attending: Orthopedic Surgery

## 2023-07-13 ENCOUNTER — Observation Stay (HOSPITAL_COMMUNITY)
Admission: RE | Admit: 2023-07-13 | Discharge: 2023-07-14 | Disposition: A | Discharge status: Home / Self Care | Payer: Medicare Other | Source: Ambulatory Visit | Attending: Orthopedic Surgery | Admitting: Orthopedic Surgery

## 2023-07-13 ENCOUNTER — Other Ambulatory Visit: Payer: Self-pay

## 2023-07-13 DIAGNOSIS — M1612 Unilateral primary osteoarthritis, left hip: Principal | ICD-10-CM | POA: Diagnosis present

## 2023-07-13 DIAGNOSIS — Z7984 Long term (current) use of oral hypoglycemic drugs: Secondary | ICD-10-CM | POA: Diagnosis not present

## 2023-07-13 DIAGNOSIS — E119 Type 2 diabetes mellitus without complications: Secondary | ICD-10-CM

## 2023-07-13 DIAGNOSIS — Z794 Long term (current) use of insulin: Secondary | ICD-10-CM | POA: Insufficient documentation

## 2023-07-13 DIAGNOSIS — Z7901 Long term (current) use of anticoagulants: Secondary | ICD-10-CM | POA: Diagnosis not present

## 2023-07-13 DIAGNOSIS — M5136 Other intervertebral disc degeneration, lumbar region: Secondary | ICD-10-CM | POA: Diagnosis not present

## 2023-07-13 DIAGNOSIS — E1151 Type 2 diabetes mellitus with diabetic peripheral angiopathy without gangrene: Secondary | ICD-10-CM | POA: Diagnosis not present

## 2023-07-13 DIAGNOSIS — I4891 Unspecified atrial fibrillation: Secondary | ICD-10-CM | POA: Insufficient documentation

## 2023-07-13 DIAGNOSIS — Z471 Aftercare following joint replacement surgery: Secondary | ICD-10-CM | POA: Diagnosis not present

## 2023-07-13 DIAGNOSIS — I10 Essential (primary) hypertension: Secondary | ICD-10-CM | POA: Diagnosis not present

## 2023-07-13 DIAGNOSIS — Z79899 Other long term (current) drug therapy: Secondary | ICD-10-CM | POA: Insufficient documentation

## 2023-07-13 DIAGNOSIS — Z85828 Personal history of other malignant neoplasm of skin: Secondary | ICD-10-CM | POA: Diagnosis not present

## 2023-07-13 DIAGNOSIS — Z96642 Presence of left artificial hip joint: Principal | ICD-10-CM | POA: Diagnosis present

## 2023-07-13 HISTORY — PX: TOTAL HIP ARTHROPLASTY: SHX124

## 2023-07-13 LAB — TYPE AND SCREEN
ABO/RH(D): A NEG
Antibody Screen: NEGATIVE

## 2023-07-13 LAB — GLUCOSE, CAPILLARY
Glucose-Capillary: 100 mg/dL — ABNORMAL HIGH (ref 70–99)
Glucose-Capillary: 89 mg/dL (ref 70–99)
Glucose-Capillary: 218 mg/dL — ABNORMAL HIGH (ref 70–99)
Glucose-Capillary: 247 mg/dL — ABNORMAL HIGH (ref 70–99)

## 2023-07-13 LAB — ABO/RH: ABO/RH(D): A NEG

## 2023-07-13 SURGERY — ARTHROPLASTY, HIP, TOTAL, ANTERIOR APPROACH
Anesthesia: Spinal | Site: Hip | Laterality: Left

## 2023-07-13 MED ORDER — METHOCARBAMOL 500 MG IVPB - SIMPLE MED
INTRAVENOUS | Status: AC
  Filled 2023-07-13: qty 55

## 2023-07-13 MED ORDER — SODIUM CHLORIDE (PF) 0.9 % IJ SOLN
INTRAMUSCULAR | Status: DC | PRN
  Administered 2023-07-13: 61 mL

## 2023-07-13 MED ORDER — ACETAMINOPHEN 500 MG PO TABS
500.0000 mg | ORAL_TABLET | Freq: Four times a day (QID) | ORAL | Status: DC
  Administered 2023-07-13: 500 mg via ORAL
  Filled 2023-07-13: qty 1

## 2023-07-13 MED ORDER — FENTANYL CITRATE PF 50 MCG/ML IJ SOSY: 25.0000 ug | PREFILLED_SYRINGE | INTRAMUSCULAR | Status: DC | PRN

## 2023-07-13 MED ORDER — HYDROCODONE-ACETAMINOPHEN 5-325 MG PO TABS
1.0000 | ORAL_TABLET | ORAL | Status: DC | PRN
  Administered 2023-07-14 (×2): 2 via ORAL
  Filled 2023-07-13 (×2): qty 2

## 2023-07-13 MED ORDER — INSULIN ASPART 100 UNIT/ML IJ SOLN
0.0000 [IU] | Freq: Every day | INTRAMUSCULAR | Status: DC
  Administered 2023-07-13: 2 [IU] via SUBCUTANEOUS

## 2023-07-13 MED ORDER — HYDROCODONE-ACETAMINOPHEN 7.5-325 MG PO TABS
1.0000 | ORAL_TABLET | ORAL | Status: DC | PRN
  Administered 2023-07-13 (×2): 1 via ORAL
  Filled 2023-07-13 (×2): qty 1

## 2023-07-13 MED ORDER — METOCLOPRAMIDE HCL 5 MG PO TABS: 5.0000 mg | ORAL_TABLET | Freq: Three times a day (TID) | ORAL | Status: DC | PRN

## 2023-07-13 MED ORDER — BISACODYL 10 MG RE SUPP: 10.0000 mg | Freq: Every day | RECTAL | Status: DC | PRN

## 2023-07-13 MED ORDER — POLYETHYLENE GLYCOL 3350 17 G PO PACK
17.0000 g | PACK | Freq: Every day | ORAL | Status: DC | PRN
  Filled 2023-07-13: qty 1

## 2023-07-13 MED ORDER — OXYCODONE HCL 5 MG/5ML PO SOLN: 5.0000 mg | Freq: Once | ORAL | Status: DC | PRN

## 2023-07-13 MED ORDER — DILTIAZEM HCL ER COATED BEADS 120 MG PO CP24
120.0000 mg | ORAL_CAPSULE | Freq: Every day | ORAL | Status: DC
  Administered 2023-07-14: 120 mg via ORAL
  Filled 2023-07-13: qty 1

## 2023-07-13 MED ORDER — CEFAZOLIN SODIUM-DEXTROSE 2-4 GM/100ML-% IV SOLN
INTRAVENOUS | Status: AC
  Filled 2023-07-13: qty 100

## 2023-07-13 MED ORDER — INSULIN ASPART 100 UNIT/ML IJ SOLN
0.0000 [IU] | Freq: Three times a day (TID) | INTRAMUSCULAR | Status: DC
  Administered 2023-07-13: 3 [IU] via SUBCUTANEOUS
  Administered 2023-07-14: 2 [IU] via SUBCUTANEOUS
  Administered 2023-07-14: 5 [IU] via SUBCUTANEOUS

## 2023-07-13 MED ORDER — ACETAMINOPHEN 325 MG PO TABS: 325.0000 mg | ORAL_TABLET | Freq: Four times a day (QID) | ORAL | Status: DC | PRN

## 2023-07-13 MED ORDER — ATORVASTATIN CALCIUM 10 MG PO TABS
10.0000 mg | ORAL_TABLET | Freq: Every day | ORAL | Status: DC
  Administered 2023-07-14: 10 mg via ORAL
  Filled 2023-07-13: qty 1

## 2023-07-13 MED ORDER — ONDANSETRON HCL 4 MG PO TABS: 4.0000 mg | ORAL_TABLET | Freq: Four times a day (QID) | ORAL | Status: DC | PRN

## 2023-07-13 MED ORDER — PROPOFOL 500 MG/50ML IV EMUL
INTRAVENOUS | Status: DC | PRN
Start: 2023-07-13 — End: 2023-07-13
  Administered 2023-07-13: 40 ug/kg/min via INTRAVENOUS

## 2023-07-13 MED ORDER — LACTATED RINGERS IV SOLN: INTRAVENOUS | Status: DC

## 2023-07-13 MED ORDER — MORPHINE SULFATE (PF) 2 MG/ML IV SOLN: 0.5000 mg | INTRAVENOUS | Status: DC | PRN

## 2023-07-13 MED ORDER — BUPIVACAINE IN DEXTROSE 0.75-8.25 % IT SOLN
INTRATHECAL | Status: DC | PRN
Start: 2023-07-13 — End: 2023-07-13
  Administered 2023-07-13: 1.6 mL via INTRATHECAL

## 2023-07-13 MED ORDER — CEFAZOLIN SODIUM-DEXTROSE 2-4 GM/100ML-% IV SOLN
2.0000 g | Freq: Four times a day (QID) | INTRAVENOUS | Status: AC
  Administered 2023-07-13 – 2023-07-14 (×2): 2 g via INTRAVENOUS
  Filled 2023-07-13 (×2): qty 100

## 2023-07-13 MED ORDER — DIPHENHYDRAMINE HCL 12.5 MG/5ML PO ELIX: 12.5000 mg | ORAL_SOLUTION | ORAL | Status: DC | PRN

## 2023-07-13 MED ORDER — INSULIN ASPART 100 UNIT/ML IJ SOLN: 0.0000 [IU] | Freq: Three times a day (TID) | INTRAMUSCULAR | Status: DC

## 2023-07-13 MED ORDER — OXYCODONE HCL 5 MG PO TABS: 5.0000 mg | ORAL_TABLET | Freq: Once | ORAL | Status: DC | PRN

## 2023-07-13 MED ORDER — METOCLOPRAMIDE HCL 5 MG/ML IJ SOLN: 5.0000 mg | Freq: Three times a day (TID) | INTRAMUSCULAR | Status: DC | PRN

## 2023-07-13 MED ORDER — KETOROLAC TROMETHAMINE 30 MG/ML IJ SOLN
INTRAMUSCULAR | Status: AC
  Filled 2023-07-13: qty 1

## 2023-07-13 MED ORDER — POVIDONE-IODINE 10 % EX SWAB: 2.0000 | Freq: Once | CUTANEOUS | Status: DC

## 2023-07-13 MED ORDER — ONDANSETRON HCL 4 MG/2ML IJ SOLN: 4.0000 mg | Freq: Four times a day (QID) | INTRAMUSCULAR | Status: DC | PRN

## 2023-07-13 MED ORDER — PHENOL 1.4 % MT LIQD: 1.0000 | OROMUCOSAL | Status: DC | PRN

## 2023-07-13 MED ORDER — CALCIUM CARBONATE 1250 (500 CA) MG PO TABS
500.0000 mg | ORAL_TABLET | Freq: Two times a day (BID) | ORAL | Status: DC
  Administered 2023-07-13 – 2023-07-14 (×2): 1250 mg via ORAL
  Filled 2023-07-13 (×2): qty 1

## 2023-07-13 MED ORDER — PROPOFOL 1000 MG/100ML IV EMUL
INTRAVENOUS | Status: AC
  Filled 2023-07-13: qty 100

## 2023-07-13 MED ORDER — ONDANSETRON HCL 4 MG/2ML IJ SOLN
INTRAMUSCULAR | Status: DC | PRN
Start: 2023-07-13 — End: 2023-07-13
  Administered 2023-07-13: 4 mg via INTRAVENOUS

## 2023-07-13 MED ORDER — ONDANSETRON HCL 4 MG/2ML IJ SOLN
INTRAMUSCULAR | Status: AC
  Filled 2023-07-13: qty 2

## 2023-07-13 MED ORDER — POLYVINYL ALCOHOL 1.4 % OP SOLN: 1.0000 [drp] | Freq: Three times a day (TID) | OPHTHALMIC | Status: DC | PRN

## 2023-07-13 MED ORDER — ORAL CARE MOUTH RINSE: 15.0000 mL | Freq: Once | OROMUCOSAL | Status: AC

## 2023-07-13 MED ORDER — DEXAMETHASONE SODIUM PHOSPHATE 10 MG/ML IJ SOLN
INTRAMUSCULAR | Status: DC | PRN
Start: 2023-07-13 — End: 2023-07-13
  Administered 2023-07-13: 5 mg via INTRAVENOUS

## 2023-07-13 MED ORDER — SENNA 8.6 MG PO TABS
1.0000 | ORAL_TABLET | Freq: Two times a day (BID) | ORAL | Status: DC
  Administered 2023-07-13: 8.6 mg via ORAL
  Filled 2023-07-13 (×2): qty 1

## 2023-07-13 MED ORDER — MENTHOL 3 MG MT LOZG: 1.0000 | LOZENGE | OROMUCOSAL | Status: DC | PRN

## 2023-07-13 MED ORDER — 0.9 % SODIUM CHLORIDE (POUR BTL) OPTIME
TOPICAL | Status: DC | PRN
Start: 2023-07-13 — End: 2023-07-13
  Administered 2023-07-13: 1000 mL

## 2023-07-13 MED ORDER — SODIUM CHLORIDE 0.9 % IV SOLN: INTRAVENOUS | Status: DC

## 2023-07-13 MED ORDER — ACETAMINOPHEN 500 MG PO TABS
ORAL_TABLET | ORAL | Status: AC
  Administered 2023-07-13: 1000 mg via ORAL
  Filled 2023-07-13: qty 2

## 2023-07-13 MED ORDER — RIVAROXABAN 10 MG PO TABS
10.0000 mg | ORAL_TABLET | Freq: Every day | ORAL | Status: DC
  Administered 2023-07-14: 10 mg via ORAL
  Filled 2023-07-13: qty 1

## 2023-07-13 MED ORDER — ONDANSETRON HCL 4 MG/2ML IJ SOLN: 4.0000 mg | Freq: Once | INTRAMUSCULAR | Status: DC | PRN

## 2023-07-13 MED ORDER — DEXAMETHASONE SODIUM PHOSPHATE 10 MG/ML IJ SOLN
INTRAMUSCULAR | Status: AC
  Filled 2023-07-13: qty 1

## 2023-07-13 MED ORDER — TRANEXAMIC ACID-NACL 1000-0.7 MG/100ML-% IV SOLN
INTRAVENOUS | Status: AC
  Filled 2023-07-13: qty 100

## 2023-07-13 MED ORDER — TRANEXAMIC ACID-NACL 1000-0.7 MG/100ML-% IV SOLN
1000.0000 mg | INTRAVENOUS | Status: AC
  Administered 2023-07-13: 1000 mg via INTRAVENOUS

## 2023-07-13 MED ORDER — ALBUMIN HUMAN 5 % IV SOLN: INTRAVENOUS | Status: DC | PRN

## 2023-07-13 MED ORDER — CHLORHEXIDINE GLUCONATE 0.12 % MT SOLN
15.0000 mL | Freq: Once | OROMUCOSAL | Status: AC
  Administered 2023-07-13: 15 mL via OROMUCOSAL

## 2023-07-13 MED ORDER — ISOPROPYL ALCOHOL 70 % SOLN
Status: DC | PRN
  Administered 2023-07-13: 1 via TOPICAL

## 2023-07-13 MED ORDER — PROPOFOL 10 MG/ML IV BOLUS
INTRAVENOUS | Status: DC | PRN
Start: 2023-07-13 — End: 2023-07-13
  Administered 2023-07-13: 40 mg via INTRAVENOUS
  Administered 2023-07-13: 20 mg via INTRAVENOUS

## 2023-07-13 MED ORDER — PHENYLEPHRINE HCL-NACL 20-0.9 MG/250ML-% IV SOLN
INTRAVENOUS | Status: DC | PRN
Start: 2023-07-13 — End: 2023-07-13
  Administered 2023-07-13: 15 ug/min via INTRAVENOUS

## 2023-07-13 MED ORDER — HYDROCODONE-ACETAMINOPHEN 7.5-325 MG PO TABS
ORAL_TABLET | ORAL | Status: AC
  Filled 2023-07-13: qty 1

## 2023-07-13 MED ORDER — ACETAMINOPHEN 500 MG PO TABS: 1000.0000 mg | ORAL_TABLET | Freq: Once | ORAL | Status: AC

## 2023-07-13 MED ORDER — CEFAZOLIN SODIUM-DEXTROSE 2-4 GM/100ML-% IV SOLN
2.0000 g | INTRAVENOUS | Status: AC
  Administered 2023-07-13: 2 g via INTRAVENOUS

## 2023-07-13 MED ORDER — WATER FOR IRRIGATION, STERILE IR SOLN
Status: DC | PRN
  Administered 2023-07-13: 2000 mL

## 2023-07-13 MED ORDER — PANTOPRAZOLE SODIUM 40 MG PO TBEC
40.0000 mg | DELAYED_RELEASE_TABLET | Freq: Every day | ORAL | Status: DC
  Administered 2023-07-14: 40 mg via ORAL
  Filled 2023-07-13 (×2): qty 1

## 2023-07-13 MED ORDER — METHOCARBAMOL 500 MG PO TABS
500.0000 mg | ORAL_TABLET | Freq: Four times a day (QID) | ORAL | Status: DC | PRN
  Administered 2023-07-14: 500 mg via ORAL
  Filled 2023-07-13 (×2): qty 1

## 2023-07-13 MED ORDER — METOPROLOL SUCCINATE ER 50 MG PO TB24
50.0000 mg | ORAL_TABLET | Freq: Two times a day (BID) | ORAL | Status: DC
  Administered 2023-07-13 – 2023-07-14 (×2): 50 mg via ORAL
  Filled 2023-07-13 (×2): qty 1

## 2023-07-13 MED ORDER — SODIUM CHLORIDE (PF) 0.9 % IJ SOLN
INTRAMUSCULAR | Status: AC
  Filled 2023-07-13: qty 50

## 2023-07-13 MED ORDER — SODIUM CHLORIDE 0.9 % IR SOLN
Status: DC | PRN
  Administered 2023-07-13: 1000 mL
  Administered 2023-07-13: 3000 mL

## 2023-07-13 MED ORDER — METHOCARBAMOL 500 MG IVPB - SIMPLE MED
500.0000 mg | Freq: Four times a day (QID) | INTRAVENOUS | Status: DC | PRN
  Administered 2023-07-13: 500 mg via INTRAVENOUS

## 2023-07-13 MED ORDER — GABAPENTIN 300 MG PO CAPS
600.0000 mg | ORAL_CAPSULE | Freq: Every day | ORAL | Status: DC
  Administered 2023-07-13: 600 mg via ORAL
  Filled 2023-07-13: qty 2

## 2023-07-13 MED ORDER — PRONTOSAN WOUND IRRIGATION OPTIME
TOPICAL | Status: DC | PRN
Start: 2023-07-13 — End: 2023-07-13
  Administered 2023-07-13: 1 via TOPICAL

## 2023-07-13 MED ORDER — ALUM & MAG HYDROXIDE-SIMETH 200-200-20 MG/5ML PO SUSP: 30.0000 mL | ORAL | Status: DC | PRN

## 2023-07-13 MED ORDER — DOCUSATE SODIUM 100 MG PO CAPS
100.0000 mg | ORAL_CAPSULE | Freq: Two times a day (BID) | ORAL | Status: DC
  Administered 2023-07-13: 100 mg via ORAL
  Filled 2023-07-13 (×2): qty 1

## 2023-07-13 MED ORDER — BUPIVACAINE-EPINEPHRINE 0.25% -1:200000 IJ SOLN
INTRAMUSCULAR | Status: AC
  Filled 2023-07-13: qty 1

## 2023-07-13 SURGICAL SUPPLY — 57 items
ADH SKN CLS APL DERMABOND .7 (GAUZE/BANDAGES/DRESSINGS) ×2
APL PRP STRL LF DISP 70% ISPRP (MISCELLANEOUS) ×1
BAG COUNTER SPONGE SURGICOUNT (BAG) IMPLANT
BAG SPEC THK2 15X12 ZIP CLS (MISCELLANEOUS)
BAG SPNG CNTER NS LX DISP (BAG)
BAG ZIPLOCK 12X15 (MISCELLANEOUS) IMPLANT
CHLORAPREP W/TINT 26 (MISCELLANEOUS) ×2 IMPLANT
COVER PERINEAL POST (MISCELLANEOUS) ×2 IMPLANT
COVER SURGICAL LIGHT HANDLE (MISCELLANEOUS) ×2 IMPLANT
DERMABOND ADVANCED .7 DNX12 (GAUZE/BANDAGES/DRESSINGS) ×4 IMPLANT
DRAPE IMP U-DRAPE 54X76 (DRAPES) ×2 IMPLANT
DRAPE SHEET LG 3/4 BI-LAMINATE (DRAPES) ×6 IMPLANT
DRAPE STERI IOBAN 125X83 (DRAPES) ×2 IMPLANT
DRAPE U-SHAPE 47X51 STRL (DRAPES) ×2 IMPLANT
DRESSING AQUACEL AG SP 3.5X10 (GAUZE/BANDAGES/DRESSINGS) IMPLANT
DRSG AQUACEL AG ADV 3.5X10 (GAUZE/BANDAGES/DRESSINGS) ×2 IMPLANT
DRSG AQUACEL AG SP 3.5X10 (GAUZE/BANDAGES/DRESSINGS) ×1
ELECT REM PT RETURN 15FT ADLT (MISCELLANEOUS) ×2 IMPLANT
GAUZE SPONGE 4X4 12PLY STRL (GAUZE/BANDAGES/DRESSINGS) ×2 IMPLANT
GLOVE BIO SURGEON STRL SZ7 (GLOVE) ×2 IMPLANT
GLOVE BIO SURGEON STRL SZ8.5 (GLOVE) ×4 IMPLANT
GLOVE BIOGEL PI IND STRL 7.5 (GLOVE) ×2 IMPLANT
GLOVE BIOGEL PI IND STRL 8.5 (GLOVE) ×2 IMPLANT
GOWN SPEC L3 XXLG W/TWL (GOWN DISPOSABLE) ×2 IMPLANT
GOWN STRL REUS W/ TWL XL LVL3 (GOWN DISPOSABLE) ×2 IMPLANT
GOWN STRL REUS W/TWL XL LVL3 (GOWN DISPOSABLE) ×1
HANDPIECE INTERPULSE COAX TIP (DISPOSABLE) ×1
HEAD FEM -6XOFST 36XMDLR (Orthopedic Implant) IMPLANT
HEAD MODULAR 36MM (Orthopedic Implant) ×1 IMPLANT
HOLDER FOLEY CATH W/STRAP (MISCELLANEOUS) ×2 IMPLANT
HOOD PEEL AWAY T7 (MISCELLANEOUS) ×6 IMPLANT
KIT TURNOVER KIT A (KITS) IMPLANT
LINER ACETAB ~~LOC~~ D 36 (Liner) IMPLANT
MANIFOLD NEPTUNE II (INSTRUMENTS) ×2 IMPLANT
MARKER SKIN DUAL TIP RULER LAB (MISCELLANEOUS) ×2 IMPLANT
NDL SAFETY ECLIP 18X1.5 (MISCELLANEOUS) ×2 IMPLANT
NDL SPNL 18GX3.5 QUINCKE PK (NEEDLE) ×2 IMPLANT
NEEDLE SPNL 18GX3.5 QUINCKE PK (NEEDLE) ×1 IMPLANT
PACK ANTERIOR HIP CUSTOM (KITS) ×2 IMPLANT
SAW OSC TIP CART 19.5X105X1.3 (SAW) ×2 IMPLANT
SEALER BIPOLAR AQUA 6.0 (INSTRUMENTS) ×2 IMPLANT
SET HNDPC FAN SPRY TIP SCT (DISPOSABLE) ×2 IMPLANT
SHELL ACET G7 3H 50 SZD (Shell) IMPLANT
SOLUTION PRONTOSAN WOUND 350ML (IRRIGATION / IRRIGATOR) ×2 IMPLANT
SPIKE FLUID TRANSFER (MISCELLANEOUS) ×2 IMPLANT
STEM FEMORAL TAPERLOC 13X111 (Stem) IMPLANT
SUT MNCRL AB 3-0 PS2 18 (SUTURE) ×2 IMPLANT
SUT MON AB 2-0 CT1 36 (SUTURE) ×2 IMPLANT
SUT STRATAFIX PDO 1 14 VIOLET (SUTURE) ×1
SUT STRATFX PDO 1 14 VIOLET (SUTURE) ×1
SUT VIC AB 2-0 CT1 27 (SUTURE)
SUT VIC AB 2-0 CT1 TAPERPNT 27 (SUTURE) IMPLANT
SUTURE STRATFX PDO 1 14 VIOLET (SUTURE) ×2 IMPLANT
SYR 3ML LL SCALE MARK (SYRINGE) ×2 IMPLANT
TRAY FOLEY MTR SLVR 16FR STAT (SET/KITS/TRAYS/PACK) IMPLANT
TUBE SUCTION HIGH CAP CLEAR NV (SUCTIONS) ×2 IMPLANT
WATER STERILE IRR 1000ML POUR (IV SOLUTION) ×2 IMPLANT

## 2023-07-13 NOTE — Op Note (Signed)
OPERATIVE REPORT  SURGEON: Samson Frederic, MD   ASSISTANT: Clint Bolder, PA-C.  PREOPERATIVE DIAGNOSIS: Left hip arthritis.   POSTOPERATIVE DIAGNOSIS: Left hip arthritis.   PROCEDURE: Left total hip arthroplasty, anterior approach.   IMPLANTS: Biomet Taperloc Complete Microplasty stem, size 14 x 113 mm, high offset. Biomet G7 OsseoTi Cup, size 50 mm. Biomet Vivacit-E liner, size 36 mm, D, neutral. Biomet Biolox ceramic head ball, size 36 - 3 mm.  ANESTHESIA:  MAC and Spinal  ESTIMATED BLOOD LOSS: 300 mL    ANTIBIOTICS: 2g Ancef.  DRAINS: None.  COMPLICATIONS: None.   CONDITION: PACU - hemodynamically stable.   BRIEF CLINICAL NOTE: Danielle [name] is a 88 y.o. female with a long-standing history of Left hip arthritis. After failing conservative management, the patient was indicated for total hip arthroplasty. The risks, benefits, and alternatives to the procedure were explained, and the patient elected to proceed.  PROCEDURE IN DETAIL: Surgical site was marked by myself in the pre-op holding area. Once inside the operating room, spinal anesthesia was obtained, and a foley catheter was inserted. The patient was then positioned on the Hana table.  All bony prominences were well padded.  The hip was prepped and draped in the normal sterile surgical fashion.  A time-out was called verifying side and site of surgery. The patient received IV antibiotics within 60 minutes of beginning the procedure.   Bikini incision was made, and superficial dissection was performed lateral to the ASIS. The direct anterior approach to the hip was performed through the Hueter interval.  Lateral femoral circumflex vessels were treated with the Auqumantys. The anterior capsule was exposed and an inverted T capsulotomy was made. The femoral neck cut was made to the level of the templated cut.  A corkscrew was placed into the head and the head was removed.  The femoral head was found to have eburnated bone.  The head was passed to the back table and was measured. Pubofemoral ligament was released off of the calcar, taking care to stay on bone. Superior capsule was released from the greater trochanter, taking care to stay lateral to the posterior border of the femoral neck in order to preserve the short external rotators.   Acetabular exposure was achieved, and the pulvinar and labrum were excised. Sequential reaming of the acetabulum was then performed up to a size 49 mm reamer. A 50 mm cup was then opened and impacted into place at approximately 40 degrees of abduction and 20 degrees of anteversion. The final polyethylene liner was impacted into place and acetabular osteophytes were removed.    I then gained femoral exposure taking care to protect the abductors and greater trochanter.  This was performed using standard external rotation, extension, and adduction.  A cookie cutter was used to enter the femoral canal, and then the femoral canal finder was placed.  Sequential broaching was performed up to a size 14.  Calcar planer was used on the femoral neck remnant.  I placed a high offset neck and a trial head ball.  The hip was reduced.  Leg lengths and offset were checked fluoroscopically.  The hip was dislocated and trial components were removed.  The final implants were placed, and the hip was reduced.  Fluoroscopy was used to confirm component position and leg lengths.  At 90 degrees of external rotation and full extension, the hip was stable to an anterior directed force.   The wound was copiously irrigated with Prontosan solution and normal saline using pulse lavage.  Marcaine solution was injected into the periarticular soft tissue.  The wound was closed in layers using #1 Vicryl and V-Loc for the fascia, 2-0 Vicryl for the subcutaneous fat, 2-0 Monocryl for the deep dermal layer, 3-0 running Monocryl subcuticular stitch, and Dermabond for the skin.  Once the glue was fully dried, an Aquacell Ag dressing  was applied.  The patient was transported to the recovery room in stable condition.  Sponge, needle, and instrument counts were correct at the end of the case x2.  The patient tolerated the procedure well and there were no known complications.  The aquamantis was utilized for this case to help facilitate better hemostasis as patient was felt to be at increased risk of bleeding because of recent anticoagulation use.  A oscillating saw tip was utilized for this case to prevent damage to the soft tissue structures such as muscles, ligaments and tendons, and to ensure accurate bone cuts. This patient was at increased risk for above structures due to  minimally invasive approach.  Please note that a surgical assistant was a medical necessity for this procedure to perform it in a safe and expeditious manner. Assistant was necessary to provide appropriate retraction of vital neurovascular structures, to prevent femoral fracture, and to allow for anatomic placement of the prosthesis.

## 2023-07-13 NOTE — Interval H&P Note (Signed)
History and Physical Interval Note:  07/13/2023 9:55 AM  Danielle [name]  has presented today for surgery, with the diagnosis of Left hip osteoarthritis.  The various methods of treatment have been discussed with the patient and family. After consideration of risks, benefits and other options for treatment, the patient has consented to  Procedure(s): TOTAL HIP ARTHROPLASTY ANTERIOR APPROACH (Left) as a surgical intervention.  The patient's history has been reviewed, patient examined, no change in status, stable for surgery.  I have reviewed the patient's chart and labs.  Questions were answered to the patient's satisfaction.     [name]

## 2023-07-13 NOTE — Op Note (Signed)
OPERATIVE REPORT  SURGEON: Samson Frederic, MD   ASSISTANT: Clint Bolder, PA-C.  PREOPERATIVE DIAGNOSIS: Left hip arthritis.   POSTOPERATIVE DIAGNOSIS: Left hip arthritis.   PROCEDURE: Left total hip arthroplasty, anterior approach.   IMPLANTS: Biomet Taperloc Complete Microplasty stem, size 13 x 111 mm, high offset. Biomet G7 OsseoTi Cup, size 50 mm. Biomet Vivacit-E liner, size 36 mm, D, neutral. Biomet metal head ball, size 36 - 3 mm.  ANESTHESIA:  MAC and Spinal  ESTIMATED BLOOD LOSS:-250 mL    ANTIBIOTICS: 2g Ancef.  DRAINS: None.  COMPLICATIONS: None.   CONDITION: PACU - hemodynamically stable.   BRIEF CLINICAL NOTE: Danielle [name] is a 88 y.o. female with a long-standing history of Left hip arthritis. After failing conservative management, the patient was indicated for total hip arthroplasty. The risks, benefits, and alternatives to the procedure were explained, and the patient elected to proceed.  PROCEDURE IN DETAIL: Surgical site was marked by myself in the pre-op holding area. Once inside the operating room, spinal anesthesia was obtained, and a foley catheter was inserted. The patient was then positioned on the Hana table.  All bony prominences were well padded.  The hip was prepped and draped in the normal sterile surgical fashion.  A time-out was called verifying side and site of surgery. The patient received IV antibiotics within 60 minutes of beginning the procedure.   Bikini incision was made, and superficial dissection was performed lateral to the ASIS. The direct anterior approach to the hip was performed through the Hueter interval.  Lateral femoral circumflex vessels were treated with the Auqumantys. The anterior capsule was exposed and an inverted T capsulotomy was made. The femoral neck cut was made to the level of the templated cut.  A corkscrew was placed into the head and the head was removed.  The femoral head was found to have eburnated bone. The head  was passed to the back table and was measured. Pubofemoral ligament was released off of the calcar, taking care to stay on bone. Superior capsule was released from the greater trochanter, taking care to stay lateral to the posterior border of the femoral neck in order to preserve the short external rotators.   Acetabular exposure was achieved, and the pulvinar and labrum were excised. Sequential reaming of the acetabulum was then performed up to a size 49 mm reamer. A 50 mm cup was then opened and impacted into place at approximately 40 degrees of abduction and 20 degrees of anteversion. The final polyethylene liner was impacted into place and acetabular osteophytes were removed.    I then gained femoral exposure taking care to protect the abductors and greater trochanter.  This was performed using standard external rotation, extension, and adduction.  A cookie cutter was used to enter the femoral canal, and then the femoral canal finder was placed.  Sequential broaching was performed up to a size 13.  Calcar planer was used on the femoral neck remnant.  I placed a high offset neck and a trial head ball.  The hip was reduced.  Leg lengths and offset were checked fluoroscopically.  The hip was dislocated and trial components were removed.  The final implants were placed, and the hip was reduced.  Fluoroscopy was used to confirm component position and leg lengths.  At 90 degrees of external rotation and full extension, the hip was stable to an anterior directed force.   The wound was copiously irrigated with Prontosan solution and normal saline using pulse lavage.  Marcaine  solution was injected into the periarticular soft tissue.  The wound was closed in layers using #1 Vicryl and V-Loc for the fascia, 2-0 Vicryl for the subcutaneous fat, 2-0 Monocryl for the deep dermal layer, 3-0 running Monocryl subcuticular stitch, and Dermabond for the skin.  Once the glue was fully dried, an Aquacell Ag dressing was  applied.  The patient was transported to the recovery room in stable condition.  Sponge, needle, and instrument counts were correct at the end of the case x2.  The patient tolerated the procedure well and there were no known complications.  The aquamantis was utilized for this case to help facilitate better hemostasis as patient was felt to be at increased risk of bleeding because of recent anticoagulation use.  A oscillating saw tip was utilized for this case to prevent damage to the soft tissue structures such as muscles, ligaments and tendons, and to ensure accurate bone cuts. This patient was at increased risk for above structures due to  minimally invasive approach.  Please note that a surgical assistant was a medical necessity for this procedure to perform it in a safe and expeditious manner. Assistant was necessary to provide appropriate retraction of vital neurovascular structures, to prevent femoral fracture, and to allow for anatomic placement of the prosthesis.

## 2023-07-13 NOTE — Anesthesia Procedure Notes (Signed)
Date/Time: 07/13/2023 11:28 AM  Performed by: Shary Decamp, CRNAPre-anesthesia Checklist: Patient identified, Emergency Drugs available, Suction available, Timeout performed and Patient being monitored Patient Re-evaluated:Patient Re-evaluated prior to induction Oxygen Delivery Method: Simple face mask

## 2023-07-13 NOTE — Care Plan (Signed)
Ortho Bundle Case Management Note  Patient Details  Name: TAKISHIA GRILLIOT MRN: [id number] Date of Birth: December 11, 1934                  L THA on 07-13-23  DCP: Home with dtrs  DME: No needs, has RW  PT: [facility]   DME Arranged:  N/A DME Agency:       Additional Comments: Please contact me with any questions of if this plan should need to change.   Ennis Forts, RN,CCM [facility]  [phone] 07/13/2023, 9:52 AM

## 2023-07-13 NOTE — Transfer of Care (Signed)
Immediate Anesthesia Transfer of Care Note  Patient: Danielle [name]  Procedure(s) Performed: TOTAL HIP ARTHROPLASTY ANTERIOR APPROACH (Left: Hip)  Patient Location: PACU  Anesthesia Type:Spinal  Level of Consciousness: drowsy, patient cooperative, and responds to stimulation  Airway & Oxygen Therapy: Patient Spontanous Breathing and Patient connected to face mask oxygen  Post-op Assessment: Report given to RN and Post -op Vital signs reviewed and stable  Post vital signs: Reviewed and stable  Last Vitals:  Vitals Value Taken Time  BP 103/56 07/13/23 1319  Temp 36.4 C 07/13/23 1319  Pulse 55 07/13/23 1326  Resp 13 07/13/23 1326  SpO2 94 % 07/13/23 1326  Vitals shown include unfiled device data.  Last Pain:  Vitals:   07/13/23 1319  TempSrc:   PainSc: 0-No pain         Complications: No notable events documented.

## 2023-07-13 NOTE — Anesthesia Postprocedure Evaluation (Signed)
Anesthesia Post Note  Patient: LAJLA HILBUN  Procedure(s) Performed: TOTAL HIP ARTHROPLASTY ANTERIOR APPROACH (Left: Hip)     Patient location during evaluation: PACU Anesthesia Type: Spinal Level of consciousness: awake and alert Pain management: pain level controlled Vital Signs Assessment: post-procedure vital signs reviewed and stable Respiratory status: spontaneous breathing and respiratory function stable Cardiovascular status: blood pressure returned to baseline and stable Postop Assessment: spinal receding and no apparent nausea or vomiting Anesthetic complications: no   No notable events documented.  Last Vitals:  Vitals:   07/13/23 1345 07/13/23 1400  BP: (!) 112/51 (!) 115/56  Pulse: (!) 57 (!) 54  Resp: 10 11  Temp:    SpO2: 96% 98%    Last Pain:  Vitals:   07/13/23 1400  TempSrc:   PainSc: 0-No pain                 Beryle Lathe

## 2023-07-13 NOTE — Anesthesia Procedure Notes (Signed)
Spinal  Patient location during procedure: OR Start time: 07/13/2023 11:29 AM End time: 07/13/2023 11:32 AM Reason for block: surgical anesthesia Staffing Performed: anesthesiologist  Anesthesiologist: Beryle Lathe, MD Performed by: Beryle Lathe, MD Authorized by: Beryle Lathe, MD   Preanesthetic Checklist Completed: patient identified, IV checked, risks and benefits discussed, surgical consent, monitors and equipment checked, pre-op evaluation and timeout performed Spinal Block Patient position: sitting Prep: DuraPrep Patient monitoring: heart rate, cardiac monitor, continuous pulse ox and blood pressure Approach: midline Location: L3-4 Injection technique: single-shot Needle Needle type: Quincke  Needle gauge: 22 G Additional Notes Consent was obtained prior to the procedure with all questions answered and concerns addressed. Risks including, but not limited to, bleeding, infection, nerve damage, paralysis, failed block, inadequate analgesia, allergic reaction, high spinal, itching, and headache were discussed and the patient wished to proceed. Functioning IV was confirmed and monitors were applied. Sterile prep and drape, including hand hygiene, mask, and sterile gloves were used. The patient was positioned and the spine was prepped. The skin was anesthetized with lidocaine. Free flow of clear CSF was obtained prior to injecting local anesthetic into the CSF. The spinal needle aspirated freely following injection. The needle was carefully withdrawn. The patient tolerated the procedure well.   Leslye Peer, MD

## 2023-07-13 NOTE — Plan of Care (Signed)
  Problem: Education: Goal: Ability to describe self-care measures that may prevent or decrease complications (Diabetes Survival Skills Education) will improve Outcome: Progressing   Problem: Coping: Goal: Ability to adjust to condition or change in health will improve Outcome: Progressing   Problem: Fluid Volume: Goal: Ability to maintain a balanced intake and output will improve Outcome: Progressing   Problem: Health Behavior/Discharge Planning: Goal: Ability to identify and utilize available resources and services will improve Outcome: Progressing   Problem: Skin Integrity: Goal: Risk for impaired skin integrity will decrease Outcome: Progressing

## 2023-07-13 NOTE — Discharge Instructions (Signed)
? ?Dr. Arlys John Swinteck ?Joint Replacement Specialist ?[facility] ?[address] ?[place], [place] [address] ?[phone] ? ? ?TOTAL HIP REPLACEMENT POSTOPERATIVE DIRECTIONS ? ? ? ?Hip Rehabilitation, Guidelines Following Surgery  ? ?WEIGHT BEARING ?Weight bearing as tolerated with assist device (walker, cane, etc) as directed, use it as long as suggested by your surgeon or therapist, typically at least 4-6 weeks. ? ?The results of a hip operation are greatly improved after range of motion and muscle strengthening exercises. Follow all safety measures which are given to protect your hip. If any of these exercises cause increased pain or swelling in your joint, decrease the amount until you are comfortable again. Then slowly increase the exercises. Call your caregiver if you have problems or questions.  ? ?HOME CARE INSTRUCTIONS  ?Most of the following instructions are designed to prevent the dislocation of your new hip.  ?Remove items at home which could result in a fall. This includes throw rugs or furniture in walking pathways.  ?Continue medications as instructed at time of discharge. ?You may have some home medications which will be placed on hold until you complete the course of blood thinner medication. ?You may start showering once you are discharged home. Do not remove your dressing. ?Do not put on socks or shoes without following the instructions of your caregivers.   ?Sit on chairs with arms. Use the chair arms to help push yourself up when arising.  ?Arrange for the use of a toilet seat elevator so you are not sitting low.  ?Walk with walker as instructed.  ?You may resume a sexual relationship in one month or when given the OK by your caregiver.  ?Use walker as long as suggested by your caregivers.  ?You may put full weight on your legs and walk as much as is comfortable. ?Avoid periods of inactivity such as sitting longer than an hour when not asleep. This helps prevent blood  clots.  ?You may return to work once you are cleared by Designer, industrial/product.  ?Do not drive a car for 6 weeks or until released by your surgeon.  ?Do not drive while taking narcotics.  ?Wear elastic stockings for two weeks following surgery during the day but you may remove then at night.  ?Make sure you keep all of your appointments after your operation with all of your doctors and caregivers. You should call the office at the above phone number and make an appointment for approximately two weeks after the date of your surgery. ?Please pick up a stool softener and laxative for home use as long as you are requiring pain medications. ?ICE to the affected hip every three hours for 30 minutes at a time and then as needed for pain and swelling. Continue to use ice on the hip for pain and swelling from surgery. You may notice swelling that will progress down to the foot and ankle.  This is normal after surgery.  Elevate the leg when you are not up walking on it.   ?It is important for you to complete the blood thinner medication as prescribed by your doctor. ?Continue to use the breathing machine which will help keep your temperature down.  It is common for your temperature to cycle up and down following surgery, especially at night when you are not up moving around and exerting yourself.  The breathing machine keeps your lungs expanded and your temperature down. ? ?RANGE OF MOTION AND STRENGTHENING EXERCISES  ?These exercises are designed to help you  keep full movement of your hip joint. Follow your caregiver's or physical therapist's instructions. Perform all exercises about fifteen times, three times per day or as directed. Exercise both hips, even if you have had only one joint replacement. These exercises can be done on a training (exercise) mat, on the floor, on a table or on a bed. Use whatever works the best and is most comfortable for you. Use music or television while you are exercising so that the exercises are a  pleasant break in your day. This will make your life better with the exercises acting as a break in routine you can look forward to.  ?Lying on your back, slowly slide your foot toward your buttocks, raising your knee up off the floor. Then slowly slide your foot back down until your leg is straight again.  ?Lying on your back spread your legs as far apart as you can without causing discomfort.  ?Lying on your side, raise your upper leg and foot straight up from the floor as far as is comfortable. Slowly lower the leg and repeat.  ?Lying on your back, tighten up the muscle in the front of your thigh (quadriceps muscles). You can do this by keeping your leg straight and trying to raise your heel off the floor. This helps strengthen the largest muscle supporting your knee.  ?Lying on your back, tighten up the muscles of your buttocks both with the legs straight and with the knee bent at a comfortable angle while keeping your heel on the floor.  ? ?SKILLED REHAB INSTRUCTIONS: ?If the patient is transferred to a skilled rehab facility following release from the hospital, a list of the current medications will be sent to the facility for the patient to continue.  When discharged from the skilled rehab facility, please have the facility set up the patient's Home Health Physical Therapy prior to being released. Also, the skilled facility will be responsible for providing the patient with their medications at time of release from the facility to include their pain medication and their blood thinner medication. If the patient is still at the rehab facility at time of the two week follow up appointment, the skilled rehab facility will also need to assist the patient in arranging follow up appointment in our office and any transportation needs. ? ?POST-OPERATIVE OPIOID TAPER INSTRUCTIONS: ?It is important to wean off of your opioid medication as soon as possible. If you do not need pain medication after your surgery it is ok  to stop day one. ?Opioids include: ?Codeine, Hydrocodone(Norco, Vicodin), Oxycodone(Percocet, oxycontin) and hydromorphone amongst others.  ?Long term and even short term use of opiods can cause: ?Increased pain response ?Dependence ?Constipation ?Depression ?Respiratory depression ?And more.  ?Withdrawal symptoms can include ?Flu like symptoms ?Nausea, vomiting ?And more ?Techniques to manage these symptoms ?Hydrate well ?Eat regular healthy meals ?Stay active ?Use relaxation techniques(deep breathing, meditating, yoga) ?Do Not substitute Alcohol to help with tapering ?If you have been on opioids for less than two weeks and do not have pain than it is ok to stop all together.  ?Plan to wean off of opioids ?This plan should start within one week post op of your joint replacement. ?Maintain the same interval or time between taking each dose and first decrease the dose.  ?Cut the total daily intake of opioids by one tablet each day ?Next start to increase the time between doses. ?The last dose that should be eliminated is the evening dose.  ? ? ?MAKE  SURE YOU:  ?Understand these instructions.  ?Will watch your condition.  ?Will get help right away if you are not doing well or get worse. ? ?Pick up stool softner and laxative for home use following surgery while on pain medications. ?Do not remove your dressing. ?The dressing is waterproof--it is OK to take showers. ?Continue to use ice for pain and swelling after surgery. ?Do not use any lotions or creams on the incision until instructed by your surgeon. ?Total Hip Protocol. ? ?

## 2023-07-14 ENCOUNTER — Encounter (HOSPITAL_COMMUNITY): Payer: Self-pay | Admitting: Orthopedic Surgery

## 2023-07-14 ENCOUNTER — Encounter: Payer: Self-pay | Admitting: Internal Medicine

## 2023-07-14 ENCOUNTER — Other Ambulatory Visit (HOSPITAL_COMMUNITY): Payer: Self-pay

## 2023-07-14 ENCOUNTER — Ambulatory Visit: Payer: Medicare Other

## 2023-07-14 DIAGNOSIS — Z85828 Personal history of other malignant neoplasm of skin: Secondary | ICD-10-CM | POA: Diagnosis not present

## 2023-07-14 DIAGNOSIS — Z79899 Other long term (current) drug therapy: Secondary | ICD-10-CM | POA: Diagnosis not present

## 2023-07-14 DIAGNOSIS — M1612 Unilateral primary osteoarthritis, left hip: Secondary | ICD-10-CM | POA: Diagnosis not present

## 2023-07-14 DIAGNOSIS — I10 Essential (primary) hypertension: Secondary | ICD-10-CM | POA: Diagnosis not present

## 2023-07-14 DIAGNOSIS — I4891 Unspecified atrial fibrillation: Secondary | ICD-10-CM | POA: Diagnosis not present

## 2023-07-14 DIAGNOSIS — E119 Type 2 diabetes mellitus without complications: Secondary | ICD-10-CM | POA: Diagnosis not present

## 2023-07-14 LAB — GLUCOSE, CAPILLARY
Glucose-Capillary: 172 mg/dL — ABNORMAL HIGH (ref 70–99)
Glucose-Capillary: 256 mg/dL — ABNORMAL HIGH (ref 70–99)

## 2023-07-14 LAB — CBC
HCT: 31.9 % — ABNORMAL LOW (ref 36.0–46.0)
Hemoglobin: 10.2 g/dL — ABNORMAL LOW (ref 12.0–15.0)
MCH: 30.6 pg (ref 26.0–34.0)
MCHC: 32 g/dL (ref 30.0–36.0)
MCV: 95.8 fL (ref 80.0–100.0)
Platelets: 201 10*3/uL (ref 150–400)
RBC: 3.33 MIL/uL — ABNORMAL LOW (ref 3.87–5.11)
RDW: 12.7 % (ref 11.5–15.5)
WBC: 11 10*3/uL — ABNORMAL HIGH (ref 4.0–10.5)
nRBC: 0 % (ref 0.0–0.2)

## 2023-07-14 LAB — BASIC METABOLIC PANEL
Anion gap: 10 (ref 5–15)
BUN: 13 mg/dL (ref 8–23)
CO2: 22 mmol/L (ref 22–32)
Calcium: 7.9 mg/dL — ABNORMAL LOW (ref 8.9–10.3)
Chloride: 100 mmol/L (ref 98–111)
Creatinine, Ser: 0.94 mg/dL (ref 0.44–1.00)
GFR, Estimated: 59 mL/min — ABNORMAL LOW (ref >60)
Glucose, Bld: 180 mg/dL — ABNORMAL HIGH (ref 70–99)
Potassium: 3.8 mmol/L (ref 3.5–5.1)
Sodium: 132 mmol/L — ABNORMAL LOW (ref 135–145)

## 2023-07-14 MED ORDER — DOCUSATE SODIUM 100 MG PO CAPS
100.0000 mg | ORAL_CAPSULE | Freq: Two times a day (BID) | ORAL | 0 refills | Status: AC
  Filled 2023-07-14: qty 60, 30d supply, fill #0

## 2023-07-14 MED ORDER — ONDANSETRON HCL 4 MG PO TABS
4.0000 mg | ORAL_TABLET | Freq: Three times a day (TID) | ORAL | 0 refills | Status: DC | PRN
  Filled 2023-07-14: qty 30, 10d supply, fill #0

## 2023-07-14 MED ORDER — POLYETHYLENE GLYCOL 3350 17 GM/SCOOP PO POWD
17.0000 g | Freq: Every day | ORAL | 0 refills | Status: AC | PRN
  Filled 2023-07-14: qty 238, 14d supply, fill #0

## 2023-07-14 MED ORDER — HYDROCODONE-ACETAMINOPHEN 5-325 MG PO TABS
1.0000 | ORAL_TABLET | ORAL | 0 refills | Status: AC | PRN
  Filled 2023-07-14: qty 42, 7d supply, fill #0

## 2023-07-14 MED ORDER — SENNA 8.6 MG PO TABS
2.0000 | ORAL_TABLET | Freq: Every day | ORAL | 0 refills | Status: AC
Start: 2023-07-14 — End: 2023-07-29
  Filled 2023-07-14: qty 30, 15d supply, fill #0

## 2023-07-14 NOTE — Progress Notes (Signed)
Physical Therapy Treatment Patient Details Name: Danielle [name] MRN: [id number] DOB: 20-Jan-1935 Today's Date: 07/14/2023   History of Present Illness Pt s/p L THR and with hx of DM with peripheral neuropathy, and a-fib    PT Comments  POD # 1 pm session Pt OOB in recliner with Daughter in room.  General transfer comment: had Daughter "hands on" assist pt using safety belt.  Also had Daughter asisst with a toilet transfer.  Educated on Freight forwarder.  General Gait Details: had Daughter "hands on" asisst pt with use of safety belt.  Slightly unsteady esp with turns.  Instructed Daughter to have "hands on" all times even at home.  General stair comments: Had Daughter "hands on" asisst pt up/down 2 steps with walker due to NO rails.  Repeated twice to ensure carry over. Then returned to room to perform some TE's following HEP handout.  Instructed on proper tech, freq as well as use of ICE.   Addressed all mobility questions, discussed appropriate activity, educated on use of ICE.  Pt ready for D/C to home.    If plan is discharge home, recommend the following: A little help with walking and/or transfers;A little help with bathing/dressing/bathroom;Assistance with cooking/housework;Assist for transportation;Help with stairs or ramp for entrance   Can travel by private vehicle        Equipment Recommendations  None recommended by PT    Recommendations for Other Services       Precautions / Restrictions Precautions Precautions: Fall Restrictions Weight Bearing Restrictions: No LLE Weight Bearing: Weight bearing as tolerated     Mobility  Bed Mobility               General bed mobility comments: Pt OOB in recliner    Transfers Overall transfer level: Needs assistance Equipment used: Rolling walker (2 wheels) Transfers: Sit to/from Stand Sit to Stand: Supervision, Contact guard assist           General transfer comment: had Daughter "hands on" assist pt using safety belt.   Also had Daughter asisst with a toilet transfer.  Educated on Freight forwarder.    Ambulation/Gait Ambulation/Gait assistance: Supervision, Contact guard assist Gait Distance (Feet): 32 Feet Assistive device: Rolling walker (2 wheels)   Gait velocity: decr     General Gait Details: had Daughter "hands on" asisst pt with use of safety belt.  Slightly unsteady esp with turns.  Instructed Daughter to have "hands on" all times even at home.   Stairs Stairs: Yes Stairs assistance: Min assist Stair Management: No rails, Step to pattern, Forwards, With walker Number of Stairs: 2 General stair comments: Had Daughter "hands on" asisst pt up/down 2 steps with walker due to NO rails.  Repeated twice to ensure carry over.   Wheelchair Mobility     Tilt Bed    Modified Rankin (Stroke Patients Only)       Balance                                            Cognition Arousal: Alert   Overall Cognitive Status: Within Functional Limits for tasks assessed                                 General Comments: AxO x 3 pleasant        Exercises  General Comments        Pertinent Vitals/Pain Pain Assessment Pain Assessment: Faces Faces Pain Scale: Hurts little more Pain Location: L hip Pain Descriptors / Indicators: Aching, Sore, Operative site guarding Pain Intervention(s): Monitored during session, Premedicated before session, Repositioned, Ice applied    Home Living                          Prior Function            PT Goals (current goals can now be found in the care plan section) Progress towards PT goals: Progressing toward goals    Frequency    7X/week      PT Plan      Co-evaluation              AM-PAC PT "6 Clicks" Mobility   Outcome Measure  Help needed turning from your back to your side while in a flat bed without using bedrails?: None Help needed moving from lying on your back to sitting on the  side of a flat bed without using bedrails?: None Help needed moving to and from a bed to a chair (including a wheelchair)?: None Help needed standing up from a chair using your arms (e.g., wheelchair or bedside chair)?: None Help needed to walk in hospital room?: A Little Help needed climbing 3-5 steps with a railing? : A Little 6 Click Score: 22    End of Session Equipment Utilized During Treatment: Gait belt Activity Tolerance: Patient tolerated treatment well Patient left: in chair;with call bell/phone within reach;with chair alarm set;with family/visitor present Nurse Communication: Mobility status PT Visit Diagnosis: Unsteadiness on feet (R26.81);Difficulty in walking, not elsewhere classified (R26.2)     Time: 1321-1405 PT Time Calculation (min) (ACUTE ONLY): 44 min  Charges:    $Gait Training: 8-22 mins $Therapeutic Exercise: 8-22 mins $Therapeutic Activity: 8-22 mins PT General Charges $$ ACUTE PT VISIT: 1 Visit                     {Natlie Asfour  PTA Acute  Rehabilitation Services Office M-F          [phone]

## 2023-07-14 NOTE — Discharge Summary (Signed)
Physician Discharge Summary  Patient ID: Danielle [name] MRN: [id number] DOB/AGE: [id number] y.o.  Admit date: 07/13/2023 Discharge date: 07/14/2023  Admission Diagnoses:  Primary osteoarthritis of left hip  Discharge Diagnoses:  Principal Problem:   Primary osteoarthritis of left hip Active Problems:   S/P total left hip arthroplasty   Past Medical History:  Diagnosis Date   Anemia    Anxiety    Arrhythmia    Arthritis    Atrial fibrillation (HCC)    Cataract    bilateral   Colon polyps    Diabetes mellitus without complication (HCC)    type two   Dyspnea    GERD (gastroesophageal reflux disease)    Hypercholesteremia    Hypertension    Iron deficiency anemia    Pancreatitis    Peripheral neuropathy    Refusal of blood transfusions as patient is Jehovah's Witness    Squamous cell skin cancer     Surgeries: Procedure(s): TOTAL HIP ARTHROPLASTY ANTERIOR APPROACH on 07/13/2023   Consultants (if any):   Discharged Condition: Improved  Hospital Course: JAZIEL GASPARIAN is an 88 y.o. female who was admitted 07/13/2023 with a diagnosis of Primary osteoarthritis of left hip and went to the operating room on 07/13/2023 and underwent the above named procedures.    She was given perioperative antibiotics:  Anti-infectives (From admission, onward)    Start     Dose/Rate Route Frequency Ordered Stop   07/13/23 1730  ceFAZolin (ANCEF) IVPB 2g/100 mL premix        2 g 200 mL/hr over 30 Minutes Intravenous Every 6 hours 07/13/23 1544 07/14/23 0121   07/13/23 1008  ceFAZolin (ANCEF) 2-4 GM/100ML-% IVPB       Note to Pharmacy: [facility]: cabinet override      07/13/23 1008 07/13/23 1131   07/13/23 1000  ceFAZolin (ANCEF) IVPB 2g/100 mL premix        2 g 200 mL/hr over 30 Minutes Intravenous On call to O.R. 07/13/23 0952 07/13/23 1200       She was given sequential compression devices, early ambulation, and xarelto for DVT prophylaxis.  POD#1 Pain well  controlled. She ambulated 68 and 32 feet with PT. Discharged home with HEP.   She benefited maximally from the hospital stay and there were no complications.    Recent vital signs:  Vitals:   07/14/23 0541 07/14/23 1009  BP: (!) 158/91 (!) 130/97  Pulse: 79   Resp: 17 17  Temp: 97.9 F (36.6 C) 98.1 F (36.7 C)  SpO2: 100% 98%    Recent laboratory studies:  Lab Results  Component Value Date   HGB 10.2 (L) 07/14/2023   HGB 12.2 06/30/2023   HGB 13.2 05/02/2023   Lab Results  Component Value Date   WBC 11.0 (H) 07/14/2023   PLT 201 07/14/2023   No results found for: "INR" Lab Results  Component Value Date   NA 132 (L) 07/14/2023   K 3.8 07/14/2023   CL 100 07/14/2023   CO2 22 07/14/2023   BUN 13 07/14/2023   CREATININE 0.94 07/14/2023   GLUCOSE 180 (H) 07/14/2023     Allergies as of 07/14/2023       Reactions   Amoxicillin Swelling, Rash   Throat swells Tolerated Cephalosporin Date: 07/13/2023.   Hydrochlorothiazide Other (See Comments)   HA and Leg cramps   Shellfish Allergy    Shrimp (diagnostic) Other (See Comments)   Gout flares   Cleocin [clindamycin Hcl] Rash  Codeine Rash   And edema   Penicillins Rash   Tolerated Cephalosporin Date: 07/13/2023.        Medication List     TAKE these medications    Accu-Chek Guide test strip Generic drug: glucose blood Use as instructed   Accu-Chek Guide w/Device Kit DxE11.65 Check BG tid   Accu-Chek Softclix Lancets lancets Use as instructed   atorvastatin 10 MG tablet Commonly known as: LIPITOR Take 1 tablet by mouth once daily   B-D INS SYR ULTRAFINE .5CC/30G 30G X 1/2" 0.5 ML Misc Generic drug: Insulin Syringe-Needle U-100 1 each by Does not apply route daily.   calcium carbonate 1500 (600 Ca) MG Tabs tablet Commonly known as: OSCAL Take 600 mg of elemental calcium by mouth 2 (two) times daily with a meal.   colchicine 0.6 MG tablet Take 1 tablet by mouth once daily What changed:  when  to take this reasons to take this   cyanocobalamin 1000 MCG/ML injection Commonly known as: VITAMIN B12 Inject 1,000 mcg into the muscle every 30 (thirty) days.   diltiazem 120 MG 24 hr capsule Commonly known as: DILACOR XR Take 1 capsule (120 mg total) by mouth daily.   docusate sodium 100 MG capsule Commonly known as: Colace Take 1 capsule (100 mg total) by mouth 2 (two) times daily.   gabapentin 300 MG capsule Commonly known as: NEURONTIN TAKE 3 CAPSULES BY MOUTH AT BEDTIME What changed: how much to take   HYDROcodone-acetaminophen 5-325 MG tablet Commonly known as: NORCO/VICODIN Take 1 tablet by mouth every 4 (four) hours as needed for up to 7 days for moderate pain or severe pain.   Lantus 100 UNIT/ML injection Generic drug: insulin glargine INJECT 24 TO 26 UNITS SUBCUTANEOUSLY AT BEDTIME   metFORMIN 1000 MG tablet Commonly known as: GLUCOPHAGE Take 1 tablet (1,000 mg total) by mouth 2 (two) times daily.   metoprolol succinate 50 MG 24 hr tablet Commonly known as: TOPROL-XL TAKE 1 TABLET BY MOUTH TWICE DAILY (MORNING  AT  AT  BEDTIME)  WITH  OR  IMMEDIATELY  FOLLOWING  A  MEAL   omeprazole 40 MG capsule Commonly known as: PRILOSEC Take 1 capsule (40 mg total) by mouth daily. What changed: when to take this   ondansetron 4 MG tablet Commonly known as: Zofran Take 1 tablet (4 mg total) by mouth every 8 (eight) hours as needed for nausea or vomiting.   Pen Needles 31G X 8 MM Misc 1 each by Does not apply route 2 (two) times daily.   polyethylene glycol powder 17 GM/SCOOP powder Commonly known as: MiraLax Dissolve 17 g in liquid and take by mouth daily as needed for mild constipation or moderate constipation.   senna 8.6 MG Tabs tablet Commonly known as: SENOKOT Take 2 tablets (17.2 mg total) by mouth at bedtime for 15 days.   Systane 0.4-0.3 % Soln Generic drug: Polyethyl Glycol-Propyl Glycol Place 1-2 drops into both eyes 3 (three) times daily as needed  (dry/irritated eyes).   Vitamin D3 25 MCG (1000 UT) Caps Take 1,000 Units by mouth in the morning and at bedtime.   Xarelto 20 MG Tabs tablet Generic drug: rivaroxaban TAKE 1 TABLET BY MOUTH ONCE DAILY WITH SUPPER               Discharge Care Instructions  (From admission, onward)           Start     Ordered   07/14/23 0000  Weight bearing as tolerated  07/14/23 0842   07/14/23 0000  Change dressing       Comments: Do not change your dressing.   07/14/23 0842              WEIGHT BEARING   Weight bearing as tolerated with assist device (walker, cane, etc) as directed, use it as long as suggested by your surgeon or therapist, typically at least 4-6 weeks.   EXERCISES  Results after joint replacement surgery are often greatly improved when you follow the exercise, range of motion and muscle strengthening exercises prescribed by your doctor. Safety measures are also important to protect the joint from further injury. Any time any of these exercises cause you to have increased pain or swelling, decrease what you are doing until you are comfortable again and then slowly increase them. If you have problems or questions, call your caregiver or physical therapist for advice.   Rehabilitation is important following a joint replacement. After just a few days of immobilization, the muscles of the leg can become weakened and shrink (atrophy).  These exercises are designed to build up the tone and strength of the thigh and leg muscles and to improve motion. Often times heat used for twenty to thirty minutes before working out will loosen up your tissues and help with improving the range of motion but do not use heat for the first two weeks following surgery (sometimes heat can increase post-operative swelling).   These exercises can be done on a training (exercise) mat, on the floor, on a table or on a bed. Use whatever works the best and is most comfortable for you.    Use  music or television while you are exercising so that the exercises are a pleasant break in your day. This will make your life better with the exercises acting as a break in your routine that you can look forward to.   Perform all exercises about fifteen times, three times per day or as directed.  You should exercise both the operative leg and the other leg as well.  Exercises include:   Quad Sets - Tighten up the muscle on the front of the thigh (Quad) and hold for 5-10 seconds.   Straight Leg Raises - With your knee straight (if you were given a brace, keep it on), lift the leg to 60 degrees, hold for 3 seconds, and slowly lower the leg.  Perform this exercise against resistance later as your leg gets stronger.  Leg Slides: Lying on your back, slowly slide your foot toward your buttocks, bending your knee up off the floor (only go as far as is comfortable). Then slowly slide your foot back down until your leg is flat on the floor again.  Angel Wings: Lying on your back spread your legs to the side as far apart as you can without causing discomfort.  Hamstring Strength:  Lying on your back, push your heel against the floor with your leg straight by tightening up the muscles of your buttocks.  Repeat, but this time bend your knee to a comfortable angle, and push your heel against the floor.  You may put a pillow under the heel to make it more comfortable if necessary.   A rehabilitation program following joint replacement surgery can speed recovery and prevent re-injury in the future due to weakened muscles. Contact your doctor or a physical therapist for more information on knee rehabilitation.    CONSTIPATION  Constipation is defined medically as fewer than three stools per  week and severe constipation as less than one stool per week.  Even if you have a regular bowel pattern at home, your normal regimen is likely to be disrupted due to multiple reasons following surgery.  Combination of anesthesia,  postoperative narcotics, change in appetite and fluid intake all can affect your bowels.   YOU MUST use at least one of the following options; they are listed in order of increasing strength to get the job done.  They are all available over the counter, and you may need to use some, POSSIBLY even all of these options:    Drink plenty of fluids (prune juice may be helpful) and high fiber foods Colace 100 mg by mouth twice a day  Senokot for constipation as directed and as needed Dulcolax (bisacodyl), take with full glass of water  Miralax (polyethylene glycol) once or twice a day as needed.  If you have tried all these things and are unable to have a bowel movement in the first 3-4 days after surgery call either your surgeon or your primary doctor.    If you experience loose stools or diarrhea, hold the medications until you stool forms back up.  If your symptoms do not get better within 1 week or if they get worse, check with your doctor.  If you experience "the worst abdominal pain ever" or develop nausea or vomiting, please contact the office immediately for further recommendations for treatment.   ITCHING:  If you experience itching with your medications, try taking only a single pain pill, or even half a pain pill at a time.  You can also use Benadryl over the counter for itching or also to help with sleep.   TED HOSE STOCKINGS:  Use stockings on both legs until for at least 2 weeks or as directed by physician office. They may be removed at night for sleeping.  MEDICATIONS:  See your medication summary on the "After Visit Summary" that nursing will review with you.  You may have some home medications which will be placed on hold until you complete the course of blood thinner medication.  It is important for you to complete the blood thinner medication as prescribed.  PRECAUTIONS:  If you experience chest pain or shortness of breath - call 911 immediately for transfer to the hospital emergency  department.   If you develop a fever greater that 101 F, purulent drainage from wound, increased redness or drainage from wound, foul odor from the wound/dressing, or calf pain - CONTACT YOUR SURGEON.                                                   FOLLOW-UP APPOINTMENTS:  If you do not already have a post-op appointment, please call the office for an appointment to be seen by your surgeon.  Guidelines for how soon to be seen are listed in your "After Visit Summary", but are typically between 1-4 weeks after surgery.  OTHER INSTRUCTIONS:   Knee Replacement:  Do not place pillow under knee, focus on keeping the knee straight while resting. CPM instructions: 0-90 degrees, 2 hours in the morning, 2 hours in the afternoon, and 2 hours in the evening. Place foam block, curve side up under heel at all times except when in CPM or when walking.  DO NOT modify, tear, cut, or change the foam  block in any way.   MAKE SURE YOU:  Understand these instructions.  Get help right away if you are not doing well or get worse.    Thank you for letting us be a part of your medical care team.  It is a privilege we respect greatly.  We hope these instructions will help you stay on track for a fast and full recovery!   Diagnostic Studies: DG Pelvis Portable  Result Date: 07/13/2023 CLINICAL DATA:  Status post total left hip arthroplasty. EXAM: PORTABLE PELVIS 1-2 VIEWS COMPARISON:  Pelvis and left hip radiographs 06/03/2021 FINDINGS: Interval total left hip arthroplasty. No perihardware lucency is seen to indicate hardware failure or loosening. Expected postoperative changes including lateral left hip subcutaneous air. Mild severe pubic symphysis degenerative spurring. The bilateral sacroiliac joint spaces are maintained. No significant right femoroacetabular joint space narrowing. Moderate right L3-4 and mild right L4-5 disc space narrowing and endplate osteophytes. No acute fracture or dislocation. IMPRESSION:  Interval total left hip arthroplasty without evidence of hardware failure or loosening. Electronically Signed   By: Neita Garnet M.D.   On: 07/13/2023 15:08   DG HIP UNILAT WITH PELVIS 1V LEFT  Result Date: 07/13/2023 CLINICAL DATA:  Elective surgery.  Anterior left hip arthroplasty. EXAM: DG HIP (WITH OR WITHOUT PELVIS) 1V*L* COMPARISON:  Pelvis and left hip radiographs 06/03/2021 FINDINGS: Images were performed intraoperatively without the presence of a radiologist. Moderate superior left femoroacetabular joint space narrowing. The patient is undergoing total left hip arthroplasty. No hardware complication is seen. Total fluoroscopy images: 9 Total fluoroscopy time: 16 seconds Total dose: Radiation Exposure Index (as provided by the fluoroscopic device): 1.89 mGy air Kerma Please see intraoperative findings for further detail. IMPRESSION: Intraoperative fluoroscopic guidance for total left hip arthroplasty. Electronically Signed   By: Neita Garnet M.D.   On: 07/13/2023 15:07   DG C-Arm 1-60 Min-No Report  Result Date: 07/13/2023 Fluoroscopy was utilized by the requesting physician.  No radiographic interpretation.   DG C-Arm 1-60 Min-No Report  Result Date: 07/13/2023 Fluoroscopy was utilized by the requesting physician.  No radiographic interpretation.    Disposition: Discharge disposition: 01-Home or Self Care       Discharge Instructions     Call MD / Call 911   Complete by: As directed    If you experience chest pain or shortness of breath, CALL 911 and be transported to the hospital emergency room.  If you develope a fever above 101 F, pus (white drainage) or increased drainage or redness at the wound, or calf pain, call your surgeon's office.   Change dressing   Complete by: As directed    Do not change your dressing.   Constipation Prevention   Complete by: As directed    Drink plenty of fluids.  Prune juice may be helpful.  You may use a stool softener, such as Colace (over  the counter) 100 mg twice a day.  Use MiraLax (over the counter) for constipation as needed.   Diet - low sodium heart healthy   Complete by: As directed    Discharge instructions   Complete by: As directed    Elevate toes above nose. Use cryotherapy as needed for pain and swelling.   Driving restrictions   Complete by: As directed    No driving for 6 weeks   Follow the hip precautions as taught in Physical Therapy   Complete by: As directed    Increase activity slowly as tolerated   Complete by:  As directed    Lifting restrictions   Complete by: As directed    No lifting for 6 weeks   Post-operative opioid taper instructions:   Complete by: As directed    POST-OPERATIVE OPIOID TAPER INSTRUCTIONS: It is important to wean off of your opioid medication as soon as possible. If you do not need pain medication after your surgery it is ok to stop day one. Opioids include: Codeine, Hydrocodone(Norco, Vicodin), Oxycodone(Percocet, oxycontin) and hydromorphone amongst others.  Long term and even short term use of opiods can cause: Increased pain response Dependence Constipation Depression Respiratory depression And more.  Withdrawal symptoms can include Flu like symptoms Nausea, vomiting And more Techniques to manage these symptoms Hydrate well Eat regular healthy meals Stay active Use relaxation techniques(deep breathing, meditating, yoga) Do Not substitute Alcohol to help with tapering If you have been on opioids for less than two weeks and do not have pain than it is ok to stop all together.  Plan to wean off of opioids This plan should start within one week post op of your joint replacement. Maintain the same interval or time between taking each dose and first decrease the dose.  Cut the total daily intake of opioids by one tablet each day Next start to increase the time between doses. The last dose that should be eliminated is the evening dose.      Weight bearing as  tolerated   Complete by: As directed         Follow-up Information     Clois Dupes, PA-C. Go on 07/28/2023.   Specialty: Orthopedic Surgery Why: You are scheduled for first post op appt on Friday October 4 at 11:00am. Contact information: [address] [place] [address] [phone]                  Signed: Clois Dupes 07/14/2023, 5:42 PM

## 2023-07-14 NOTE — Inpatient Diabetes Management (Signed)
Inpatient Diabetes Program Recommendations  AACE/ADA: New Consensus Statement on Inpatient Glycemic Control (2015)  Target Ranges:  Prepandial:   less than 140 mg/dL      Peak postprandial:   less than 180 mg/dL (1-2 hours)      Critically ill patients:  140 - 180 mg/dL   Lab Results  Component Value Date   GLUCAP 256 (H) 07/14/2023   HGBA1C 7.3 (H) 06/30/2023    Review of Glycemic Control  Diabetes history: DM2 Outpatient Diabetes medications: Lantus 24-26 units at bedtime, metformin 1000 mg BID Current orders for Inpatient glycemic control: Novolog 0-9 units TID with meals and 0-5 HS  HgbA1C - 7.3%  Inpatient Diabetes Program Recommendations:    Spoke with pt and daughters regarding her HgbA1C of 7.3% and goal of < 8%. Discussed FS CGM and pt thinks she may be interested in this. Gave info and will give sample for pt to use at discharge. Instructed to f/u with PCP regarding CGM prescription.  Thank you. Ailene Ards, RD, LDN, CDCES Inpatient Diabetes Coordinator [phone]

## 2023-07-14 NOTE — Plan of Care (Signed)
Problem: Education: Goal: Ability to describe self-care measures that may prevent or decrease complications (Diabetes Survival Skills Education) will improve Outcome: Adequate for Discharge Goal: Individualized Educational Video(s) Outcome: Adequate for Discharge   Problem: Coping: Goal: Ability to adjust to condition or change in health will improve Outcome: Adequate for Discharge   Problem: Fluid Volume: Goal: Ability to maintain a balanced intake and output will improve Outcome: Adequate for Discharge   Problem: Health Behavior/Discharge Planning: Goal: Ability to identify and utilize available resources and services will improve Outcome: Adequate for Discharge Goal: Ability to manage health-related needs will improve Outcome: Adequate for Discharge   Problem: Metabolic: Goal: Ability to maintain appropriate glucose levels will improve Outcome: Adequate for Discharge   Problem: Nutritional: Goal: Maintenance of adequate nutrition will improve Outcome: Adequate for Discharge Goal: Progress toward achieving an optimal weight will improve Outcome: Adequate for Discharge   Problem: Skin Integrity: Goal: Risk for impaired skin integrity will decrease Outcome: Adequate for Discharge   Problem: Tissue Perfusion: Goal: Adequacy of tissue perfusion will improve Outcome: Adequate for Discharge   Problem: Education: Goal: Knowledge of the prescribed therapeutic regimen will improve Outcome: Adequate for Discharge Goal: Understanding of discharge needs will improve Outcome: Adequate for Discharge Goal: Individualized Educational Video(s) Outcome: Adequate for Discharge   Problem: Activity: Goal: Ability to avoid complications of mobility impairment will improve Outcome: Adequate for Discharge Goal: Ability to tolerate increased activity will improve Outcome: Adequate for Discharge   Problem: Clinical Measurements: Goal: Postoperative complications will be avoided or  minimized Outcome: Adequate for Discharge   Problem: Pain Management: Goal: Pain level will decrease with appropriate interventions Outcome: Adequate for Discharge   Problem: Skin Integrity: Goal: Will show signs of wound healing Outcome: Adequate for Discharge   Problem: Education: Goal: Knowledge of General Education information will improve Description: Including pain rating scale, medication(s)/side effects and non-pharmacologic comfort measures Outcome: Adequate for Discharge   Problem: Health Behavior/Discharge Planning: Goal: Ability to manage health-related needs will improve Outcome: Adequate for Discharge   Problem: Clinical Measurements: Goal: Ability to maintain clinical measurements within normal limits will improve Outcome: Adequate for Discharge Goal: Will remain free from infection Outcome: Adequate for Discharge Goal: Diagnostic test results will improve Outcome: Adequate for Discharge Goal: Respiratory complications will improve Outcome: Adequate for Discharge Goal: Cardiovascular complication will be avoided Outcome: Adequate for Discharge   Problem: Activity: Goal: Risk for activity intolerance will decrease Outcome: Adequate for Discharge   Problem: Nutrition: Goal: Adequate nutrition will be maintained Outcome: Adequate for Discharge   Problem: Coping: Goal: Level of anxiety will decrease Outcome: Adequate for Discharge   Problem: Elimination: Goal: Will not experience complications related to bowel motility Outcome: Adequate for Discharge Goal: Will not experience complications related to urinary retention Outcome: Adequate for Discharge   Problem: Pain Managment: Goal: General experience of comfort will improve Outcome: Adequate for Discharge   Problem: Safety: Goal: Ability to remain free from injury will improve Outcome: Adequate for Discharge   Problem: Skin Integrity: Goal: Risk for impaired skin integrity will decrease Outcome:  Adequate for Discharge

## 2023-07-14 NOTE — TOC Transition Note (Signed)
Transition of Care [facility]) - CM/SW Discharge Note   Patient Details  Name: Danielle [name] MRN: [id number] Date of Birth: Nov 23, 1934  Transition of Care [facility]) CM/SW Contact:  Amada Jupiter, LCSW Phone Number: 07/14/2023, 10:56 AM   Clinical Narrative:     Met with pt who confirms she has needed DME in the home.  Plan for HEP.  No further TOC needs.  Final next level of care: Home/Self Care Barriers to Discharge: No Barriers Identified   Patient Goals and CMS Choice      Discharge Placement                         Discharge Plan and Services Additional resources added to the After Visit Summary for                  DME Arranged: N/A                    Social Determinants of Health (SDOH) Interventions SDOH Screenings   Food Insecurity: No Food Insecurity (07/13/2023)  Housing: Low Risk  (07/13/2023)  Transportation Needs: No Transportation Needs (07/13/2023)  Utilities: Not At Risk (07/13/2023)  Alcohol Screen: Low Risk  (09/08/2022)  Depression (PHQ2-9): Low Risk  (05/02/2023)  Financial Resource Strain: Low Risk  (09/08/2022)  Physical Activity: Inactive (09/08/2022)  Social Connections: Moderately Integrated (09/08/2022)  Stress: No Stress Concern Present (09/08/2022)  Tobacco Use: Low Risk  (07/13/2023)     Readmission Risk Interventions     No data to display

## 2023-07-14 NOTE — Evaluation (Signed)
Physical Therapy Evaluation Patient Details Name: Danielle [name] MRN: [id number] DOB: 02-19-1935 Today's Date: 07/14/2023  History of Present Illness  Pt s/p L THR and with hx of DM with peripheral neuropathy, and a-fib  Clinical Impression  Pt s/p L THR and presents with decreased L LE strength/ROM, ambulatory balance deficitsand post op pain limiting functional mobility.  Pt should progress to dc home with 24/7 assist of family.      If plan is discharge home, recommend the following: A little help with walking and/or transfers;A little help with bathing/dressing/bathroom;Assistance with cooking/housework;Assist for transportation;Help with stairs or ramp for entrance   Can travel by private vehicle        Equipment Recommendations None recommended by PT  Recommendations for Other Services       Functional Status Assessment Patient has had a recent decline in their functional status and demonstrates the ability to make significant improvements in function in a reasonable and predictable amount of time.     Precautions / Restrictions Precautions Precautions: Fall Restrictions Weight Bearing Restrictions: No LLE Weight Bearing: Weight bearing as tolerated      Mobility  Bed Mobility Overal bed mobility: Needs Assistance Bed Mobility: Supine to Sit     Supine to sit: Min assist     General bed mobility comments: cues for sequence and use of R LE to self assist    Transfers Overall transfer level: Needs assistance Equipment used: Rolling walker (2 wheels) Transfers: Sit to/from Stand Sit to Stand: Min assist           General transfer comment: cues for LE management, use of UEs to self assist and physical assist to bring wt up and fwd to balance in initial standing with RW    Ambulation/Gait Ambulation/Gait assistance: Min assist Gait Distance (Feet): 68 Feet Assistive device: Rolling walker (2 wheels) Gait Pattern/deviations: Step-to pattern, Decreased step  length - right, Decreased step length - left, Shuffle, Trunk flexed Gait velocity: decr     General Gait Details: cues for posture, position from RW and initial sequence.  Physical assist for balance and RW management  Stairs            Wheelchair Mobility     Tilt Bed    Modified Rankin (Stroke Patients Only)       Balance Overall balance assessment: Needs assistance Sitting-balance support: No upper extremity supported, Feet supported Sitting balance-Leahy Scale: Good     Standing balance support: Bilateral upper extremity supported Standing balance-Leahy Scale: Poor                               Pertinent Vitals/Pain Pain Assessment Pain Assessment: 0-10 Pain Score: 5  Pain Location: L hip Pain Descriptors / Indicators: Aching, Sore Pain Intervention(s): Limited activity within patient's tolerance, Monitored during session, Premedicated before session, Ice applied    Home Living Family/patient expects to be discharged to:: Private residence Living Arrangements: Alone Available Help at Discharge: Family;Available 24 hours/day (assist of dtrs and niece) Type of Home: House Home Access: Stairs to enter Entrance Stairs-Rails: None Entrance Stairs-Number of Steps: 2 Alternate Level Stairs-Number of Steps: 2 Home Layout: Two level Home Equipment: Agricultural consultant (2 wheels);Cane - single point      Prior Function Prior Level of Function : Independent/Modified Independent                     Extremity/Trunk Assessment  Upper Extremity Assessment Upper Extremity Assessment: Overall WFL for tasks assessed    Lower Extremity Assessment Lower Extremity Assessment: LLE deficits/detail LLE Deficits / Details: AAROM at hip to 80 flex and 15 abd; 2/5 strength at hip    Cervical / Trunk Assessment Cervical / Trunk Assessment: Kyphotic  Communication   Communication Communication: Hearing impairment Cueing Techniques: Verbal cues;Gestural  cues;Tactile cues;Visual cues  Cognition Arousal: Alert Behavior During Therapy: WFL for tasks assessed/performed Overall Cognitive Status: Within Functional Limits for tasks assessed                                          General Comments      Exercises Total Joint Exercises Ankle Circles/Pumps: AROM, Both, 15 reps, Supine Quad Sets: AROM, Both, 10 reps, Supine Heel Slides: AAROM, Left, 20 reps, Supine Hip ABduction/ADduction: AAROM, Left, 15 reps, Supine   Assessment/Plan    PT Assessment Patient needs continued PT services  PT Problem List Decreased strength;Decreased range of motion;Decreased activity tolerance;Decreased balance;Decreased mobility;Pain;Decreased knowledge of use of DME       PT Treatment Interventions DME instruction;Gait training;Stair training;Functional mobility training;Therapeutic activities;Therapeutic exercise;Patient/family education    PT Goals (Current goals can be found in the Care Plan section)  Acute Rehab PT Goals Patient Stated Goal: Regain IND PT Goal Formulation: With patient Time For Goal Achievement: 07/21/23 Potential to Achieve Goals: Good    Frequency 7X/week     Co-evaluation               AM-PAC PT "6 Clicks" Mobility  Outcome Measure Help needed turning from your back to your side while in a flat bed without using bedrails?: A Little Help needed moving from lying on your back to sitting on the side of a flat bed without using bedrails?: A Little Help needed moving to and from a bed to a chair (including a wheelchair)?: A Little Help needed standing up from a chair using your arms (e.g., wheelchair or bedside chair)?: A Little Help needed to walk in hospital room?: A Little Help needed climbing 3-5 steps with a railing? : A Lot 6 Click Score: 17    End of Session Equipment Utilized During Treatment: Gait belt Activity Tolerance: Patient tolerated treatment well;Patient limited by fatigue Patient  left: in chair;with call bell/phone within reach;with chair alarm set Nurse Communication: Mobility status PT Visit Diagnosis: Unsteadiness on feet (R26.81);Difficulty in walking, not elsewhere classified (R26.2)    Time: 6295-2841 PT Time Calculation (min) (ACUTE ONLY): 30 min   Charges:   PT Evaluation $PT Eval Low Complexity: 1 Low PT Treatments $Therapeutic Exercise: 8-22 mins PT General Charges $$ ACUTE PT VISIT: 1 Visit         Mauro Kaufmann PT Acute Rehabilitation Services Pager [phone] Office [phone]   Danielle [name] 07/14/2023, 12:04 PM

## 2023-07-14 NOTE — Care Management Obs Status (Signed)
MEDICARE OBSERVATION STATUS NOTIFICATION   Patient Details  Name: SHANISHA YANO MRN: [id number] Date of Birth: 23-Oct-1935   Medicare Observation Status Notification Given:  Hart Robinsons, LCSW 07/14/2023, 10:43 AM

## 2023-07-14 NOTE — Progress Notes (Signed)
    Subjective:  Patient reports pain as mild.  Denies N/V/CP/SOB/Abd pain. She reports that her pain is controlled with the medication. No acute events overnight.   Objective:   VITALS:   Vitals:   07/13/23 2057 07/13/23 2150 07/14/23 0127 07/14/23 0541  BP:  134/80 (!) 174/74 (!) 158/91  Pulse: 64 (!) 51 (!) 58 79  Resp:  16 17 17   Temp:  (!) 97.5 F (36.4 C) (!) 97.5 F (36.4 C) 97.9 F (36.6 C)  TempSrc:  Oral Oral   SpO2:  97% 100% 100%  Weight:      Height:        Patient is sitting up in bed. NAD.  Neurologically intact ABD soft Neurovascular intact Sensation intact distally Intact pulses distally Dorsiflexion/Plantar flexion intact Incision: dressing C/D/I No cellulitis present Compartment soft   Lab Results  Component Value Date   WBC 11.0 (H) 07/14/2023   HGB 10.2 (L) 07/14/2023   HCT 31.9 (L) 07/14/2023   MCV 95.8 07/14/2023   PLT 201 07/14/2023   BMET    Component Value Date/Time   NA 132 (L) 07/14/2023 0347   NA 139 02/26/2021 1100   K 3.8 07/14/2023 0347   CL 100 07/14/2023 0347   CO2 22 07/14/2023 0347   GLUCOSE 180 (H) 07/14/2023 0347   BUN 13 07/14/2023 0347   BUN 12 02/26/2021 1100   CREATININE 0.94 07/14/2023 0347   CREATININE 0.74 05/02/2023 1549   CALCIUM 7.9 (L) 07/14/2023 0347   EGFR 79 05/02/2023 1549   EGFR 71 02/26/2021 1100   GFRNONAA 59 (L) 07/14/2023 0347   GFRNONAA >60 04/27/2021 1112     Assessment/Plan: 1 Day Post-Op   Principal Problem:   Primary osteoarthritis of left hip Active Problems:   S/P total left hip arthroplasty  ABLA. Hemoglobin 10.2. Continue to monitor.   WBAT with walker DVT ppx: Xarelto, SCDs, TEDS PO pain control PT/OT: PT has not seen yet. PT to come by today.  Dispo:  - D/c home once cleared with PT.    Clois Dupes, PA-C 07/14/2023, 8:36 AM   [facility]  [place] [address], [place], [place] [address] Phone:  [phone] [email or web address] Facebook  [employer]

## 2023-07-24 ENCOUNTER — Other Ambulatory Visit: Payer: Self-pay | Admitting: Cardiovascular Disease

## 2023-07-28 DIAGNOSIS — Z471 Aftercare following joint replacement surgery: Secondary | ICD-10-CM | POA: Diagnosis not present

## 2023-07-28 DIAGNOSIS — Z96642 Presence of left artificial hip joint: Secondary | ICD-10-CM | POA: Diagnosis not present

## 2023-08-05 ENCOUNTER — Other Ambulatory Visit: Payer: Self-pay | Admitting: Internal Medicine

## 2023-08-05 DIAGNOSIS — E1142 Type 2 diabetes mellitus with diabetic polyneuropathy: Secondary | ICD-10-CM

## 2023-08-07 NOTE — Telephone Encounter (Signed)
Requested Prescriptions  Pending Prescriptions Disp Refills   LANTUS 100 UNIT/ML injection [Pharmacy Med Name: Lantus 100 UNIT/ML Subcutaneous Solution] 10 mL 1    Sig: INJECT 24 TO 26 UNITS SUBCUTANEOUSLY AT BEDTIME     Endocrinology:  Diabetes - Insulins Passed - 08/05/2023  1:44 PM      Passed - HBA1C is between 0 and 7.9 and within 180 days    Hgb A1c MFr Bld  Date Value Ref Range Status  06/30/2023 7.3 (H) 4.8 - 5.6 % Final    Comment:    (NOTE) Pre diabetes:          5.7%-6.4%  Diabetes:              >6.4%  Glycemic control for   <7.0% adults with diabetes          Passed - Valid encounter within last 6 months    Recent Outpatient Visits           2 months ago Right flank pain   [facility] Margarita Mail, DO   3 months ago Pre-op evaluation   [facility] Margarita Mail, DO   5 months ago Type 2 diabetes mellitus with diabetic polyneuropathy, with long-term current use of insulin [facility])   [place] [facility] Margarita Mail, DO   8 months ago Diabetic peripheral neuropathy associated with type 2 diabetes mellitus [facility])   [place] [facility] Margarita Mail, DO   11 months ago Type 2 diabetes mellitus with hyperglycemia, with long-term current use of insulin [facility])   [place] [facility] Margarita Mail, DO       Future Appointments             Tomorrow Margarita Mail, DO [facility]   In 1 month  [facility]

## 2023-08-07 NOTE — Progress Notes (Deleted)
Established Patient Office Visit  Subjective:  Patient ID: Danielle [name], female    DOB: 07-14-1935  Age: 88 y.o. MRN: [id number]  CC:  No chief complaint on file.   HPI Danielle [name] here for follow up on chronic medical conditions.    Hypertension/A.Fib: -Medications: Diltiazem 120 mg, Metoprolol 50 mg, Xarelto 20 mg -Follows with Cardiology, last seen 04/18/2023 -Patient is compliant with above medications and reports no side effects. -Denies any SOB, CP, vision changes, LE edema or symptoms of hypotension.  Does have some mild fatigue.  HLD: -Medications: Lipitor 10 mg -Patient is compliant with above medications and reports no side effects.  -Last lipid panel:  Lipid Panel     Component Value Date/Time   CHOL 108 11/30/2022 0951   TRIG 192 (H) 11/30/2022 0951   HDL 38 (L) 11/30/2022 0951   CHOLHDL 2.8 11/30/2022 0951   VLDL 45 (H) 11/10/2008 0317   LDLCALC 44 11/30/2022 0951    Diabetes, Type 2 with Neuropathy: -Last A1c 7.3% 9/24 -Medications: Metformin 1000 mg BID, Lantus 24-26 units at night, Gabapentin 300 mg TID -Patient is compliant with the above medications and reports no side effects.  -Checking BG at home: fasting 78-135 -Eye exam: UTD 9/23 -Foot exam: Follows with Podiatry, just seen on 06/01/2023 -Microalbumin: UTD 2/24 -Statin: yes -PNA vaccine: UTD -Denies symptoms of hypoglycemia, polyuria, polydipsia, numbness extremities, foot ulcers/trauma.   Iron Deficiency Anemia/Vitamin B12 deficiency: -Had been on IV iron in the past, but not on any more -Following with Hematology, following in November to repeat labs -Currently on IM Vitamin B12 injection every 4 weeks   GERD: -Currently on Prilosec 40, doing well  Sensorineural Hearing Loss in left ear due to COVID: -Following with [facility], note from 01/25/22 reviewed, plan on repeating hearing test and continuing hearing aids.  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  -Having more balance changes with worsening tinnitus bilaterally (worse on left). No ear pain/pressure. No recent viral illness, does have some post-nasal drip.  Gout: -Recently had a flare a few weeks ago in the first digit of her left foot.  She took colchicine and her symptoms resolved -Has had a total of 4 flares  Health maintenance: -Blood work UTD -Mammogram 7/24 Birads-1 - will discuss next summer if the patient would like to continue with screening  -Colonoscopy 6/22, repeat every 5 years   Past Medical History:  Diagnosis Date   Anemia    Anxiety    Arrhythmia    Arthritis    Atrial fibrillation (HCC)    Cataract    bilateral   Colon polyps    Diabetes mellitus without complication (HCC)    type two   Dyspnea    GERD (gastroesophageal reflux disease)    Hypercholesteremia    Hypertension    Iron deficiency anemia    Pancreatitis    Peripheral neuropathy    Refusal of blood transfusions as patient is Jehovah's Witness    Squamous cell skin cancer     Past Surgical History:  Procedure Laterality Date   APPENDECTOMY  1957   BREAST EXCISIONAL BIOPSY Right 1966   CATARACT EXTRACTION, BILATERAL     CHOLECYSTECTOMY  10/2008   lump removed right breast  1958   bENIGN   right ankle plate  4098  TOTAL HIP ARTHROPLASTY Left 07/13/2023   Procedure: TOTAL HIP ARTHROPLASTY ANTERIOR APPROACH;  Surgeon: Samson Frederic, MD;  Location: [facility];  Service: Orthopedics;  Laterality: Left;   VAGINAL HYSTERECTOMY  1978    Family History  Problem Relation Age of Onset   Hypertension Mother    Stroke Mother    Diabetes Mother    Colon cancer Mother 86   Cerebral aneurysm Father    Colon cancer Sister        in her 36s   CAD Sister    Heart attack Sister    Uterine cancer Sister        in her late 71s   Colon cancer Sister         in her 82s   Parkinsonism Sister    Dementia Sister    Heart attack Brother    Heart attack Brother    Throat cancer Brother    Atrial fibrillation Brother    Breast cancer Neg Hx     Social History   Socioeconomic History   Marital status: Widowed    Spouse name: Not on file   Number of children: 3   Years of education: Not on file   Highest education level: Not on file  Occupational History   Occupation: Retired  Tobacco Use   Smoking status: Never   Smokeless tobacco: Never  Vaping Use   Vaping status: Never Used  Substance and Sexual Activity   Alcohol use: No    Alcohol/week: 0.0 standard drinks of alcohol   Drug use: No   Sexual activity: Not Currently  Other Topics Concern   Not on file  Social History Narrative   Not on file   Social Determinants of Health   Financial Resource Strain: Low Risk  (09/08/2022)   Overall Financial Resource Strain (CARDIA)    Difficulty of Paying Living Expenses: Not very hard  Food Insecurity: No Food Insecurity (07/13/2023)   Hunger Vital Sign    Worried About Running Out of Food in the Last Year: Never true    Ran Out of Food in the Last Year: Never true  Transportation Needs: No Transportation Needs (07/13/2023)   PRAPARE - Administrator, Civil Service (Medical): No    Lack of Transportation (Non-Medical): No  Physical Activity: Inactive (09/08/2022)   Exercise Vital Sign    Days of Exercise per Week: 0 days    Minutes of Exercise per Session: 0 min  Stress: No Stress Concern Present (09/08/2022)   [employer] of Occupational Health - Occupational Stress Questionnaire    [name] : Only a little  Social Connections: Moderately Integrated (09/08/2022)   Social Connection and Isolation Panel [NHANES]    Frequency of Communication with Friends and Family: More than three times a week    Frequency of Social Gatherings with Friends and Family: More than three times a week    Attends  Religious Services: More than 4 times per year    Active Member of [employer] or Organizations: Yes    Attends Banker Meetings: More than 4 times per year    Marital Status: Widowed  Intimate Partner Violence: Not At Risk (07/13/2023)   Humiliation, Afraid, Rape, and Kick questionnaire    Fear of Current or Ex-Partner: No    Emotionally Abused: No    Physically Abused: No    Sexually Abused: No    ROS Review of Systems  Constitutional:  Positive for fatigue. Negative for chills  and fever.  HENT:  Positive for hearing loss. Negative for ear pain.   Eyes:  Negative for visual disturbance.  Respiratory:  Negative for cough and shortness of breath.   Cardiovascular:  Negative for chest pain and palpitations.  Gastrointestinal:  Negative for abdominal pain.  Genitourinary:  Positive for flank pain. Negative for dysuria, frequency and hematuria.  Neurological:  Negative for dizziness and headaches.    Objective:   Today's Vitals: There were no vitals taken for this visit.  Physical Exam Constitutional:      Appearance: Normal appearance.  HENT:     Head: Normocephalic and atraumatic.  Eyes:     Conjunctiva/sclera: Conjunctivae normal.  Cardiovascular:     Rate and Rhythm: Normal rate and regular rhythm.  Pulmonary:     Effort: Pulmonary effort is normal.     Breath sounds: Normal breath sounds.  Abdominal:     Tenderness: There is no right CVA tenderness or left CVA tenderness.  Musculoskeletal:     Right lower leg: No edema.     Left lower leg: No edema.  Skin:    General: Skin is warm and dry.  Neurological:     General: No focal deficit present.     Mental Status: She is alert. Mental status is at baseline.  Psychiatric:        Mood and Affect: Mood normal.        Behavior: Behavior normal.     Assessment & Plan:   1. Right flank pain: Urine in the office negative for signs of infection.  - POCT Urinalysis Dipstick  2. Essential hypertension/Atrial  fibrillation, unspecified type [facility]): Blood pressure stable, no changes to medications made.  3. Type 2 diabetes mellitus with diabetic polyneuropathy, with long-term current use of insulin (HCC): Last A1c improved to 7.6%.  Continue metformin at 1000 mg twice daily, Lantus 24 to 26 units at night, plan to recheck A1c in 2 months.   Follow-up: No follow-ups on file.   Margarita Mail, DO

## 2023-08-08 ENCOUNTER — Ambulatory Visit: Payer: Medicare Other | Admitting: Internal Medicine

## 2023-08-08 NOTE — Telephone Encounter (Signed)
Copied from CRM [id number]. Topic: General - Other >> Aug 08, 2023 11:36 AM Turkey B wrote: Reason for CRM: pt called in to see if she had a B12 shot in October

## 2023-08-10 DIAGNOSIS — L821 Other seborrheic keratosis: Secondary | ICD-10-CM | POA: Diagnosis not present

## 2023-08-10 DIAGNOSIS — D225 Melanocytic nevi of trunk: Secondary | ICD-10-CM | POA: Diagnosis not present

## 2023-08-10 DIAGNOSIS — L812 Freckles: Secondary | ICD-10-CM | POA: Diagnosis not present

## 2023-08-10 DIAGNOSIS — Z85828 Personal history of other malignant neoplasm of skin: Secondary | ICD-10-CM | POA: Diagnosis not present

## 2023-08-15 ENCOUNTER — Other Ambulatory Visit: Payer: Self-pay | Admitting: Internal Medicine

## 2023-08-15 DIAGNOSIS — E1165 Type 2 diabetes mellitus with hyperglycemia: Secondary | ICD-10-CM

## 2023-08-15 NOTE — Progress Notes (Signed)
Established Patient Office Visit  Subjective:  Patient ID: Danielle [name], female    DOB: 07-14-1935  Age: 88 y.o. MRN: [id number]  CC:  No chief complaint on file.   HPI Danielle [name] here for follow up on chronic medical conditions.    Hypertension/A.Fib: -Medications: Diltiazem 120 mg, Metoprolol 50 mg, Xarelto 20 mg -Follows with Cardiology, last seen 04/18/2023 -Patient is compliant with above medications and reports no side effects. -Denies any SOB, CP, vision changes, LE edema or symptoms of hypotension.  Does have some mild fatigue.  HLD: -Medications: Lipitor 10 mg -Patient is compliant with above medications and reports no side effects.  -Last lipid panel:  Lipid Panel     Component Value Date/Time   CHOL 108 11/30/2022 0951   TRIG 192 (H) 11/30/2022 0951   HDL 38 (L) 11/30/2022 0951   CHOLHDL 2.8 11/30/2022 0951   VLDL 45 (H) 11/10/2008 0317   LDLCALC 44 11/30/2022 0951    Diabetes, Type 2 with Neuropathy: -Last A1c 7.3% 9/24 -Medications: Metformin 1000 mg BID, Lantus 24-26 units at night, Gabapentin 300 mg TID -Patient is compliant with the above medications and reports no side effects.  -Checking BG at home: fasting 78-135 -Eye exam: UTD 9/23 -Foot exam: Follows with Podiatry, just seen on 06/01/2023 -Microalbumin: UTD 2/24 -Statin: yes -PNA vaccine: UTD -Denies symptoms of hypoglycemia, polyuria, polydipsia, numbness extremities, foot ulcers/trauma.   Iron Deficiency Anemia/Vitamin B12 deficiency: -Had been on IV iron in the past, but not on any more -Following with Hematology, following in November to repeat labs -Currently on IM Vitamin B12 injection every 4 weeks   GERD: -Currently on Prilosec 40, doing well  Sensorineural Hearing Loss in left ear due to COVID: -Following with [facility], note from 01/25/22 reviewed, plan on repeating hearing test and continuing hearing aids.  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  -Having more balance changes with worsening tinnitus bilaterally (worse on left). No ear pain/pressure. No recent viral illness, does have some post-nasal drip.  Gout: -Recently had a flare a few weeks ago in the first digit of her left foot.  She took colchicine and her symptoms resolved -Has had a total of 4 flares  Health maintenance: -Blood work UTD -Mammogram 7/24 Birads-1 - will discuss next summer if the patient would like to continue with screening  -Colonoscopy 6/22, repeat every 5 years   Past Medical History:  Diagnosis Date   Anemia    Anxiety    Arrhythmia    Arthritis    Atrial fibrillation (HCC)    Cataract    bilateral   Colon polyps    Diabetes mellitus without complication (HCC)    type two   Dyspnea    GERD (gastroesophageal reflux disease)    Hypercholesteremia    Hypertension    Iron deficiency anemia    Pancreatitis    Peripheral neuropathy    Refusal of blood transfusions as patient is Jehovah's Witness    Squamous cell skin cancer     Past Surgical History:  Procedure Laterality Date   APPENDECTOMY  1957   BREAST EXCISIONAL BIOPSY Right 1966   CATARACT EXTRACTION, BILATERAL     CHOLECYSTECTOMY  10/2008   lump removed right breast  1958   bENIGN   right ankle plate  [id number]  TOTAL HIP ARTHROPLASTY Left 07/13/2023   Procedure: TOTAL HIP ARTHROPLASTY ANTERIOR APPROACH;  Surgeon: Samson Frederic, MD;  Location: [facility];  Service: Orthopedics;  Laterality: Left;   VAGINAL HYSTERECTOMY  1978    Family History  Problem Relation Age of Onset   Hypertension Mother    Stroke Mother    Diabetes Mother    Colon cancer Mother 86   Cerebral aneurysm Father    Colon cancer Sister        in her 36s   CAD Sister    Heart attack Sister    Uterine cancer Sister        in her late 71s   Colon cancer Sister         in her 82s   Parkinsonism Sister    Dementia Sister    Heart attack Brother    Heart attack Brother    Throat cancer Brother    Atrial fibrillation Brother    Breast cancer Neg Hx     Social History   Socioeconomic History   Marital status: Widowed    Spouse name: Not on file   Number of children: 3   Years of education: Not on file   Highest education level: Not on file  Occupational History   Occupation: Retired  Tobacco Use   Smoking status: Never   Smokeless tobacco: Never  Vaping Use   Vaping status: Never Used  Substance and Sexual Activity   Alcohol use: No    Alcohol/week: 0.0 standard drinks of alcohol   Drug use: No   Sexual activity: Not Currently  Other Topics Concern   Not on file  Social History Narrative   Not on file   Social Determinants of Health   Financial Resource Strain: Low Risk  (09/08/2022)   Overall Financial Resource Strain (CARDIA)    Difficulty of Paying Living Expenses: Not very hard  Food Insecurity: No Food Insecurity (07/13/2023)   Hunger Vital Sign    Worried About Running Out of Food in the Last Year: Never true    Ran Out of Food in the Last Year: Never true  Transportation Needs: No Transportation Needs (07/13/2023)   PRAPARE - Administrator, Civil Service (Medical): No    Lack of Transportation (Non-Medical): No  Physical Activity: Inactive (09/08/2022)   Exercise Vital Sign    Days of Exercise per Week: 0 days    Minutes of Exercise per Session: 0 min  Stress: No Stress Concern Present (09/08/2022)   [employer] of Occupational Health - Occupational Stress Questionnaire    [name] : Only a little  Social Connections: Moderately Integrated (09/08/2022)   Social Connection and Isolation Panel [NHANES]    Frequency of Communication with Friends and Family: More than three times a week    Frequency of Social Gatherings with Friends and Family: More than three times a week    Attends  Religious Services: More than 4 times per year    Active Member of [employer] or Organizations: Yes    Attends Banker Meetings: More than 4 times per year    Marital Status: Widowed  Intimate Partner Violence: Not At Risk (07/13/2023)   Humiliation, Afraid, Rape, and Kick questionnaire    Fear of Current or Ex-Partner: No    Emotionally Abused: No    Physically Abused: No    Sexually Abused: No    ROS Review of Systems  Constitutional:  Positive for fatigue. Negative for chills  and fever.  HENT:  Positive for hearing loss. Negative for ear pain.   Eyes:  Negative for visual disturbance.  Respiratory:  Negative for cough and shortness of breath.   Cardiovascular:  Negative for chest pain and palpitations.  Gastrointestinal:  Negative for abdominal pain.  Genitourinary:  Positive for flank pain. Negative for dysuria, frequency and hematuria.  Neurological:  Negative for dizziness and headaches.    Objective:   Today's Vitals: There were no vitals taken for this visit.  Physical Exam Constitutional:      Appearance: Normal appearance.  HENT:     Head: Normocephalic and atraumatic.  Eyes:     Conjunctiva/sclera: Conjunctivae normal.  Cardiovascular:     Rate and Rhythm: Normal rate and regular rhythm.  Pulmonary:     Effort: Pulmonary effort is normal.     Breath sounds: Normal breath sounds.  Abdominal:     Tenderness: There is no right CVA tenderness or left CVA tenderness.  Musculoskeletal:     Right lower leg: No edema.     Left lower leg: No edema.  Skin:    General: Skin is warm and dry.  Neurological:     General: No focal deficit present.     Mental Status: She is alert. Mental status is at baseline.  Psychiatric:        Mood and Affect: Mood normal.        Behavior: Behavior normal.     Assessment & Plan:   1. Right flank pain: Urine in the office negative for signs of infection.  - POCT Urinalysis Dipstick  2. Essential hypertension/Atrial  fibrillation, unspecified type [facility]): Blood pressure stable, no changes to medications made.  3. Type 2 diabetes mellitus with diabetic polyneuropathy, with long-term current use of insulin (HCC): Last A1c improved to 7.6%.  Continue metformin at 1000 mg twice daily, Lantus 24 to 26 units at night, plan to recheck A1c in 2 months.   Follow-up: No follow-ups on file.   Margarita Mail, DO

## 2023-08-17 NOTE — Telephone Encounter (Signed)
Requested Prescriptions  Pending Prescriptions Disp Refills   glucose blood (ACCU-CHEK GUIDE) test strip [Pharmacy Med Name: Accu-Chek Guide In Vitro Strip] 300 each 0    Sig: USE AS DIRECTED THREE TIMES DAILY     Endocrinology: Diabetes - Testing Supplies Passed - 08/15/2023  6:22 PM      Passed - Valid encounter within last 12 months    Recent Outpatient Visits           2 months ago Right flank pain   [facility] Margarita Mail, DO   3 months ago Pre-op evaluation   [facility] Margarita Mail, DO   5 months ago Type 2 diabetes mellitus with diabetic polyneuropathy, with long-term current use of insulin [facility])   [facility] Margarita Mail, DO   8 months ago Diabetic peripheral neuropathy associated with type 2 diabetes mellitus [facility])   [facility] Margarita Mail, DO   11 months ago Type 2 diabetes mellitus with hyperglycemia, with long-term current use of insulin [facility])   [facility] Margarita Mail, DO       Future Appointments             Tomorrow Margarita Mail, DO [facility]   In 4 weeks  [facility]   In 1 month Margarita Mail, DO [facility]

## 2023-08-18 ENCOUNTER — Ambulatory Visit (INDEPENDENT_AMBULATORY_CARE_PROVIDER_SITE_OTHER): Payer: Medicare Other

## 2023-08-18 DIAGNOSIS — E538 Deficiency of other specified B group vitamins: Secondary | ICD-10-CM

## 2023-08-18 MED ORDER — CYANOCOBALAMIN 1000 MCG/ML IJ SOLN
1000.0000 ug | Freq: Once | INTRAMUSCULAR | Status: AC
Start: 2023-08-18 — End: 2023-08-18
  Administered 2023-08-18: 1000 ug via INTRAMUSCULAR

## 2023-08-21 DIAGNOSIS — E119 Type 2 diabetes mellitus without complications: Secondary | ICD-10-CM | POA: Diagnosis not present

## 2023-08-21 DIAGNOSIS — H43813 Vitreous degeneration, bilateral: Secondary | ICD-10-CM | POA: Diagnosis not present

## 2023-08-21 DIAGNOSIS — Z961 Presence of intraocular lens: Secondary | ICD-10-CM | POA: Diagnosis not present

## 2023-08-21 LAB — HM DIABETES EYE EXAM

## 2023-08-25 ENCOUNTER — Emergency Department (HOSPITAL_COMMUNITY): Payer: Medicare Other

## 2023-08-25 ENCOUNTER — Emergency Department (HOSPITAL_COMMUNITY)
Admission: EM | Admit: 2023-08-25 | Discharge: 2023-08-25 | Disposition: A | Discharge status: Home / Self Care | Payer: Medicare Other | Attending: Emergency Medicine | Admitting: Emergency Medicine

## 2023-08-25 ENCOUNTER — Encounter (HOSPITAL_COMMUNITY): Payer: Self-pay | Admitting: Emergency Medicine

## 2023-08-25 ENCOUNTER — Other Ambulatory Visit: Payer: Self-pay

## 2023-08-25 DIAGNOSIS — R519 Headache, unspecified: Secondary | ICD-10-CM | POA: Diagnosis present

## 2023-08-25 DIAGNOSIS — I672 Cerebral atherosclerosis: Secondary | ICD-10-CM | POA: Diagnosis not present

## 2023-08-25 DIAGNOSIS — Z794 Long term (current) use of insulin: Secondary | ICD-10-CM | POA: Insufficient documentation

## 2023-08-25 DIAGNOSIS — Z7901 Long term (current) use of anticoagulants: Secondary | ICD-10-CM | POA: Diagnosis not present

## 2023-08-25 DIAGNOSIS — W01198A Fall on same level from slipping, tripping and stumbling with subsequent striking against other object, initial encounter: Secondary | ICD-10-CM | POA: Insufficient documentation

## 2023-08-25 DIAGNOSIS — W19XXXA Unspecified fall, initial encounter: Secondary | ICD-10-CM

## 2023-08-25 DIAGNOSIS — S299XXA Unspecified injury of thorax, initial encounter: Secondary | ICD-10-CM | POA: Diagnosis not present

## 2023-08-25 DIAGNOSIS — S0181XA Laceration without foreign body of other part of head, initial encounter: Secondary | ICD-10-CM

## 2023-08-25 DIAGNOSIS — I517 Cardiomegaly: Secondary | ICD-10-CM | POA: Diagnosis not present

## 2023-08-25 DIAGNOSIS — R58 Hemorrhage, not elsewhere classified: Secondary | ICD-10-CM | POA: Diagnosis not present

## 2023-08-25 DIAGNOSIS — Z96642 Presence of left artificial hip joint: Secondary | ICD-10-CM | POA: Diagnosis not present

## 2023-08-25 DIAGNOSIS — I6782 Cerebral ischemia: Secondary | ICD-10-CM | POA: Diagnosis not present

## 2023-08-25 DIAGNOSIS — S0990XA Unspecified injury of head, initial encounter: Secondary | ICD-10-CM | POA: Diagnosis not present

## 2023-08-25 DIAGNOSIS — I700 Atherosclerosis of aorta: Secondary | ICD-10-CM | POA: Diagnosis not present

## 2023-08-25 DIAGNOSIS — S3993XA Unspecified injury of pelvis, initial encounter: Secondary | ICD-10-CM | POA: Diagnosis not present

## 2023-08-25 LAB — BASIC METABOLIC PANEL
Anion gap: 15 (ref 5–15)
BUN: 9 mg/dL (ref 8–23)
CO2: 23 mmol/L (ref 22–32)
Calcium: 9.3 mg/dL (ref 8.9–10.3)
Chloride: 101 mmol/L (ref 98–111)
Creatinine, Ser: 0.75 mg/dL (ref 0.44–1.00)
GFR, Estimated: >60 mL/min (ref >60)
Glucose, Bld: 167 mg/dL — ABNORMAL HIGH (ref 70–99)
Potassium: 4.2 mmol/L (ref 3.5–5.1)
Sodium: 139 mmol/L (ref 135–145)

## 2023-08-25 LAB — CBC
HCT: 39 % (ref 36.0–46.0)
Hemoglobin: 12.1 g/dL (ref 12.0–15.0)
MCH: 28.3 pg (ref 26.0–34.0)
MCHC: 31 g/dL (ref 30.0–36.0)
MCV: 91.3 fL (ref 80.0–100.0)
Platelets: 341 10*3/uL (ref 150–400)
RBC: 4.27 MIL/uL (ref 3.87–5.11)
RDW: 13.3 % (ref 11.5–15.5)
WBC: 8.1 10*3/uL (ref 4.0–10.5)
nRBC: 0 % (ref 0.0–0.2)

## 2023-08-25 MED ORDER — ACETAMINOPHEN 500 MG PO TABS
1000.0000 mg | ORAL_TABLET | Freq: Once | ORAL | Status: AC
  Administered 2023-08-25: 1000 mg via ORAL
  Filled 2023-08-25: qty 2

## 2023-08-25 MED ORDER — BACITRACIN ZINC 500 UNIT/GM EX OINT: TOPICAL_OINTMENT | Freq: Once | CUTANEOUS | Status: DC

## 2023-08-25 MED ORDER — LIDOCAINE-EPINEPHRINE (PF) 2 %-1:200000 IJ SOLN
5.0000 mL | Freq: Once | INTRAMUSCULAR | Status: AC
  Administered 2023-08-25: 5 mL
  Filled 2023-08-25: qty 20

## 2023-08-25 NOTE — ED Notes (Addendum)
No c-collar

## 2023-08-25 NOTE — ED Provider Notes (Signed)
[place] EMERGENCY DEPARTMENT AT [facility] Provider Note   CSN: [id number] Arrival date & time: 08/25/23  1947     History {Add pertinent medical, surgical, social history, OB history to HPI:1} Chief Complaint  Patient presents with   Danielle [name] is a 88 y.o. female.  HPI While she was in her bedroom and she was going to stand up and lost her balance.  She fell and hit her forehead on the floor.  She reports she was not really able to break her fall.  She denies that she got knocked out.  She reports she does have a frontal headache and got a laceration to her forehead.  Patient denies other areas of pain.  She reports she was able to position herself to slide on her wooden floor and get to the phone and call her neighbor.  Neighbor then came and assisted to call EMS.  Patient denies she felt any weakness numbness or tingling.  She denies neck pain.  She reports she is a little sore at her left hip but had a recent hip replacement.  She denies that she has severe pain or feels like she cannot move it.  Patient is anticoagulated on Xarelto.    Home Medications Prior to Admission medications   Medication Sig Start Date End Date Taking? Authorizing Provider  atorvastatin (LIPITOR) 10 MG tablet Take 1 tablet by mouth once daily 03/01/23  Yes Margarita Mail, DO  calcium carbonate (OSCAL) 1500 (600 Ca) MG TABS tablet Take 600 mg of elemental calcium by mouth 2 (two) times daily with a meal.   Yes [provider]  Cholecalciferol (VITAMIN D3) 1000 UNITS CAPS Take 1,000 Units by mouth in the morning and at bedtime.   Yes [provider]  colchicine 0.6 MG tablet Take 1 tablet by mouth once daily Patient taking differently: Take 0.6 mg by mouth daily as needed (gout flares). 01/24/23  Yes Margarita Mail, DO  cyanocobalamin (,VITAMIN B-12,) 1000 MCG/ML injection Inject 1,000 mcg into the muscle every 30 (thirty) days.   Yes [provider]   diltiazem (DILT-XR) 120 MG 24 hr capsule Take 1 capsule by mouth once daily 07/26/23  Yes O'Neal, Ronnald Ramp, MD  gabapentin (NEURONTIN) 300 MG capsule TAKE 3 CAPSULES BY MOUTH AT BEDTIME 03/28/23  Yes Margarita Mail, DO  HYDROcodone-acetaminophen (NORCO/VICODIN) 5-325 MG tablet Take 1 tablet by mouth every 4 (four) hours as needed. 07/21/23  Yes [provider]  LANTUS 100 UNIT/ML injection INJECT 24 TO 26 UNITS SUBCUTANEOUSLY AT BEDTIME Patient taking differently: Inject 22-26 Units into the skin at bedtime. 08/07/23  Yes Margarita Mail, DO  metFORMIN (GLUCOPHAGE) 1000 MG tablet Take 1 tablet (1,000 mg total) by mouth 2 (two) times daily. 03/07/23  Yes Margarita Mail, DO  metoprolol succinate (TOPROL-XL) 50 MG 24 hr tablet TAKE 1 TABLET BY MOUTH TWICE DAILY (MORNING  AT  AT  BEDTIME)  WITH  OR  IMMEDIATELY  FOLLOWING  A  MEAL Patient taking differently: Take 50 mg by mouth See admin instructions. Take 1 tablet by mouth twice a day (morning and bedtime) with or immediately following a meal 11/11/22  Yes O'Neal, Ronnald Ramp, MD  omeprazole (PRILOSEC) 40 MG capsule Take 1 capsule (40 mg total) by mouth daily. Patient taking differently: Take 40 mg by mouth daily as needed. 11/30/22  Yes Margarita Mail, DO  ondansetron (ZOFRAN) 4 MG tablet Take 1 tablet (4 mg total) by mouth every 8 (  eight) hours as needed for nausea or vomiting. 07/14/23 07/13/24 Yes Hill, Alain Honey, PA-C  Polyethyl Glycol-Propyl Glycol (SYSTANE) 0.4-0.3 % SOLN Place 1-2 drops into both eyes 3 (three) times daily as needed (dry/irritated eyes).   Yes [provider]  rivaroxaban (XARELTO) 20 MG TABS tablet TAKE 1 TABLET BY MOUTH ONCE DAILY WITH SUPPER 03/30/23  Yes O'Neal, Ronnald Ramp, MD  UNABLE TO FIND Take 1 tablet by mouth daily as needed. Med Name: alka seltzers   Yes [provider]  Accu-Chek Softclix Lancets lancets Use as instructed 07/15/22   Margarita Mail, DO  Blood Glucose  Monitoring Suppl (ACCU-CHEK GUIDE) w/Device KIT DxE11.65 Check BG tid 07/15/22   Margarita Mail, DO  glucose blood (ACCU-CHEK GUIDE) test strip USE AS DIRECTED THREE TIMES DAILY 08/17/23   Margarita Mail, DO  Insulin Pen Needle (PEN NEEDLES) 31G X 8 MM MISC 1 each by Does not apply route 2 (two) times daily. 03/07/23   Margarita Mail, DO  Insulin Syringe-Needle U-100 (B-D INS SYR ULTRAFINE .5CC/30G) 30G X 1/2" 0.5 ML MISC 1 each by Does not apply route daily. 03/21/23   Margarita Mail, DO      Allergies    Amoxicillin, Hydrochlorothiazide, Shellfish allergy, Shrimp (diagnostic), Cleocin [clindamycin hcl], Codeine, and Penicillins    Review of Systems   Review of Systems  Physical Exam Updated Vital Signs BP (!) 178/83   Pulse 66   Temp 98.6 F (37 C) (Oral)   Resp (!) 24   Ht 5\' 4"  (1.626 m)   Wt 75.3 kg   SpO2 100%   BMI 28.49 kg/m  Physical Exam Constitutional:      Comments: Alert nontoxic.  GCS 15.  No respiratory distress.  Well-nourished well-developed.  Clear mental status.  HENT:     Head:     Comments: Patient has an approximately 1 and half centimeter laceration to her left forehead.  No active bleeding.  Slight surrounding hematoma.    Nose: Nose normal.     Mouth/Throat:     Mouth: Mucous membranes are moist.     Pharynx: Oropharynx is clear.  Eyes:     Extraocular Movements: Extraocular movements intact.     Pupils: Pupils are equal, round, and reactive to light.  Neck:     Comments: No C-spine tenderness. Cardiovascular:     Rate and Rhythm: Normal rate. Rhythm irregular.  Pulmonary:     Effort: Pulmonary effort is normal.     Breath sounds: Normal breath sounds.  Chest:     Chest wall: No tenderness.  Abdominal:     General: There is no distension.     Palpations: Abdomen is soft.     Tenderness: There is no abdominal tenderness. There is no guarding.  Musculoskeletal:        General: No swelling, tenderness, deformity or signs of injury.  Normal range of motion.     Right lower leg: No edema.     Left lower leg: No edema.  Skin:    General: Skin is warm and dry.  Neurological:     General: No focal deficit present.     Mental Status: She is oriented to person, place, and time.     Motor: No weakness.     Coordination: Coordination normal.  Psychiatric:        Mood and Affect: Mood normal.     ED Results / Procedures / Treatments   Labs (all labs ordered are listed, but only abnormal results  are displayed) Labs Reviewed  BASIC METABOLIC PANEL - Abnormal; Notable for the following components:      Result Value   Glucose, Bld 167 (*)    All other components within normal limits  CBC    EKG None  Radiology CT HEAD WO CONTRAST  Result Date: 08/25/2023 CLINICAL DATA:  Trauma/fall EXAM: CT HEAD WITHOUT CONTRAST TECHNIQUE: Contiguous axial images were obtained from the base of the skull through the vertex without intravenous contrast. RADIATION DOSE REDUCTION: This exam was performed according to the departmental dose-optimization program which includes automated exposure control, adjustment of the mA and/or kV according to patient size and/or use of iterative reconstruction technique. COMPARISON:  None Available. FINDINGS: Brain: No evidence of acute infarction, hemorrhage, hydrocephalus, extra-axial collection or mass lesion/mass effect. Subcortical white matter and periventricular small vessel ischemic changes. Global cortical atrophy. Vascular: Intracranial atherosclerosis. Skull: Normal. Negative for fracture or focal lesion. Sinuses/Orbits: The visualized paranasal sinuses are essentially clear. The mastoid air cells are unopacified. Other: None. IMPRESSION: No acute intracranial abnormality. Atrophy with small vessel ischemic changes. Electronically Signed   By: Charline Bills M.D.   On: 08/25/2023 21:09   DG Chest Port 1 View  Result Date: 08/25/2023 CLINICAL DATA:  Trauma/fall EXAM: PORTABLE CHEST 1 VIEW  COMPARISON:  11/10/2008 FINDINGS: Lungs are clear.  No pleural effusion or pneumothorax. Cardiomegaly.  Thoracic aortic atherosclerosis. IMPRESSION: No acute cardiopulmonary disease. Cardiomegaly. Electronically Signed   By: Charline Bills M.D.   On: 08/25/2023 21:05   DG Pelvis Portable  Result Date: 08/25/2023 CLINICAL DATA:  Trauma/fall EXAM: PORTABLE PELVIS 1-2 VIEWS COMPARISON:  None Available. FINDINGS: No fracture or dislocation is seen. Left hip arthroplasty, without evidence of complication. IMPRESSION: No fracture or dislocation is seen. Left hip arthroplasty, without evidence of complication. Electronically Signed   By: Charline Bills M.D.   On: 08/25/2023 21:04    Procedures Procedures  {Document cardiac monitor, telemetry assessment procedure when appropriate:1}  Medications Ordered in ED Medications  bacitracin ointment (has no administration in time range)  acetaminophen (TYLENOL) tablet 1,000 mg (1,000 mg Oral Given 08/25/23 2042)  lidocaine-EPINEPHrine (XYLOCAINE W/EPI) 2 %-1:200000 (PF) injection 5 mL (5 mLs Infiltration Given by Other 08/25/23 2042)    ED Course/ Medical Decision Making/ A&P   {   Click here for ABCD2, HEART and other calculatorsREFRESH Note before signing :1}                              Medical Decision Making Amount and/or Complexity of Data Reviewed Labs: ordered. Radiology: ordered.  Risk OTC drugs. Prescription drug management.   ***  {Document critical care time when appropriate:1} {Document review of labs and clinical decision tools ie heart score, Chads2Vasc2 etc:1}  {Document your independent review of radiology images, and any outside records:1} {Document your discussion with family members, caretakers, and with consultants:1} {Document social determinants of health affecting pt's care:1} {Document your decision making why or why not admission, treatments were needed:1} Final Clinical Impression(s) / ED Diagnoses Final  diagnoses:  Fall, initial encounter  Laceration of forehead, initial encounter  Anticoagulated  Injury of head, initial encounter    Rx / DC Orders ED Discharge Orders     None

## 2023-08-25 NOTE — ED Triage Notes (Addendum)
Level 2 Fall on Blood Thinner - Xarelto   Pt arrives via [facility] from home. Sts she was going to stand up when she lost her balance and fell landing onto her left side. Sts she was unable to get herself up from the floor, was able to reach for phone calling for help. On arrival complains of left hip pain, head pain, and has left forehead laceration, approx 2-3 cm. No vision changes/NV/neck pain. No Loc. A&Ox4

## 2023-08-25 NOTE — Progress Notes (Signed)
   08/25/23 2004  Spiritual Encounters  Type of Visit Initial  Care provided to: Pt and family  Referral source Chaplain assessment;Trauma page  Reason for visit Trauma  OnCall Visit Yes   Responded to FOT in trauma room C, met with patient and two daughters. Offered spiritual care.

## 2023-08-25 NOTE — Discharge Instructions (Addendum)
You should be seen for suture removal in 5 to 7 days. Review head injury instructions.  Return immediately if you have sudden worsening headache, confusion, vomiting or other concerning changes. See your doctor for recheck in 5-7 days.

## 2023-08-25 NOTE — ED Notes (Signed)
Pt to CT at this time.

## 2023-08-25 NOTE — Progress Notes (Signed)
Orthopedic Tech Progress Note Patient Details:  Danielle [name] 20-Jul-1935 [id number]  Level 2 trauma   Patient ID: Ashok Norris, female   DOB: [birth year], 88 y.o.   MRN: [id number]  Donald Pore 08/25/2023, 9:01 PM

## 2023-08-25 NOTE — ED Notes (Signed)
Trauma Response Nurse Documentation   Danielle [name] is a 88 y.o. female arriving to [name] via [name]  On Xarelto (rivaroxaban) daily. Trauma was activated as a Level 2 by [facility]le [name] based on the following trauma criteria Elderly patients > 65 with head trauma on anti-coagulation (excluding ASA).  Patient cleared for CT by Dr. Lowella [name]. Pt transported to CT with trauma response nurse present to monitor. RN remained with the patient throughout their absence from the department for clinical observation.   GCS 15.  History   Past Medical History:  Diagnosis Date   Anemia    Anxiety    Arrhythmia    Arthritis    Atrial fibrillation (HCC)    Cataract    bilateral   Colon polyps    Diabetes mellitus without complication (HCC)    type two   Dyspnea    GERD (gastroesophageal reflux disease)    Hypercholesteremia    Hypertension    Iron deficiency anemia    Pancreatitis    Peripheral neuropathy    Refusal of blood transfusions as patient is Jehovah's Witness    Squamous cell skin cancer      Past Surgical History:  Procedure Laterality Date   APPENDECTOMY  1957   BREAST EXCISIONAL BIOPSY Right 1966   CATARACT EXTRACTION, BILATERAL     CHOLECYSTECTOMY  10/2008   lump removed right breast  1958   bENIGN   right ankle plate  1610   TOTAL HIP ARTHROPLASTY Left 07/13/2023   Procedure: TOTAL HIP ARTHROPLASTY ANTERIOR APPROACH;  Surgeon: Danielle Frederic, MD;  Location: [facility];  Service: Orthopedics;  Laterality: Left;   VAGINAL HYSTERECTOMY  1978       Initial Focused Assessment (If applicable, or please see trauma documentation): Airway-- intact, no visible obstruction Breathing-- spontaneous, unlabored Circulation-- laceration to left forehead, bleeding controlled on arrival  CT's Completed:   CT Head  Interventions:  See event summary  Plan for disposition:  Discharge home   Consults completed:  none at 2223.  Event Summary: Patient  brought in by [name] Patient with mechanical fall this evening, stuck her head on floor. Patient arrives alert and oriented x4, GCS 15. Patient transferred from EMS stretcher to [name] stretcher. Manual BP obtained. Lab work obtained. Patient main complaint of headache and minor hip pain. Xray chest and pelvis completed. Patient to CT with primary RN. CT head completed.  MTP Summary (If applicable):  N/A  Bedside handoff with ED RN Danielle [name].    [facility]le [name]  Trauma Response RN  Please call TRN at [phone] for further assistance.

## 2023-08-28 ENCOUNTER — Other Ambulatory Visit: Payer: Self-pay | Admitting: Orthopedic Surgery

## 2023-08-28 DIAGNOSIS — Z471 Aftercare following joint replacement surgery: Secondary | ICD-10-CM | POA: Diagnosis not present

## 2023-08-28 DIAGNOSIS — Z96642 Presence of left artificial hip joint: Secondary | ICD-10-CM | POA: Diagnosis not present

## 2023-08-28 DIAGNOSIS — Z5189 Encounter for other specified aftercare: Secondary | ICD-10-CM

## 2023-08-28 NOTE — Telephone Encounter (Signed)
Copied from CRM [id number]. Topic: Appointment Scheduling - Scheduling Inquiry for Clinic >> Aug 28, 2023  1:50 PM Phill Myron wrote: Please call Ms Sagona, she needs a hospital follow up and her stitches removed. (ER suggest 5 to 7 days)  Dr's next available time is 11/19. Please advise.

## 2023-08-28 NOTE — Telephone Encounter (Signed)
Pt has an appt on Wednesday 08/30/23 with Denny Peon

## 2023-08-29 ENCOUNTER — Inpatient Hospital Stay: Payer: Medicare Other | Attending: Internal Medicine

## 2023-08-29 ENCOUNTER — Inpatient Hospital Stay (HOSPITAL_BASED_OUTPATIENT_CLINIC_OR_DEPARTMENT_OTHER): Payer: Medicare Other | Admitting: Internal Medicine

## 2023-08-29 ENCOUNTER — Ambulatory Visit
Admission: RE | Admit: 2023-08-29 | Discharge: 2023-08-29 | Disposition: A | Discharge status: Home / Self Care | Payer: Medicare Other | Source: Ambulatory Visit | Attending: Orthopedic Surgery | Admitting: Orthopedic Surgery

## 2023-08-29 VITALS — BP 158/97 | HR 102 | Temp 98.3°F | Resp 18

## 2023-08-29 DIAGNOSIS — Z96642 Presence of left artificial hip joint: Secondary | ICD-10-CM

## 2023-08-29 DIAGNOSIS — D509 Iron deficiency anemia, unspecified: Secondary | ICD-10-CM | POA: Diagnosis not present

## 2023-08-29 DIAGNOSIS — I4891 Unspecified atrial fibrillation: Secondary | ICD-10-CM | POA: Diagnosis not present

## 2023-08-29 DIAGNOSIS — D500 Iron deficiency anemia secondary to blood loss (chronic): Secondary | ICD-10-CM

## 2023-08-29 DIAGNOSIS — Z5189 Encounter for other specified aftercare: Secondary | ICD-10-CM

## 2023-08-29 DIAGNOSIS — Z7901 Long term (current) use of anticoagulants: Secondary | ICD-10-CM | POA: Diagnosis not present

## 2023-08-29 DIAGNOSIS — M25552 Pain in left hip: Secondary | ICD-10-CM | POA: Diagnosis not present

## 2023-08-29 LAB — IRON AND IRON BINDING CAPACITY (CC-WL,HP ONLY)
Iron: 9 ug/dL — ABNORMAL LOW (ref 28–170)
Saturation Ratios: 3 % — ABNORMAL LOW (ref 10.4–31.8)
TIBC: 332 ug/dL (ref 250–450)
UIBC: 323 ug/dL (ref 148–442)

## 2023-08-29 LAB — CBC WITH DIFFERENTIAL (CANCER CENTER ONLY)
Abs Immature Granulocytes: 0.02 10*3/uL (ref 0.00–0.07)
Basophils Absolute: 0.1 10*3/uL (ref 0.0–0.1)
Basophils Relative: 1 %
Eosinophils Absolute: 0.2 10*3/uL (ref 0.0–0.5)
Eosinophils Relative: 2 %
HCT: 32 % — ABNORMAL LOW (ref 36.0–46.0)
Hemoglobin: 10.1 g/dL — ABNORMAL LOW (ref 12.0–15.0)
Immature Granulocytes: 0 %
Lymphocytes Relative: 19 %
Lymphs Abs: 1.9 10*3/uL (ref 0.7–4.0)
MCH: 28.4 pg (ref 26.0–34.0)
MCHC: 31.6 g/dL (ref 30.0–36.0)
MCV: 89.9 fL (ref 80.0–100.0)
Monocytes Absolute: 0.8 10*3/uL (ref 0.1–1.0)
Monocytes Relative: 8 %
Neutro Abs: 7.1 10*3/uL (ref 1.7–7.7)
Neutrophils Relative %: 70 %
Platelet Count: 318 10*3/uL (ref 150–400)
RBC: 3.56 MIL/uL — ABNORMAL LOW (ref 3.87–5.11)
RDW: 13.5 % (ref 11.5–15.5)
WBC Count: 10 10*3/uL (ref 4.0–10.5)
nRBC: 0 % (ref 0.0–0.2)

## 2023-08-29 LAB — VITAMIN B12: Vitamin B-12: 287 pg/mL (ref 180–914)

## 2023-08-29 NOTE — Progress Notes (Signed)
[facility] [phone]   [phone]  OFFICE PROGRESS NOTE  Margarita Mail, DO [address] [place] [address]  DIAGNOSIS:  Persistent microcytic anemia secondary to iron deficiency of unclear etiology but could be secondary to malabsorption or occult gastrointestinal bleeding.  She underwent upper endoscopy and colonoscopy that were unremarkable.  PRIOR THERAPY:  1) Venofer 300 mg IV weekly for 3 weeks.  CURRENT THERAPY: Vitamin B12 injection 1000 mcg intramuscular weekly for 4 weeks and then monthly.  This is giving at her primary care physician's office.  INTERVAL HISTORY: Danielle [name] 88 y.o. female returns to the clinic today for 54-month follow-up visit.Discussed the use of AI scribe software for clinical note transcription with the patient, who gave verbal consent to proceed.  History of Present Illness   Danielle [name], an 88 year old patient with a history of atrial fibrillation and anemia, presented for follow-up after a recent fall. The patient had undergone a hip replacement six weeks prior to the fall. During the fall, the patient sustained a significant injury to the forehead, resulting in substantial blood loss. The patient was on anticoagulation therapy at the time of the fall, which necessitated a CT scan to rule out intracranial bleeding. The scan results were reportedly normal.  The patient's hemoglobin levels dropped from 12.1 to 10.1 within a few days of the fall, likely due to the blood loss from the injury. The patient has been receiving monthly vitamin B12 injections from her primary care provider. However, she is not on any iron supplements as it was previously advised that her body would not absorb the medication.  The patient also reported that her atrial fibrillation, which had been in rhythm, went out of rhythm following the fall. The patient is currently on Xarelto for the atrial fibrillation and  Toprol XL 50 mg once daily. Despite this, the patient's heart rate was noted to be high.       MEDICAL HISTORY: Past Medical History:  Diagnosis Date   Anemia    Anxiety    Arrhythmia    Arthritis    Atrial fibrillation (HCC)    Cataract    bilateral   Colon polyps    Diabetes mellitus without complication (HCC)    type two   Dyspnea    GERD (gastroesophageal reflux disease)    Hypercholesteremia    Hypertension    Iron deficiency anemia    Pancreatitis    Peripheral neuropathy    Refusal of blood transfusions as patient is Jehovah's Witness    Squamous cell skin cancer     ALLERGIES:  is allergic to amoxicillin, hydrochlorothiazide, shellfish allergy, shrimp (diagnostic), cleocin [clindamycin hcl], codeine, and penicillins.  MEDICATIONS:  Current Outpatient Medications  Medication Sig Dispense Refill   Accu-Chek Softclix Lancets lancets Use as instructed 100 each 12   atorvastatin (LIPITOR) 10 MG tablet Take 1 tablet by mouth once daily 90 tablet 1   Blood Glucose Monitoring Suppl (ACCU-CHEK GUIDE) w/Device KIT DxE11.65 Check BG tid 1 kit 0   calcium carbonate (OSCAL) 1500 (600 Ca) MG TABS tablet Take 600 mg of elemental calcium by mouth 2 (two) times daily with a meal.     Cholecalciferol (VITAMIN D3) 1000 UNITS CAPS Take 1,000 Units by mouth in the morning and at bedtime.     colchicine 0.6 MG tablet Take 1 tablet by mouth once daily (Patient taking differently: Take 0.6 mg by mouth daily as needed (gout flares).)  20 tablet 0   cyanocobalamin (,VITAMIN B-12,) 1000 MCG/ML injection Inject 1,000 mcg into the muscle every 30 (thirty) days.     diltiazem (DILT-XR) 120 MG 24 hr capsule Take 1 capsule by mouth once daily 90 capsule 1   gabapentin (NEURONTIN) 300 MG capsule TAKE 3 CAPSULES BY MOUTH AT BEDTIME 270 capsule 3   glucose blood (ACCU-CHEK GUIDE) test strip USE AS DIRECTED THREE TIMES DAILY 300 each 0   HYDROcodone-acetaminophen (NORCO/VICODIN) 5-325 MG tablet Take 1  tablet by mouth every 4 (four) hours as needed.     Insulin Pen Needle (PEN NEEDLES) 31G X 8 MM MISC 1 each by Does not apply route 2 (two) times daily. 100 each 3   Insulin Syringe-Needle U-100 (B-D INS SYR ULTRAFINE .5CC/30G) 30G X 1/2" 0.5 ML MISC 1 each by Does not apply route daily. 100 each 3   LANTUS 100 UNIT/ML injection INJECT 24 TO 26 UNITS SUBCUTANEOUSLY AT BEDTIME (Patient taking differently: Inject 22-26 Units into the skin at bedtime.) 10 mL 1   metFORMIN (GLUCOPHAGE) 1000 MG tablet Take 1 tablet (1,000 mg total) by mouth 2 (two) times daily. 180 tablet 1   metoprolol succinate (TOPROL-XL) 50 MG 24 hr tablet TAKE 1 TABLET BY MOUTH TWICE DAILY (MORNING  AT  AT  BEDTIME)  WITH  OR  IMMEDIATELY  FOLLOWING  A  MEAL (Patient taking differently: Take 50 mg by mouth See admin instructions. Take 1 tablet by mouth twice a day (morning and bedtime) with or immediately following a meal) 180 tablet 3   omeprazole (PRILOSEC) 40 MG capsule Take 1 capsule (40 mg total) by mouth daily. (Patient taking differently: Take 40 mg by mouth daily as needed.) 90 capsule 1   ondansetron (ZOFRAN) 4 MG tablet Take 1 tablet (4 mg total) by mouth every 8 (eight) hours as needed for nausea or vomiting. 30 tablet 0   Polyethyl Glycol-Propyl Glycol (SYSTANE) 0.4-0.3 % SOLN Place 1-2 drops into both eyes 3 (three) times daily as needed (dry/irritated eyes).     rivaroxaban (XARELTO) 20 MG TABS tablet TAKE 1 TABLET BY MOUTH ONCE DAILY WITH SUPPER 90 tablet 1   UNABLE TO FIND Take 1 tablet by mouth daily as needed. Med Name: alka seltzers     No current facility-administered medications for this visit.    SURGICAL HISTORY:  Past Surgical History:  Procedure Laterality Date   APPENDECTOMY  1957   BREAST EXCISIONAL BIOPSY Right 1966   CATARACT EXTRACTION, BILATERAL     CHOLECYSTECTOMY  10/2008   lump removed right breast  1958   bENIGN   right ankle plate  [id number]   TOTAL HIP ARTHROPLASTY Left 07/13/2023   Procedure:  TOTAL HIP ARTHROPLASTY ANTERIOR APPROACH;  Surgeon: Samson Frederic, MD;  Location: [facility];  Service: Orthopedics;  Laterality: Left;   VAGINAL HYSTERECTOMY  1978    REVIEW OF SYSTEMS:  A comprehensive review of systems was negative except for: Constitutional: positive for fatigue   PHYSICAL EXAMINATION: General appearance: alert, cooperative, and no distress Head: Normocephalic, without obvious abnormality, atraumatic Neck: no adenopathy, no JVD, supple, symmetrical, trachea midline, and thyroid not enlarged, symmetric, no tenderness/mass/nodules Lymph nodes: Cervical, supraclavicular, and axillary nodes normal. Resp: clear to auscultation bilaterally Back: symmetric, no curvature. ROM normal. No CVA tenderness. Cardio: regular rate and rhythm, S1, S2 normal, no murmur, click, rub or gallop GI: soft, non-tender; bowel sounds normal; no masses,  no organomegaly Extremities: extremities normal, atraumatic, no cyanosis or edema  ECOG  PERFORMANCE STATUS: 1 - Symptomatic but completely ambulatory  Blood pressure (!) 158/97, pulse (!) 102, temperature 98.3 F (36.8 C), temperature source Oral, resp. rate 18, SpO2 99%.  LABORATORY DATA: Lab Results  Component Value Date   WBC 10.0 08/29/2023   HGB 10.1 (L) 08/29/2023   HCT 32.0 (L) 08/29/2023   MCV 89.9 08/29/2023   PLT 318 08/29/2023      Chemistry      Component Value Date/Time   NA 139 08/25/2023 1955   NA 139 02/26/2021 1100   K 4.2 08/25/2023 1955   CL 101 08/25/2023 1955   CO2 23 08/25/2023 1955   BUN 9 08/25/2023 1955   BUN 12 02/26/2021 1100   CREATININE 0.75 08/25/2023 1955   CREATININE 0.74 05/02/2023 1549      Component Value Date/Time   CALCIUM 9.3 08/25/2023 1955   ALKPHOS 66 04/27/2021 1112   AST 13 05/02/2023 1549   AST 28 04/27/2021 1112   ALT 15 05/02/2023 1549   ALT 27 04/27/2021 1112   BILITOT 0.5 05/02/2023 1549   BILITOT 0.7 04/27/2021 1112       RADIOGRAPHIC STUDIES: CT HEAD WO  CONTRAST  Result Date: 08/25/2023 CLINICAL DATA:  Trauma/fall EXAM: CT HEAD WITHOUT CONTRAST TECHNIQUE: Contiguous axial images were obtained from the base of the skull through the vertex without intravenous contrast. RADIATION DOSE REDUCTION: This exam was performed according to the departmental dose-optimization program which includes automated exposure control, adjustment of the mA and/or kV according to patient size and/or use of iterative reconstruction technique. COMPARISON:  None Available. FINDINGS: Brain: No evidence of acute infarction, hemorrhage, hydrocephalus, extra-axial collection or mass lesion/mass effect. Subcortical white matter and periventricular small vessel ischemic changes. Global cortical atrophy. Vascular: Intracranial atherosclerosis. Skull: Normal. Negative for fracture or focal lesion. Sinuses/Orbits: The visualized paranasal sinuses are essentially clear. The mastoid air cells are unopacified. Other: None. IMPRESSION: No acute intracranial abnormality. Atrophy with small vessel ischemic changes. Electronically Signed   By: Charline Bills M.D.   On: 08/25/2023 21:09   DG Chest Port 1 View  Result Date: 08/25/2023 CLINICAL DATA:  Trauma/fall EXAM: PORTABLE CHEST 1 VIEW COMPARISON:  11/10/2008 FINDINGS: Lungs are clear.  No pleural effusion or pneumothorax. Cardiomegaly.  Thoracic aortic atherosclerosis. IMPRESSION: No acute cardiopulmonary disease. Cardiomegaly. Electronically Signed   By: Charline Bills M.D.   On: 08/25/2023 21:05   DG Pelvis Portable  Result Date: 08/25/2023 CLINICAL DATA:  Trauma/fall EXAM: PORTABLE PELVIS 1-2 VIEWS COMPARISON:  None Available. FINDINGS: No fracture or dislocation is seen. Left hip arthroplasty, without evidence of complication. IMPRESSION: No fracture or dislocation is seen. Left hip arthroplasty, without evidence of complication. Electronically Signed   By: Charline Bills M.D.   On: 08/25/2023 21:04    ASSESSMENT AND PLAN: This  is a very pleasant 88 years old white female with history of microcytic anemia secondary to iron deficiency of unclear etiology but likely from gastrointestinal blood loss.  The patient was treated with Venofer 300 mg IV weekly for 3 weeks and tolerated her treatment well.  She was also found to have vitamin B12 deficiency and she received vitamin B12 injection weekly for 4 weeks.  She is currently on monthly vitamin B12 injection and tolerating it fairly well.  Iron studies showed low serum iron and I will arrange for the patient to receive iron infusion at the [facility]. Assessment and Plan    Anemia Hemoglobin dropped from 12.1 to 10.1 after a fall  with significant blood loss. Patient is on Xarelto for atrial fibrillation. Patient also had recent hip surgery with expected blood loss. Patient is on monthly B12 injections. Iron and ferritin levels pending. -If iron and ferritin levels are low, will call patient with plan. -If levels are normal, follow up in 6 months. -Transition routine care to primary care provider, Dr. Caralee Ates.  Atrial Fibrillation Patient's heart rate is elevated despite being on Toprol XL 50mg  daily. Patient is also on Xarelto for anticoagulation. -Refer to cardiologist, Dr. Uvaldo Rising, for possible adjustment of medication due to elevated heart rate.  Fall Patient had a fall resulting in a significant forehead laceration and blood loss. CT scan ruled out brain bleed. -Continue Xarelto as prescribed. -Encourage safety measures to prevent future falls.  Hip Replacement Patient had a hip replacement six weeks ago. Expected blood loss from surgery. -Continue current post-operative care plan.  Follow-up as needed. Call office if any concerning symptoms arise.    She was advised to call immediately if she has any concerning symptoms in the interval.  The patient voices understanding of current disease status and treatment options and is in agreement with the  current care plan.  All questions were answered. The patient knows to call the clinic with any problems, questions or concerns. We can certainly see the patient much sooner if necessary.   Disclaimer: This note was dictated with voice recognition software. Similar sounding words can inadvertently be transcribed and may not be corrected upon review.

## 2023-08-30 ENCOUNTER — Telehealth: Payer: Self-pay | Admitting: Cardiovascular Disease

## 2023-08-30 ENCOUNTER — Other Ambulatory Visit: Payer: Self-pay | Admitting: Internal Medicine

## 2023-08-30 ENCOUNTER — Ambulatory Visit: Payer: Medicare Other | Admitting: Physician Assistant

## 2023-08-30 DIAGNOSIS — E785 Hyperlipidemia, unspecified: Secondary | ICD-10-CM

## 2023-08-30 LAB — FERRITIN: Ferritin: 50 ng/mL (ref 11–307)

## 2023-08-30 NOTE — Telephone Encounter (Signed)
Patient c/o Palpitations:  High priority if patient c/o lightheadedness, shortness of breath, or chest pain  How long have you had palpitations/irregular HR/ Afib? Are you having the symptoms now?  Thursday night patient had a fall and when EMS arrived she was advised that she was in afib. She lost a lot of blood and she mentions she has anemia, so her iron and hemoglobin were low. Patient states she has not had any cardiac symptoms since then. See ED notes.  Are you currently experiencing lightheadedness, SOB or CP?  No   Do you have a history of afib (atrial fibrillation) or irregular heart rhythm?  Hx of afib   Have you checked your BP or HR? (document readings if available):  See ED notes.  Are you experiencing any other symptoms?  No, but patient mentions low iron + low hemoglobin

## 2023-08-31 NOTE — Telephone Encounter (Signed)
Pt had a fall on Nov 1 and hit her head and had some bleeding . Pt was seen in the ED. Pt states that when EMS arrived that states that she was in Afib. Pt states that she was seen by Dr. Arbutus Ped and told to see her cardiologist. Please advise.

## 2023-08-31 NOTE — Telephone Encounter (Signed)
Requested Prescriptions  Pending Prescriptions Disp Refills   atorvastatin (LIPITOR) 10 MG tablet [Pharmacy Med Name: Atorvastatin Calcium 10 MG Oral Tablet] 90 tablet 0    Sig: Take 1 tablet by mouth once daily     Cardiovascular:  Antilipid - Statins Failed - 08/30/2023 11:07 PM      Failed - Lipid Panel in normal range within the last 12 months    Cholesterol  Date Value Ref Range Status  11/30/2022 108 <200 mg/dL Final   LDL Cholesterol (Calc)  Date Value Ref Range Status  11/30/2022 44 mg/dL (calc) Final    Comment:    Reference range: <100 . Desirable range <100 mg/dL for primary prevention;   <70 mg/dL for patients with CHD or diabetic patients  with > or = 2 CHD risk factors. Marland Kitchen LDL-C is now calculated using the Martin-Hopkins  calculation, which is a validated novel method providing  better accuracy than the Friedewald equation in the  estimation of LDL-C.  Horald Pollen et al. Lenox Ahr. 7846;962(95): 2061-2068  (http://education.[employer].com/faq/FAQ164)    HDL  Date Value Ref Range Status  11/30/2022 38 (L) > OR = 50 mg/dL Final   Triglycerides  Date Value Ref Range Status  11/30/2022 192 (H) <150 mg/dL Final         Passed - Patient is not pregnant      Passed - Valid encounter within last 12 months    Recent Outpatient Visits           2 months ago Right flank pain   [place] [facility] Margarita Mail, DO   4 months ago Pre-op evaluation   [facility] Margarita Mail, DO   5 months ago Type 2 diabetes mellitus with diabetic polyneuropathy, with long-term current use of insulin [facility])   [facility] Margarita Mail, DO   9 months ago Diabetic peripheral neuropathy associated with type 2 diabetes mellitus [facility])   [place] [facility] Margarita Mail, DO   1 year ago Type 2 diabetes mellitus with hyperglycemia, with long-term current use of insulin [facility])    [place] [facility] Margarita Mail, DO       Future Appointments             Tomorrow Mecum, Oswaldo Conroy, PA-C [facility]   In 4 days Monge, Petra Kuba, NP [facility]   In 4 weeks  [facility]   In 1 month Margarita Mail, DO [facility]   In 4 months O'Neal, Ronnald Ramp, MD [facility]

## 2023-09-01 ENCOUNTER — Other Ambulatory Visit: Payer: Self-pay | Admitting: Physician Assistant

## 2023-09-01 ENCOUNTER — Other Ambulatory Visit: Payer: Self-pay | Admitting: Internal Medicine

## 2023-09-01 ENCOUNTER — Encounter: Payer: Self-pay | Admitting: Physician Assistant

## 2023-09-01 ENCOUNTER — Telehealth: Payer: Self-pay | Admitting: Pharmacy Technician

## 2023-09-01 ENCOUNTER — Telehealth: Payer: Self-pay

## 2023-09-01 ENCOUNTER — Ambulatory Visit (INDEPENDENT_AMBULATORY_CARE_PROVIDER_SITE_OTHER): Payer: Medicare Other | Admitting: Physician Assistant

## 2023-09-01 VITALS — BP 138/90 | HR 124 | Resp 16 | Ht 64.0 in | Wt 165.2 lb

## 2023-09-01 DIAGNOSIS — Z4802 Encounter for removal of sutures: Secondary | ICD-10-CM

## 2023-09-01 DIAGNOSIS — E785 Hyperlipidemia, unspecified: Secondary | ICD-10-CM

## 2023-09-01 DIAGNOSIS — D531 Other megaloblastic anemias, not elsewhere classified: Secondary | ICD-10-CM

## 2023-09-01 DIAGNOSIS — D500 Iron deficiency anemia secondary to blood loss (chronic): Secondary | ICD-10-CM | POA: Diagnosis not present

## 2023-09-01 NOTE — Telephone Encounter (Signed)
Patient identification verified by 2 forms. Danielle Rail, RN   Called and spoke to patient  Informed patient per Dr. Flora Lipps will need appointment for follow up/med adjustment  Patient states:  -she feels fine at this time  -has follow up PCP office today for stitch removal Advised patient to keep 11/11 appointment for follow up at 1:55pm with NP Monge  Patient verbalized understanding, no questions at this time

## 2023-09-01 NOTE — Telephone Encounter (Signed)
Thanks

## 2023-09-01 NOTE — Telephone Encounter (Signed)
Hello,  Patient will be scheduled as soon as possible.  Auth Submission: NO AUTH NEEDED Site of care: Site of care: [facility] Payer: medicare a/b, aarp supplement Medication & CPT/J Code(s) submitted: Venofer (Iron Sucrose) J1756 Route of submission (phone, fax, portal): portal Phone # Fax # Auth type: Buy/Bill HB Units/visits requested: 200mg , 5 doses Reference number:  Approval from: 09/01/23 to 10/24/23

## 2023-09-01 NOTE — Assessment & Plan Note (Signed)
Chronic, ongoing Was most recently seen by Dr. Arbutus Ped on 08/29/23  She would like to have iron transfusions for correction of anemia Will refer to Hematology services at [facility] per request Follow up in 3 months or sooner if concerns arise

## 2023-09-01 NOTE — Telephone Encounter (Signed)
Danielle [name]  Updated note:  Medicare does not cover Venofer with DX code D50.9.  Preferred medication is Feraheme. Would you like to use Feraheme??    Auth Submission: NO AUTH NEEDED Site of care: Site of care: [facility] Payer: medicare a/b & aarp supp Medication & CPT/J Code(s) submitted: Feraheme (ferumoxytol) [id number] Route of submission (phone, fax, portal):  Phone # Fax # Auth type: Buy/Bill HB Units/visits requested: 2 Reference number:  Approval from: 09/01/23 to 10/24/23

## 2023-09-01 NOTE — Addendum Note (Signed)
Addended by: Ihor Austin D on: 09/01/2023 10:34 AM   Modules accepted: Orders

## 2023-09-01 NOTE — Progress Notes (Signed)
Acute Office Visit   Patient: GERRY KIPPS   DOB: Nov 17, 1934   88 y.o. Female  MRN: [id number] Visit Date: 09/01/2023  Today's healthcare provider: Oswaldo Conroy Umaima Scholten, PA-C  Introduced myself to the patient as a Secondary school teacher and provided education on APPs in clinical practice.    Chief Complaint  Patient presents with   Suture / Staple Removal   Subjective    HPI   Removal of sutures Reviewed ED visit notes from 08/25/23  LILIUM WHELLER is a 88 y.o. female who obtained a laceration 7 days ago, which required closure with 2 suture. Mechanism of injury: Fall. She denies pain, redness, or drainage from the wound. Her last tetanus was 11 years ago.  She has concerns for iron deficiency and would like to discuss transfusions for this  Discussed referral process to Hematology for ongoing management  She is also concerned for bruising along her right leg from fall    Medications: Outpatient Medications Prior to Visit  Medication Sig   Accu-Chek Softclix Lancets lancets Use as instructed   atorvastatin (LIPITOR) 10 MG tablet Take 1 tablet by mouth once daily   Blood Glucose Monitoring Suppl (ACCU-CHEK GUIDE) w/Device KIT DxE11.65 Check BG tid   calcium carbonate (OSCAL) 1500 (600 Ca) MG TABS tablet Take 600 mg of elemental calcium by mouth 2 (two) times daily with a meal.   Cholecalciferol (VITAMIN D3) 1000 UNITS CAPS Take 1,000 Units by mouth in the morning and at bedtime.   colchicine 0.6 MG tablet Take 1 tablet by mouth once daily (Patient taking differently: Take 0.6 mg by mouth daily as needed (gout flares).)   cyanocobalamin (,VITAMIN B-12,) 1000 MCG/ML injection Inject 1,000 mcg into the muscle every 30 (thirty) days.   diltiazem (DILT-XR) 120 MG 24 hr capsule Take 1 capsule by mouth once daily   gabapentin (NEURONTIN) 300 MG capsule TAKE 3 CAPSULES BY MOUTH AT BEDTIME   glucose blood (ACCU-CHEK GUIDE) test strip USE AS DIRECTED THREE TIMES DAILY   HYDROcodone-acetaminophen  (NORCO/VICODIN) 5-325 MG tablet Take 1 tablet by mouth every 4 (four) hours as needed.   Insulin Pen Needle (PEN NEEDLES) 31G X 8 MM MISC 1 each by Does not apply route 2 (two) times daily.   Insulin Syringe-Needle U-100 (B-D INS SYR ULTRAFINE .5CC/30G) 30G X 1/2" 0.5 ML MISC 1 each by Does not apply route daily.   LANTUS 100 UNIT/ML injection INJECT 24 TO 26 UNITS SUBCUTANEOUSLY AT BEDTIME (Patient taking differently: Inject 22-26 Units into the skin at bedtime.)   metFORMIN (GLUCOPHAGE) 1000 MG tablet Take 1 tablet (1,000 mg total) by mouth 2 (two) times daily.   metoprolol succinate (TOPROL-XL) 50 MG 24 hr tablet TAKE 1 TABLET BY MOUTH TWICE DAILY (MORNING  AT  AT  BEDTIME)  WITH  OR  IMMEDIATELY  FOLLOWING  A  MEAL (Patient taking differently: Take 50 mg by mouth See admin instructions. Take 1 tablet by mouth twice a day (morning and bedtime) with or immediately following a meal)   omeprazole (PRILOSEC) 40 MG capsule Take 1 capsule (40 mg total) by mouth daily. (Patient taking differently: Take 40 mg by mouth daily as needed.)   ondansetron (ZOFRAN) 4 MG tablet Take 1 tablet (4 mg total) by mouth every 8 (eight) hours as needed for nausea or vomiting.   Polyethyl Glycol-Propyl Glycol (SYSTANE) 0.4-0.3 % SOLN Place 1-2 drops into both eyes 3 (three) times daily as needed (dry/irritated eyes).  rivaroxaban (XARELTO) 20 MG TABS tablet TAKE 1 TABLET BY MOUTH ONCE DAILY WITH SUPPER   UNABLE TO FIND Take 1 tablet by mouth daily as needed. Med Name: alka seltzers   No facility-administered medications prior to visit.    Review of Systems      Objective    BP (!) 138/90   Pulse (!) 124   Resp 16   Ht 5\' 4"  (1.626 m)   Wt 165 lb 3.2 oz (74.9 kg)   SpO2 99%   BMI 28.36 kg/m     Physical Exam Constitutional:      General: She is awake.     Appearance: Normal appearance. She is well-developed and well-groomed.  HENT:     Head: Normocephalic. Contusion and laceration present.      Comments: Injury exam:  A 1 cm laceration noted on the forehead above right eyebrow  is healing well, without evidence of infection.     Pulmonary:     Effort: Pulmonary effort is normal.  Musculoskeletal:     Cervical back: Normal range of motion.  Skin:    General: Skin is warm and dry.     Findings: Bruising present.          Comments: She has dark purple bruising present along medial aspect of upper thigh that radiated down behind her knee Mild swelling in the upper thigh compared to left but area is supple and no increased warmth or swelling distally   Neurological:     General: No focal deficit present.     Mental Status: She is alert and oriented to person, place, and time. Mental status is at baseline.  Psychiatric:        Mood and Affect: Mood normal.        Behavior: Behavior normal. Behavior is cooperative.        Thought Content: Thought content normal.        Judgment: Judgment normal.       No results found for any visits on 09/01/23.  Assessment & Plan      Return if symptoms worsen or fail to improve.       Problem List Items Addressed This Visit       Other   Iron deficiency anemia due to chronic blood loss    Chronic, ongoing Was most recently seen by Dr. Arbutus Ped on 08/29/23  She would like to have iron transfusions for correction of anemia Will refer to Hematology services at [facility] per request Follow up in 3 months or sooner if concerns arise        Relevant Orders   Ambulatory referral to Hematology / Oncology   Vitamin B12 deficient megaloblastic anemia   Relevant Orders   Ambulatory referral to Hematology / Oncology   Other Visit Diagnoses     Visit for suture removal    -  Primary   Relevant Orders   Suture removal kit     1. 2 sutures were removed. 2. Wound care discussed. 3. Follow up as needed.    Return if symptoms worsen or fail to improve.   I, Sheenah Dimitroff E Panayiota Larkin, PA-C, have reviewed all documentation for this visit. The  documentation on 09/01/23 for the exam, diagnosis, procedures, and orders are all accurate and complete.   Jacquelin Hawking, MHS, PA-C [facility]

## 2023-09-04 ENCOUNTER — Ambulatory Visit: Payer: Medicare Other | Attending: Nurse Practitioner | Admitting: Nurse Practitioner

## 2023-09-04 ENCOUNTER — Encounter: Payer: Self-pay | Admitting: Nurse Practitioner

## 2023-09-04 VITALS — BP 130/64 | HR 101 | Ht 64.0 in | Wt 166.8 lb

## 2023-09-04 DIAGNOSIS — Z794 Long term (current) use of insulin: Secondary | ICD-10-CM | POA: Insufficient documentation

## 2023-09-04 DIAGNOSIS — E118 Type 2 diabetes mellitus with unspecified complications: Secondary | ICD-10-CM | POA: Diagnosis not present

## 2023-09-04 DIAGNOSIS — E785 Hyperlipidemia, unspecified: Secondary | ICD-10-CM | POA: Diagnosis not present

## 2023-09-04 DIAGNOSIS — I10 Essential (primary) hypertension: Secondary | ICD-10-CM | POA: Diagnosis not present

## 2023-09-04 DIAGNOSIS — D500 Iron deficiency anemia secondary to blood loss (chronic): Secondary | ICD-10-CM | POA: Insufficient documentation

## 2023-09-04 DIAGNOSIS — I4821 Permanent atrial fibrillation: Secondary | ICD-10-CM | POA: Insufficient documentation

## 2023-09-04 NOTE — Patient Instructions (Signed)
Medication Instructions:  Your physician recommends that you continue on your current medications as directed. Please refer to the Current Medication list given to you today.  *If you need a refill on your cardiac medications before your next appointment, please call your pharmacy*   Lab Work: NONE ordered at this time of appointment    Testing/Procedures: NONE ordered at this time of appointment     Follow-Up: At [facility], you and your health needs are our priority.  As part of our continuing mission to provide you with exceptional heart care, we have created designated Provider Care Teams.  These Care Teams include your primary Cardiologist (physician) and Advanced Practice Providers (APPs -  Physician Assistants and Nurse Practitioners) who all work together to provide you with the care you need, when you need it.  We recommend signing up for the patient portal called "MyChart".  Sign up information is provided on this After Visit Summary.  MyChart is used to connect with patients for Virtual Visits (Telemedicine).  Patients are able to view lab/test results, encounter notes, upcoming appointments, etc.  Non-urgent messages can be sent to your provider as well.   To learn more about what you can do with MyChart, go to [email or web address].    Your next appointment:    Keep follow in March 2025  Provider:   Reatha Harps, MD     Other Instructions Monitor blood pressure & heart rate. Heart rate should be less than 110 beats per min at rest. Report blood pressure consistently greater than 140/90.

## 2023-09-04 NOTE — Telephone Encounter (Signed)
Requested Prescriptions  Refused Prescriptions Disp Refills   atorvastatin (LIPITOR) 10 MG tablet [Pharmacy Med Name: Atorvastatin Calcium 10 MG Oral Tablet] 90 tablet 0    Sig: Take 1 tablet by mouth once daily     Cardiovascular:  Antilipid - Statins Failed - 09/01/2023 11:59 AM      Failed - Lipid Panel in normal range within the last 12 months    Cholesterol  Date Value Ref Range Status  11/30/2022 108 <200 mg/dL Final   LDL Cholesterol (Calc)  Date Value Ref Range Status  11/30/2022 44 mg/dL (calc) Final    Comment:    Reference range: <100 . Desirable range <100 mg/dL for primary prevention;   <70 mg/dL for patients with CHD or diabetic patients  with > or = 2 CHD risk factors. Marland Kitchen LDL-C is now calculated using the Martin-Hopkins  calculation, which is a validated novel method providing  better accuracy than the Friedewald equation in the  estimation of LDL-C.  Horald Pollen et al. Lenox Ahr. 3329;518(84): 2061-2068  (http://education.[employer].com/faq/FAQ164)    HDL  Date Value Ref Range Status  11/30/2022 38 (L) > OR = 50 mg/dL Final   Triglycerides  Date Value Ref Range Status  11/30/2022 192 (H) <150 mg/dL Final         Passed - Patient is not pregnant      Passed - Valid encounter within last 12 months    Recent Outpatient Visits           3 days ago Visit for suture removal   [place] [facility] Mecum, Oswaldo Conroy, PA-C   3 months ago Right flank pain   [facility] Margarita Mail, DO   4 months ago Pre-op evaluation   [place] [facility] Margarita Mail, DO   6 months ago Type 2 diabetes mellitus with diabetic polyneuropathy, with long-term current use of insulin [facility])   [place] [facility] Margarita Mail, DO   9 months ago Diabetic peripheral neuropathy associated with type 2 diabetes mellitus [facility])   [place] [facility] Margarita Mail, DO        Future Appointments             Today Monge, Petra Kuba, NP [place] [facility]   In 3 weeks  [facility], [facility]   In 4 weeks Margarita Mail, DO [facility], [facility]   In 4 months O'Neal, Ronnald Ramp, MD [facility]

## 2023-09-04 NOTE — Progress Notes (Addendum)
Office Visit    Patient Name: Danielle [name] Date of Encounter: 09/04/2023  Primary Care Provider:  Margarita Mail, DO Primary Cardiologist:  Reatha Harps, MD  Chief Complaint    88 year old female with a history of permanent atrial fibrillation, RBBB/LAFB, hypertension, hyperlipidemia, type 2 diabetes, iron deficiency anemia, and B12 deficiency who presents for hospital follow-up related to atrial fibrillation.  Past Medical History    Past Medical History:  Diagnosis Date   Anemia    Anxiety    Arrhythmia    Arthritis    Atrial fibrillation (HCC)    Cataract    bilateral   Colon polyps    Diabetes mellitus without complication (HCC)    type two   Dyspnea    GERD (gastroesophageal reflux disease)    Hypercholesteremia    Hypertension    Iron deficiency anemia    Pancreatitis    Peripheral neuropathy    Refusal of blood transfusions as patient is Jehovah's Witness    Squamous cell skin cancer    Past Surgical History:  Procedure Laterality Date   APPENDECTOMY  1957   BREAST EXCISIONAL BIOPSY Right 1966   CATARACT EXTRACTION, BILATERAL     CHOLECYSTECTOMY  10/2008   lump removed right breast  1958   bENIGN   right ankle plate  4098   TOTAL HIP ARTHROPLASTY Left 07/13/2023   Procedure: TOTAL HIP ARTHROPLASTY ANTERIOR APPROACH;  Surgeon: Samson Frederic, MD;  Location: [facility];  Service: Orthopedics;  Laterality: Left;   VAGINAL HYSTERECTOMY  1978    Allergies  Allergies  Allergen Reactions   Amoxicillin Swelling and Rash    Throat swells Tolerated Cephalosporin Date: 07/13/2023.     Hydrochlorothiazide Other (See Comments)    HA and Leg cramps   Shellfish Allergy    Shrimp (Diagnostic) Other (See Comments)    Gout flares   Cleocin [Clindamycin Hcl] Rash   Codeine Rash    And edema   Penicillins Rash    Tolerated Cephalosporin Date: 07/13/2023.       Labs/Other Studies Reviewed    The following studies were reviewed today:  Cardiac  Studies & Procedures       ECHOCARDIOGRAM  ECHOCARDIOGRAM COMPLETE 03/23/2021  Narrative ECHOCARDIOGRAM REPORT    Patient Name:   Danielle [name] Date of Exam: 03/23/2021 Medical Rec #:  [id number]      Height:       63.0 in Accession #:    [id number]     Weight:       159.0 lb Date of Birth:  January 08, 1935       BSA:          1.754 m Patient Age:    85 years       BP:           104/68 mmHg Patient Gender: F              HR:           120 bpm. Exam Location:  [address]  Procedure: 2D Echo, Cardiac Doppler and Color Doppler  Indications:    I48.19 Atrial Fibrillation  History:        Patient has no prior history of Echocardiogram examinations. Arrythmias:Atrial Fibrillation, Signs/Symptoms:Shortness of Breath; Risk Factors:Family History of Coronary Artery Disease, Hypertension, Diabetes and Dyslipidemia.  Sonographer:    Farrel Conners RDCS Referring Phys: [id number] Ronnald Ramp O'NEAL  IMPRESSIONS   1. Left ventricular ejection fraction, by estimation, is 55 to 60%.  The left ventricle has normal function. The left ventricle has no regional wall motion abnormalities. There is mild left ventricular hypertrophy. Left ventricular diastolic function could not be evaluated. 2. Right ventricular systolic function is normal. The right ventricular size is normal. There is moderately elevated pulmonary artery systolic pressure. The estimated right ventricular systolic pressure is 45.2 mmHg. 3. Left atrial size was severely dilated. 4. The mitral valve is abnormal. Trivial mitral valve regurgitation. 5. Tricuspid valve regurgitation is mild to moderate. 6. The aortic valve is tricuspid. Aortic valve regurgitation is not visualized. Mild to moderate aortic valve sclerosis/calcification is present, without any evidence of aortic stenosis. 7. The inferior vena cava is normal in size with <50% respiratory variability, suggesting right atrial pressure of 8 mmHg.  Comparison(s): No prior  Echocardiogram.  FINDINGS Left Ventricle: Left ventricular ejection fraction, by estimation, is 55 to 60%. The left ventricle has normal function. The left ventricle has no regional wall motion abnormalities. The left ventricular internal cavity size was normal in size. There is mild left ventricular hypertrophy. Left ventricular diastolic function could not be evaluated due to atrial fibrillation. Left ventricular diastolic function could not be evaluated.  Right Ventricle: The right ventricular size is normal. No increase in right ventricular wall thickness. Right ventricular systolic function is normal. There is moderately elevated pulmonary artery systolic pressure. The tricuspid regurgitant velocity is 3.05 m/s, and with an assumed right atrial pressure of 8 mmHg, the estimated right ventricular systolic pressure is 45.2 mmHg.  Left Atrium: Left atrial size was severely dilated.  Right Atrium: Right atrial size was normal in size.  Pericardium: There is no evidence of pericardial effusion.  Mitral Valve: The mitral valve is abnormal. There is moderate calcification of the posterior mitral valve leaflet(s). Trivial mitral valve regurgitation. MV peak gradient, 13.5 mmHg. The mean mitral valve gradient is 4.0 mmHg.  Tricuspid Valve: The tricuspid valve is grossly normal. Tricuspid valve regurgitation is mild to moderate.  Aortic Valve: The aortic valve is tricuspid. Aortic valve regurgitation is not visualized. Mild to moderate aortic valve sclerosis/calcification is present, without any evidence of aortic stenosis.  Pulmonic Valve: The pulmonic valve was normal in structure. Pulmonic valve regurgitation is trivial.  Aorta: The aortic root and ascending aorta are structurally normal, with no evidence of dilitation.  Venous: The inferior vena cava is normal in size with less than 50% respiratory variability, suggesting right atrial pressure of 8 mmHg.  IAS/Shunts: No atrial level shunt  detected by color flow Doppler.   LEFT VENTRICLE PLAX 2D LVIDd:         4.10 cm  Diastology LVIDs:         2.90 cm  LV e' medial:    6.53 cm/s LV PW:         1.20 cm  LV E/e' medial:  26.6 LV IVS:        0.80 cm  LV e' lateral:   9.46 cm/s LVOT diam:     2.30 cm  LV E/e' lateral: 18.4 LV SV:         59 LV SV Index:   33 LVOT Area:     4.15 cm   RIGHT VENTRICLE RV S prime:     12.40 cm/s TAPSE (M-mode): 1.5 cm  LEFT ATRIUM             Index       RIGHT ATRIUM           Index LA diam:  3.50 cm 2.00 cm/m  RA Area:     18.70 cm LA Vol (A2C):   82.0 ml 46.75 ml/m RA Volume:   46.20 ml  26.34 ml/m LA Vol (A4C):   97.1 ml 55.36 ml/m LA Biplane Vol: 92.7 ml 52.85 ml/m AORTIC VALVE LVOT Vmax:   76.30 cm/s LVOT Vmean:  48.200 cm/s LVOT VTI:    0.141 m  AORTA Ao Root diam: 3.50 cm Ao Asc diam:  3.40 cm  MITRAL VALVE                TRICUSPID VALVE MV Area (PHT): cm          TR Peak grad:   37.2 mmHg MV Area VTI:   1.80 cm     TR Vmax:        305.00 cm/s MV Peak grad:  13.5 mmHg MV Mean grad:  4.0 mmHg     SHUNTS MV Vmax:       1.84 m/s     Systemic VTI:  0.14 m MV Vmean:      81.6 cm/s    Systemic Diam: 2.30 cm MV Decel Time: 222 msec MV E velocity: 174.00 cm/s  Zoila Shutter MD Electronically signed by Zoila Shutter MD Signature Date/Time: 03/23/2021/4:07:23 PM    Final            Recent Labs: 05/02/2023: ALT 15 08/25/2023: BUN 9; Creatinine, Ser 0.75; Potassium 4.2; Sodium 139 08/29/2023: Hemoglobin 10.1; Platelet Count 318  Recent Lipid Panel    Component Value Date/Time   CHOL 108 11/30/2022 0951   TRIG 192 (H) 11/30/2022 0951   HDL 38 (L) 11/30/2022 0951   CHOLHDL 2.8 11/30/2022 0951   VLDL 45 (H) 11/10/2008 0317   LDLCALC 44 11/30/2022 0951    History of Present Illness    88 year old female with the above past medical history including permanent atrial fibrillation, RBBB/LAFB, hypertension, hyperlipidemia, type 2 diabetes, iron deficiency  anemia, and B12 deficiency.  She has a history of permanent atrial fibrillation, rate controlled on metoprolol and diltiazem, goal HR less than 110.  She is on chronic anticoagulation with Xarelto.  Echocardiogram in 2022 showed EF 55 to 60%, normal LV function, no RWMA, mild LVH, normal RV, moderately elevated PASP, mild to moderate tricuspid valve regurgitation, mild to moderate aortic valve sclerosis without evidence of aortic stenosis.  She was last seen in the office on 12/28/2022 and was doing well from a cardiac standpoint.  She underwent total left hip arthroplasty in 06/2023.  She was evaluated in the ED on 08/25/2023 after mechanical fall during which she hit her head. CT of the head was negative for acute abnormality.  She saw hematology on 08/29/2023 and was noted to have an elevated heart rate.  Follow-up with cardiology was advised.  She presents today for follow-up.  Since her last visit and since her recent ED visit she has been stable from a cardiac standpoint.  She notes mild dyspnea on exertion, denies chest pain, palpitations, dizziness, edema, PND, orthopnea, weight gain.  Home Medications    Current Outpatient Medications  Medication Sig Dispense Refill   Accu-Chek Softclix Lancets lancets Use as instructed 100 each 12   atorvastatin (LIPITOR) 10 MG tablet Take 1 tablet by mouth once daily 90 tablet 0   Blood Glucose Monitoring Suppl (ACCU-CHEK GUIDE) w/Device KIT DxE11.65 Check BG tid 1 kit 0   calcium carbonate (OSCAL) 1500 (600 Ca) MG TABS tablet Take 600 mg of elemental calcium by mouth  2 (two) times daily with a meal.     Cholecalciferol (VITAMIN D3) 1000 UNITS CAPS Take 1,000 Units by mouth in the morning and at bedtime.     colchicine 0.6 MG tablet Take 1 tablet by mouth once daily (Patient taking differently: Take 0.6 mg by mouth daily as needed (gout flares).) 20 tablet 0   cyanocobalamin (,VITAMIN B-12,) 1000 MCG/ML injection Inject 1,000 mcg into the muscle every 30  (thirty) days.     diltiazem (DILT-XR) 120 MG 24 hr capsule Take 1 capsule by mouth once daily 90 capsule 1   gabapentin (NEURONTIN) 300 MG capsule TAKE 3 CAPSULES BY MOUTH AT BEDTIME 270 capsule 3   glucose blood (ACCU-CHEK GUIDE) test strip USE AS DIRECTED THREE TIMES DAILY 300 each 0   Insulin Pen Needle (PEN NEEDLES) 31G X 8 MM MISC 1 each by Does not apply route 2 (two) times daily. 100 each 3   Insulin Syringe-Needle U-100 (B-D INS SYR ULTRAFINE .5CC/30G) 30G X 1/2" 0.5 ML MISC 1 each by Does not apply route daily. 100 each 3   LANTUS 100 UNIT/ML injection INJECT 24 TO 26 UNITS SUBCUTANEOUSLY AT BEDTIME (Patient taking differently: Inject 22-26 Units into the skin at bedtime.) 10 mL 1   metFORMIN (GLUCOPHAGE) 1000 MG tablet Take 1 tablet (1,000 mg total) by mouth 2 (two) times daily. 180 tablet 1   metoprolol succinate (TOPROL-XL) 50 MG 24 hr tablet TAKE 1 TABLET BY MOUTH TWICE DAILY (MORNING  AT  AT  BEDTIME)  WITH  OR  IMMEDIATELY  FOLLOWING  A  MEAL (Patient taking differently: Take 50 mg by mouth See admin instructions. Take 1 tablet by mouth twice a day (morning and bedtime) with or immediately following a meal) 180 tablet 3   omeprazole (PRILOSEC) 40 MG capsule Take 1 capsule (40 mg total) by mouth daily. (Patient taking differently: Take 40 mg by mouth daily as needed.) 90 capsule 1   ondansetron (ZOFRAN) 4 MG tablet Take 1 tablet (4 mg total) by mouth every 8 (eight) hours as needed for nausea or vomiting. 30 tablet 0   Polyethyl Glycol-Propyl Glycol (SYSTANE) 0.4-0.3 % SOLN Place 1-2 drops into both eyes 3 (three) times daily as needed (dry/irritated eyes).     rivaroxaban (XARELTO) 20 MG TABS tablet TAKE 1 TABLET BY MOUTH ONCE DAILY WITH SUPPER 90 tablet 1   UNABLE TO FIND Take 1 tablet by mouth daily as needed. Med Name: alka seltzers     HYDROcodone-acetaminophen (NORCO/VICODIN) 5-325 MG tablet Take 1 tablet by mouth every 4 (four) hours as needed. (Patient not taking: Reported on  09/04/2023)     No current facility-administered medications for this visit.     Review of Systems    She denies chest pain, palpitations, pnd, orthopnea, n, v, dizziness, syncope, edema, weight gain, or early satiety. All other systems reviewed and are otherwise negative except as noted above.   Physical Exam    VS:  BP 130/64 (BP Location: Left Arm, Patient Position: Sitting, Cuff Size: Normal)   Pulse (!) 101   Ht 5\' 4"  (1.626 m)   Wt 166 lb 12.8 oz (75.7 kg)   SpO2 99%   BMI 28.63 kg/m   GEN: Well nourished, well developed, in no acute distress. HEENT: normal. Neck: Supple, no JVD, carotid bruits, or masses. Cardiac: IRIR, no murmurs, rubs, or gallops. No clubbing, cyanosis, edema.  Radials/DP/PT 2+ and equal bilaterally.  Respiratory:  Respirations regular and unlabored, clear to auscultation bilaterally.  GI: Soft, nontender, nondistended, BS + x 4. MS: no deformity or atrophy. Skin: warm and dry, no rash. Neuro:  Strength and sensation are intact. Psych: Normal affect.  Accessory Clinical Findings    ECG personally reviewed by me today - EKG Interpretation Date/Time:  Monday September 04 2023 14:15:14 EST Ventricular Rate:  101 PR Interval:    QRS Duration:  130 QT Interval:  318 QTC Calculation: 412 R Axis:   -21  Text Interpretation: Atrial fibrillation with rapid ventricular response Right bundle branch block No previous ECGs available Confirmed by Bernadene Person ([id number]) on 09/04/2023 2:16:07 PM  - no acute changes.   Lab Results  Component Value Date   WBC 10.0 08/29/2023   HGB 10.1 (L) 08/29/2023   HCT 32.0 (L) 08/29/2023   MCV 89.9 08/29/2023   PLT 318 08/29/2023   Lab Results  Component Value Date   CREATININE 0.75 08/25/2023   BUN 9 08/25/2023   NA 139 08/25/2023   K 4.2 08/25/2023   CL 101 08/25/2023   CO2 23 08/25/2023   Lab Results  Component Value Date   ALT 15 05/02/2023   AST 13 05/02/2023   ALKPHOS 66 04/27/2021   BILITOT 0.5  05/02/2023   Lab Results  Component Value Date   CHOL 108 11/30/2022   HDL 38 (L) 11/30/2022   LDLCALC 44 11/30/2022   TRIG 192 (H) 11/30/2022   CHOLHDL 2.8 11/30/2022    Lab Results  Component Value Date   HGBA1C 7.3 (H) 06/30/2023    Assessment & Plan   1. Permanent atrial fibrillation: Recent elevated heart rate.  It appears that her heart rate has been just slightly above 100 bpm.  She notes mild dyspnea with exertion, euvolemic and well compensated on exam.  Per Dr. Flora Lipps, goal resting heart rate less than 110 bpm.  Continue to monitor, if HR remains elevated above goal, consider increasing diltiazem vs. Metoprolol, will defer for now.  Continue diltiazem, metoprolol, Xarelto.  2. Hypertension: BP well controlled.  Continue to monitor BP and report BP consistently greater than 140/90.  Continue current antihypertensive regimen.   3. Hyperlipidemia: LDL was 44 in 11/2022. Continue Lipitor.   4. Type 2 diabetes: A1c was 7.3 in 06/2023.  Managed per PCP.  5. Iron deficiency anemia: Hemoglobin was 10.1 in 08/2023.  She is about to start receiving iron infusions.  She has mild dyspnea on exertion as above, suspect anemia could be contributing to this.  Denies bleeding.  Continue to monitor.  Following with hematology.   6. Disposition: Follow-up as scheduled with Dr. Flora Lipps in 12/2023.      Joylene Grapes, NP 09/04/2023, 2:22 PM

## 2023-09-07 ENCOUNTER — Encounter: Payer: Self-pay | Admitting: Podiatry

## 2023-09-07 ENCOUNTER — Ambulatory Visit (INDEPENDENT_AMBULATORY_CARE_PROVIDER_SITE_OTHER): Payer: Medicare Other | Admitting: Podiatry

## 2023-09-07 ENCOUNTER — Telehealth: Payer: Self-pay

## 2023-09-07 VITALS — Ht 64.0 in | Wt 167.0 lb

## 2023-09-07 DIAGNOSIS — M79674 Pain in right toe(s): Secondary | ICD-10-CM

## 2023-09-07 DIAGNOSIS — L84 Corns and callosities: Secondary | ICD-10-CM | POA: Diagnosis not present

## 2023-09-07 DIAGNOSIS — B351 Tinea unguium: Secondary | ICD-10-CM

## 2023-09-07 DIAGNOSIS — E1142 Type 2 diabetes mellitus with diabetic polyneuropathy: Secondary | ICD-10-CM

## 2023-09-07 DIAGNOSIS — M79675 Pain in left toe(s): Secondary | ICD-10-CM

## 2023-09-07 NOTE — Progress Notes (Signed)
Subjective:  Patient ID: Danielle [name], female    DOB: 06/30/1935,  MRN: [id number]  Danielle [name] presents to clinic today for at risk foot care with history of diabetic neuropathy and preulcerative lesion(s) right foot and painful mycotic toenails that limit ambulation. Painful toenails interfere with ambulation. Aggravating factors include wearing enclosed shoe gear. Pain is relieved with periodic professional debridement. Painful preulcerative lesion(s) is/are aggravated when weightbearing with and without shoegear. Pain is relieved with periodic professional debridement.  Chief Complaint  Patient presents with   Nail Problem    Pt is here for [facility], last A1C was 7.4 and PCP is Dr Caralee Ates and LOV was earlier this week.   New problem(s): None.   PCP is Margarita Mail, DO.  Allergies  Allergen Reactions   Amoxicillin Swelling and Rash    Throat swells Tolerated Cephalosporin Date: 07/13/2023.     Hydrochlorothiazide Other (See Comments)    HA and Leg cramps   Shellfish Allergy    Shrimp (Diagnostic) Other (See Comments)    Gout flares   Cleocin [Clindamycin Hcl] Rash   Codeine Rash    And edema   Penicillins Rash    Tolerated Cephalosporin Date: 07/13/2023.      Review of Systems: Negative except as noted in the HPI.  Objective: No changes noted in today's physical examination. There were no vitals filed for this visit. Danielle [name] is a pleasant 88 y.o. female WD, WN in NAD. AAO x 3.  Vascular Examination: Capillary refill time immediate b/l. Vascular status intact b/l with palpable DP pulses; faintly palpable PT pulses. Pedal hair diminished b/l. No edema. No pain with calf compression b/l. Skin temperature gradient WNL b/l. No cyanosis or clubbing noted b/l. No ischemia or gangrene b/l. Spider veins b/l.  Neurological Examination: Sensation grossly intact b/l with 10 gram monofilament. Vibratory sensation intact b/l. Pt has subjective symptoms of  neuropathy.  Dermatological Examination: Pedal skin with normal turgor, texture and tone b/l.  No open wounds. No interdigital macerations.   Toenails 1-5 b/l thick, discolored, elongated with subungual debris and pain on dorsal palpation.   Hyperkeratotic lesion(s) submet head 1 left foot.  No erythema, no edema, no drainage, no fluctuance. Preulcerative lesion noted submet head 1 right foot. There is visible subdermal hemorrhage. There is no surrounding erythema, no edema, no drainage, no odor, no fluctuance.  Musculoskeletal Examination: Muscle strength 5/5 to all lower extremity muscle groups bilaterally. Hallux hammertoe noted b/l LE.  Radiographs: None  Last A1c:      Latest Ref Rng & Units 06/30/2023   11:19 AM 05/02/2023    3:23 PM 11/30/2022    9:51 AM  Hemoglobin A1C  Hemoglobin-A1c 4.8 - 5.6 % 7.3  7.6  8.0    Assessment/Plan: 1. Pain due to onychomycosis of toenails of both feet   2. Pre-ulcerative calluses   3. Diabetic peripheral neuropathy associated with type 2 diabetes mellitus (HCC)    -Consent given for treatment as described below: -Examined patient. -Continue foot and shoe inspections daily. Monitor blood glucose per PCP/Endocrinologist's recommendations. -Continue supportive shoe gear daily. -Toenails 1-5 bilaterally were debrided in length and girth with sterile nail nippers and dremel. Pinpoint bleeding of R 2nd toe addressed with Lumicain Hemostatic Solution, cleansed with alcohol. Triple antibiotic ointment applied. No further treatment required by patient/caregiver. -Callus(es) submet head 1 left foot pared utilizing sterile scalpel blade without complication or incident. Total number debrided =1. -Preulcerative lesion pared submet head 1 right foot utilizing  sterile scalpel blade. Total number pared=1. -Patient/POA to call should there be question/concern in the interim.   Return in about 3 months (around 12/08/2023).  Freddie Breech, DPM

## 2023-09-07 NOTE — Telephone Encounter (Signed)
Transition Care Management Follow-up Telephone Call Date of discharge and from where: Danielle [name] 11/1 How have you been since you were released from the hospital? Doing better and following up with providers Any questions or concerns? No  Items Reviewed: Did the pt receive and understand the discharge instructions provided? Yes  Medications obtained and verified? No  Other? No  Any new allergies since your discharge? No  Dietary orders reviewed? No Do you have support at home? No    Follow up appointments reviewed:  PCP Hospital f/u appt confirmed? Yes  Scheduled to see  on  @ . Specialist Hospital f/u appt confirmed? Yes  Scheduled to see  on  @ . Are transportation arrangements needed? No  If their condition worsens, is the pt aware to call PCP or go to the Emergency Dept.? Yes Was the patient provided with contact information for the PCP's office or ED? Yes Was to pt encouraged to call back with questions or concerns? Yes

## 2023-09-11 ENCOUNTER — Ambulatory Visit: Payer: Medicare Other

## 2023-09-11 VITALS — BP 159/65 | HR 79 | Temp 97.9°F | Resp 18 | Ht 64.0 in | Wt 165.2 lb

## 2023-09-11 DIAGNOSIS — D509 Iron deficiency anemia, unspecified: Secondary | ICD-10-CM | POA: Diagnosis not present

## 2023-09-11 DIAGNOSIS — D500 Iron deficiency anemia secondary to blood loss (chronic): Secondary | ICD-10-CM

## 2023-09-11 MED ORDER — DIPHENHYDRAMINE HCL 25 MG PO CAPS
25.0000 mg | ORAL_CAPSULE | Freq: Once | ORAL | Status: AC
  Administered 2023-09-11: 25 mg via ORAL
  Filled 2023-09-11: qty 1

## 2023-09-11 MED ORDER — ACETAMINOPHEN 325 MG PO TABS
650.0000 mg | ORAL_TABLET | Freq: Once | ORAL | Status: AC
  Administered 2023-09-11: 650 mg via ORAL
  Filled 2023-09-11: qty 2

## 2023-09-11 MED ORDER — SODIUM CHLORIDE 0.9 % IV SOLN
510.0000 mg | Freq: Once | INTRAVENOUS | Status: AC
  Administered 2023-09-11: 510 mg via INTRAVENOUS
  Filled 2023-09-11: qty 17

## 2023-09-11 NOTE — Progress Notes (Signed)
Diagnosis: Iron Deficiency Anemia  Provider:  Chilton Greathouse MD  Procedure: IV Infusion  IV Type: Peripheral, IV Location: R Antecubital  Feraheme (Ferumoxytol), Dose: 510 mg  Infusion Start Time: 1513  Infusion Stop Time: 1532  Post Infusion IV Care: Observation period completed and Peripheral IV Discontinued  Discharge: Condition: Good, Destination: Home . AVS Provided  Performed by:  Rico Ala, LPN

## 2023-09-15 ENCOUNTER — Ambulatory Visit: Payer: Medicare Other

## 2023-09-18 ENCOUNTER — Ambulatory Visit: Payer: Medicare Other

## 2023-09-18 ENCOUNTER — Ambulatory Visit (INDEPENDENT_AMBULATORY_CARE_PROVIDER_SITE_OTHER): Payer: Medicare Other

## 2023-09-18 DIAGNOSIS — E538 Deficiency of other specified B group vitamins: Secondary | ICD-10-CM | POA: Diagnosis not present

## 2023-09-18 MED ORDER — CYANOCOBALAMIN 1000 MCG/ML IJ SOLN
1000.0000 ug | Freq: Once | INTRAMUSCULAR | Status: AC
  Administered 2023-09-18: 1000 ug via INTRAMUSCULAR

## 2023-09-19 ENCOUNTER — Ambulatory Visit: Payer: Medicare Other | Admitting: *Deleted

## 2023-09-19 VITALS — BP 152/76 | HR 85 | Temp 98.2°F | Resp 16 | Ht 64.0 in | Wt 167.4 lb

## 2023-09-19 DIAGNOSIS — D500 Iron deficiency anemia secondary to blood loss (chronic): Secondary | ICD-10-CM

## 2023-09-19 DIAGNOSIS — D509 Iron deficiency anemia, unspecified: Secondary | ICD-10-CM

## 2023-09-19 MED ORDER — DIPHENHYDRAMINE HCL 25 MG PO CAPS: 25.0000 mg | ORAL_CAPSULE | Freq: Once | ORAL | Status: DC

## 2023-09-19 MED ORDER — ACETAMINOPHEN 325 MG PO TABS: 650.0000 mg | ORAL_TABLET | Freq: Once | ORAL | Status: DC

## 2023-09-19 MED ORDER — SODIUM CHLORIDE 0.9 % IV SOLN
510.0000 mg | Freq: Once | INTRAVENOUS | Status: AC
  Administered 2023-09-19: 510 mg via INTRAVENOUS
  Filled 2023-09-19: qty 17

## 2023-09-19 NOTE — Progress Notes (Signed)
Diagnosis: Iron Deficiency Anemia  Provider:  Chilton Greathouse MD  Procedure: IV Infusion  IV Type: Peripheral, IV Location: L Forearm  Feraheme (Ferumoxytol), Dose: 510 mg  Infusion Start Time: 1435 pm  Infusion Stop Time: 1455 pm  Post Infusion IV Care: Observation period completed and Peripheral IV Discontinued  Discharge: Condition: Good, Destination: Home . AVS Provided  Performed by:  Forrest Moron, RN

## 2023-09-19 NOTE — Addendum Note (Signed)
Addended by: Nat Math on: 09/19/2023 04:13 PM   Modules accepted: Orders

## 2023-09-21 ENCOUNTER — Other Ambulatory Visit: Payer: Self-pay | Admitting: Cardiovascular Disease

## 2023-09-21 DIAGNOSIS — I4821 Permanent atrial fibrillation: Secondary | ICD-10-CM

## 2023-09-25 NOTE — Telephone Encounter (Signed)
Prescription refill request for Xarelto received.  Indication: Afib  Last office visit: 09/04/23 ([facility])  Weight: 75.9kg Age: 88 Scr: 0.75 (08/25/23)  CrCl: 64.62ml/min  Appropriate dose. Refill sent.

## 2023-09-28 ENCOUNTER — Ambulatory Visit: Payer: Medicare Other

## 2023-09-28 DIAGNOSIS — Z0000 Encounter for general adult medical examination without abnormal findings: Secondary | ICD-10-CM | POA: Diagnosis not present

## 2023-09-28 NOTE — Progress Notes (Signed)
Subjective:   Danielle [name] is a 88 y.o. female who presents for Medicare Annual (Subsequent) preventive examination.  Visit Complete: Virtual I connected with  Danielle [name] on 09/28/23 by a audio enabled telemedicine application and verified that I am speaking with the correct person using two identifiers.  Patient Location: Home  Provider Location: Office/Clinic  I discussed the limitations of evaluation and management by telemedicine. The patient expressed understanding and agreed to proceed.  Vital Signs: Because this visit was a virtual/telehealth visit, some criteria may be missing or patient reported. Any vitals not documented were not able to be obtained and vitals that have been documented are patient reported.  Cardiac Risk Factors include: advanced age (>40men, >53 women);diabetes mellitus;dyslipidemia;hypertension;sedentary lifestyle     Objective:    Today's Vitals   09/28/23 1128  PainSc: 3    There is no height or weight on file to calculate BMI.     09/28/2023   11:37 AM 09/11/2023    2:42 PM 08/25/2023    8:16 PM 07/13/2023    6:00 PM 07/13/2023    9:55 AM 06/30/2023   11:24 AM 09/08/2022    3:09 PM  Advanced Directives  Does Patient Have a Medical Advance Directive? Yes Yes Yes Yes Yes Yes No  Type of Estate agent of [place];Living will Healthcare Power of [place];Living will Living will;Healthcare Power of [employer] Power of [place];Living will Healthcare Power of [place];Living will Healthcare Power of [place];Living will   Does patient want to make changes to medical advance directive? No - Patient declined  No - Patient declined No - Patient declined No - Patient declined    Copy of Healthcare Power of Attorney in Chart? Yes - validated most recent copy scanned in chart (See row information) Yes - validated most recent copy scanned in chart (See row information) No - copy requested Yes - validated most recent copy  scanned in chart (See row information) Yes - validated most recent copy scanned in chart (See row information) No - copy requested   Would patient like information on creating a medical advance directive?   No - Patient declined    No - Patient declined    Current Medications (verified) Outpatient Encounter Medications as of 09/28/2023  Medication Sig   Accu-Chek Softclix Lancets lancets Use as instructed   atorvastatin (LIPITOR) 10 MG tablet Take 1 tablet by mouth once daily   Blood Glucose Monitoring Suppl (ACCU-CHEK GUIDE) w/Device KIT DxE11.65 Check BG tid   calcium carbonate (OSCAL) 1500 (600 Ca) MG TABS tablet Take 600 mg of elemental calcium by mouth 2 (two) times daily with a meal.   Cholecalciferol (VITAMIN D3) 1000 UNITS CAPS Take 1,000 Units by mouth in the morning and at bedtime.   colchicine 0.6 MG tablet Take 1 tablet by mouth once daily (Patient taking differently: Take 0.6 mg by mouth daily as needed (gout flares).)   cyanocobalamin (,VITAMIN B-12,) 1000 MCG/ML injection Inject 1,000 mcg into the muscle every 30 (thirty) days.   diltiazem (DILT-XR) 120 MG 24 hr capsule Take 1 capsule by mouth once daily   gabapentin (NEURONTIN) 300 MG capsule TAKE 3 CAPSULES BY MOUTH AT BEDTIME   glucose blood (ACCU-CHEK GUIDE) test strip USE AS DIRECTED THREE TIMES DAILY   Insulin Pen Needle (PEN NEEDLES) 31G X 8 MM MISC 1 each by Does not apply route 2 (two) times daily.   Insulin Syringe-Needle U-100 (B-D INS SYR ULTRAFINE .5CC/30G) 30G X 1/2" 0.5  ML MISC 1 each by Does not apply route daily.   LANTUS 100 UNIT/ML injection INJECT 24 TO 26 UNITS SUBCUTANEOUSLY AT BEDTIME (Patient taking differently: Inject 22-26 Units into the skin at bedtime.)   metFORMIN (GLUCOPHAGE) 1000 MG tablet Take 1 tablet (1,000 mg total) by mouth 2 (two) times daily.   metoprolol succinate (TOPROL-XL) 50 MG 24 hr tablet TAKE 1 TABLET BY MOUTH TWICE DAILY (MORNING  AT  AT  BEDTIME)  WITH  OR  IMMEDIATELY  FOLLOWING  A   MEAL (Patient taking differently: Take 50 mg by mouth See admin instructions. Take 1 tablet by mouth twice a day (morning and bedtime) with or immediately following a meal)   omeprazole (PRILOSEC) 40 MG capsule Take 1 capsule (40 mg total) by mouth daily. (Patient taking differently: Take 40 mg by mouth daily as needed.)   Polyethyl Glycol-Propyl Glycol (SYSTANE) 0.4-0.3 % SOLN Place 1-2 drops into both eyes 3 (three) times daily as needed (dry/irritated eyes).   rivaroxaban (XARELTO) 20 MG TABS tablet TAKE 1 TABLET BY MOUTH ONCE DAILY WITH SUPPER   UNABLE TO FIND Take 1 tablet by mouth daily as needed. Med Name: alka seltzers   HYDROcodone-acetaminophen (NORCO/VICODIN) 5-325 MG tablet Take 1 tablet by mouth every 4 (four) hours as needed. (Patient not taking: Reported on 09/28/2023)   ondansetron (ZOFRAN) 4 MG tablet Take 1 tablet (4 mg total) by mouth every 8 (eight) hours as needed for nausea or vomiting. (Patient not taking: Reported on 09/28/2023)   No facility-administered encounter medications on file as of 09/28/2023.    Allergies (verified) Amoxicillin, Hydrochlorothiazide, Shellfish allergy, Shrimp (diagnostic), Cleocin [clindamycin hcl], Codeine, and Penicillins   History: Past Medical History:  Diagnosis Date   Anemia    Anxiety    Arrhythmia    Arthritis    Atrial fibrillation (HCC)    Cataract    bilateral   Colon polyps    Diabetes mellitus without complication (HCC)    type two   Dyspnea    GERD (gastroesophageal reflux disease)    Hypercholesteremia    Hypertension    Iron deficiency anemia    Pancreatitis    Peripheral neuropathy    Refusal of blood transfusions as patient is Jehovah's Witness    Squamous cell skin cancer    Past Surgical History:  Procedure Laterality Date   APPENDECTOMY  1957   BREAST EXCISIONAL BIOPSY Right 1966   CATARACT EXTRACTION, BILATERAL     CHOLECYSTECTOMY  10/2008   lump removed right breast  1958   bENIGN   right ankle plate   3244   TOTAL HIP ARTHROPLASTY Left 07/13/2023   Procedure: TOTAL HIP ARTHROPLASTY ANTERIOR APPROACH;  Surgeon: Samson Frederic, MD;  Location: [facility];  Service: Orthopedics;  Laterality: Left;   VAGINAL HYSTERECTOMY  1978   Family History  Problem Relation Age of Onset   Hypertension Mother    Stroke Mother    Diabetes Mother    Colon cancer Mother 20   Cerebral aneurysm Father    Colon cancer Sister        in her 16s   CAD Sister    Heart attack Sister    Uterine cancer Sister        in her late 68s   Colon cancer Sister        in her 75s   Parkinsonism Sister    Dementia Sister    Heart attack Brother    Heart attack Brother  Throat cancer Brother    Atrial fibrillation Brother    Breast cancer Neg Hx    Social History   Socioeconomic History   Marital status: Widowed    Spouse name: Not on file   Number of children: 3   Years of education: Not on file   Highest education level: Not on file  Occupational History   Occupation: Retired  Tobacco Use   Smoking status: Never   Smokeless tobacco: Never  Vaping Use   Vaping status: Never Used  Substance and Sexual Activity   Alcohol use: No    Alcohol/week: 0.0 standard drinks of alcohol   Drug use: No   Sexual activity: Not Currently  Other Topics Concern   Not on file  Social History Narrative   Not on file   Social Determinants of Health   Financial Resource Strain: Low Risk  (09/28/2023)   Overall Financial Resource Strain (CARDIA)    Difficulty of Paying Living Expenses: Not hard at all  Food Insecurity: No Food Insecurity (09/28/2023)   Hunger Vital Sign    Worried About Running Out of Food in the Last Year: Never true    Ran Out of Food in the Last Year: Never true  Transportation Needs: No Transportation Needs (09/28/2023)   PRAPARE - Administrator, Civil Service (Medical): No    Lack of Transportation (Non-Medical): No  Physical Activity: Insufficiently Active (09/28/2023)   Exercise  Vital Sign    Days of Exercise per Week: 3 days    Minutes of Exercise per Session: 20 min  Stress: No Stress Concern Present (09/28/2023)   [employer] of Occupational Health - Occupational Stress Questionnaire    [name] : Not at all  Social Connections: Moderately Integrated (09/28/2023)   Social Connection and Isolation Panel [NHANES]    Frequency of Communication with Friends and Family: More than three times a week    Frequency of Social Gatherings with Friends and Family: Three times a week    Attends Religious Services: More than 4 times per year    Active Member of Clubs or Organizations: Yes    Attends Banker Meetings: More than 4 times per year    Marital Status: Widowed    Tobacco Counseling Counseling given: Not Answered   Clinical Intake:  Pre-visit preparation completed: Yes  Pain : 0-10 Pain Score: 3  Pain Type: Chronic pain Pain Location: Hip Pain Orientation: Left Pain Radiating Towards: had surgery in September Pain Descriptors / Indicators: Aching, Constant Pain Onset: More than a month ago Pain Frequency: Constant Pain Relieving Factors: sitting, exercises  Pain Relieving Factors: sitting, exercises  BMI - recorded: 28.7 Nutritional Status: BMI 25 -29 Overweight Nutritional Risks: None Diabetes: Yes CBG done?: No Did pt. bring in CBG monitor from home?: No  How often do you need to have someone help you when you read instructions, pamphlets, or other written materials from your doctor or pharmacy?: 1 - Never  Interpreter Needed?: No  Information entered by :: Kennedy Bucker, LPN   Activities of Daily Living    09/28/2023   11:40 AM 09/01/2023    1:49 PM  In your present state of health, do you have any difficulty performing the following activities:  Hearing? 1 0  Vision? 0 0  Difficulty concentrating or making decisions? 0 0  Walking or climbing stairs? 1 1  Comment hip pain, SHOB   Dressing or bathing? 0 0   Doing errands, shopping?  0 0  Preparing Food and eating ? N   Using the Toilet? N   In the past six months, have you accidently leaked urine? N   Do you have problems with loss of bowel control? N   Managing your Medications? N   Managing your Finances? N   Housekeeping or managing your Housekeeping? N     Patient Care Team: Margarita Mail, DO as PCP - General (Internal Medicine) O'Neal, Ronnald Ramp, MD as PCP - Cardiology (Cardiology) Antony Contras, MD as Consulting Physician (Ophthalmology)  Indicate any recent Medical Services you may have received from other than [facility] providers in the past year (date may be approximate).     Assessment:   This is a routine wellness examination for Danielle [name].  Hearing/Vision screen Hearing Screening - Comments:: Wears aids both ears Vision Screening - Comments:: Readers, had cataract sgy, has implants-    Goals Addressed             This Visit's Progress    DIET - EAT MORE FRUITS AND VEGETABLES         Depression Screen    09/28/2023   11:35 AM 09/11/2023    2:43 PM 09/01/2023    1:49 PM 05/02/2023    2:54 PM 03/07/2023    1:41 PM 11/30/2022    8:58 AM 08/30/2022    9:09 AM  PHQ 2/9 Scores  PHQ - 2 Score 0 0 0 1 0 0 1  PHQ- 9 Score 0  0 1 0 0 1    Fall Risk    09/28/2023   11:39 AM 09/11/2023    2:42 PM 09/01/2023    1:49 PM 05/02/2023    2:52 PM 03/07/2023    1:38 PM  Fall Risk   Falls in the past year? 1 1 1 1 1   Number falls in past yr: 0 0 1 0 0  Injury with Fall? 1 1 1  0 0  Risk for fall due to : Impaired balance/gait;History of fall(s);Orthopedic patient Impaired balance/gait Impaired balance/gait    Follow up Falls evaluation completed;Falls prevention discussed Falls evaluation completed Falls prevention discussed;Education provided;Falls evaluation completed      MEDICARE RISK AT HOME:    TIMED UP AND GO:  Was the test performed?  No    Cognitive Function:        09/08/2022    1:10 PM  6CIT Screen   What Year? 0 points  What month? 0 points  What time? 0 points  Count back from 20 0 points  Months in reverse 0 points  Repeat phrase 0 points  Total Score 0 points    Immunizations Immunization History  Administered Date(s) Administered   Fluad Quad(high Dose 65+) 08/23/2022   Influenza Split 09/29/2007, 10/22/2008, 06/30/2009, 07/27/2011, 10/09/2013   Influenza, High Dose Seasonal PF 08/30/2021   Influenza,inj,Quad PF,6+ Mos 08/03/2016, 08/07/2017, 08/22/2018, 08/27/2019, 08/18/2020   Influenza,inj,quad, With Preservative 07/22/2015   PFIZER(Purple Top)SARS-COV-2 Vaccination 01/24/2020, 02/14/2020   PNEUMOCOCCAL CONJUGATE-20 11/30/2022   Pneumococcal Conjugate-13 01/03/2014   Pneumococcal Polysaccharide-23 05/10/2005   Td 10/07/2002   Tdap 11/16/2011    TDAP status: Due, Education has been provided regarding the importance of this vaccine. Advised may receive this vaccine at local pharmacy or Health Dept. Aware to provide a copy of the vaccination record if obtained from local pharmacy or Health Dept. Verbalized acceptance and understanding.  Flu Vaccine status: Declined, Education has been provided regarding the importance of this vaccine but patient still declined. Advised  may receive this vaccine at local pharmacy or Health Dept. Aware to provide a copy of the vaccination record if obtained from local pharmacy or Health Dept. Verbalized acceptance and understanding.  Pneumococcal vaccine status: Up to date  Covid-19 vaccine status: Completed vaccines  Qualifies for Shingles Vaccine? Yes   Zostavax completed No   Shingrix Completed?: No.    Education has been provided regarding the importance of this vaccine. Patient has been advised to call insurance company to determine out of pocket expense if they have not yet received this vaccine. Advised may also receive vaccine at local pharmacy or Health Dept. Verbalized acceptance and understanding.  Screening Tests Health  Maintenance  Topic Date Due   Zoster Vaccines- Shingrix (1 of 2) Never done   INFLUENZA VACCINE  05/25/2023   COVID-19 Vaccine (3 - 2023-24 season) 06/25/2023   HEMOGLOBIN A1C  12/28/2023   FOOT EXAM  05/31/2024   OPHTHALMOLOGY EXAM  08/20/2024   Medicare Annual Wellness (AWV)  09/27/2024   Colonoscopy  04/22/2026   Pneumonia Vaccine 63+ Years old  Completed   DEXA SCAN  Completed   HPV VACCINES  Aged Out   DTaP/Tdap/Td  Discontinued    Health Maintenance  Health Maintenance Due  Topic Date Due   Zoster Vaccines- Shingrix (1 of 2) Never done   INFLUENZA VACCINE  05/25/2023   COVID-19 Vaccine (3 - 2023-24 season) 06/25/2023    Colorectal cancer screening: No longer required.   Mammogram status: No longer required due to age.   Lung Cancer Screening: (Low Dose CT Chest recommended if Age 3-80 years, 20 pack-year currently smoking OR have quit w/in 15years.) does not qualify.    Additional Screening:  Hepatitis C Screening: does not qualify; Completed no  Vision Screening: Recommended annual ophthalmology exams for early detection of glaucoma and other disorders of the eye. Is the patient up to date with their annual eye exam?  Yes  Who is the provider or what is the name of the office in which the patient attends annual eye exams? Dr.Lyles If pt is not established with a provider, would they like to be referred to a provider to establish care? No .   Dental Screening: Recommended annual dental exams for proper oral hygiene  Diabetic Foot Exam: Diabetic Foot Exam: Completed 06/01/23  Community Resource Referral / Chronic Care Management: CRR required this visit?  No   CCM required this visit?  No     Plan:     I have personally reviewed and noted the following in the patient's chart:   Medical and social history Use of alcohol, tobacco or illicit drugs  Current medications and supplements including opioid prescriptions. Patient is not currently taking opioid  prescriptions. Functional ability and status Nutritional status Physical activity Advanced directives List of other physicians Hospitalizations, surgeries, and ER visits in previous 12 months Vitals Screenings to include cognitive, depression, and falls Referrals and appointments  In addition, I have reviewed and discussed with patient certain preventive protocols, quality metrics, and best practice recommendations. A written personalized care plan for preventive services as well as general preventive health recommendations were provided to patient.     Hal Hope, LPN   [id number]   After Visit Summary: (MyChart) Due to this being a telephonic visit, the after visit summary with patients personalized plan was offered to patient via MyChart   Nurse Notes: none

## 2023-09-28 NOTE — Patient Instructions (Addendum)
Danielle [name] , Thank you for taking time to come for your Medicare Wellness Visit. I appreciate your ongoing commitment to your health goals. Please review the following plan we discussed and let me know if I can assist you in the future.   Referrals/Orders/Follow-Ups/Clinician Recommendations: none  This is a list of the screening recommended for you and due dates:  Health Maintenance  Topic Date Due   Zoster (Shingles) Vaccine (1 of 2) Never done   COVID-19 Vaccine (3 - 2023-24 season) 06/25/2023   Flu Shot  01/22/2024*   Hemoglobin A1C  12/28/2023   Complete foot exam   05/31/2024   Eye exam for diabetics  08/20/2024   Medicare Annual Wellness Visit  09/27/2024   Colon Cancer Screening  04/22/2026   Pneumonia Vaccine  Completed   DEXA scan (bone density measurement)  Completed   HPV Vaccine  Aged Out   DTaP/Tdap/Td vaccine  Discontinued  *Topic was postponed. The date shown is not the original due date.    Advanced directives: (In Chart) A copy of your advanced directives are scanned into your chart should your provider ever need it.  Next Medicare Annual Wellness Visit scheduled for next year: Yes   10/03/24 @ 3:10 pm in person

## 2023-10-01 NOTE — Progress Notes (Unsigned)
Established Patient Office Visit  Subjective:  Patient ID: Danielle [name], female    DOB: [birth year]  Age: 88 y.o. MRN: [id number]  CC:  No chief complaint on file.   HPI Danielle [name] here for follow up on chronic medical conditions.  Since her last visit, she was seen in the ED after a fall and subsequently diagnosed with anemia and had to have blood transfused. Labs from 08/26/23 with stable hgb of 12.1.  Hypertension/A.Fib: -Medications: Diltiazem 120 mg, Metoprolol 50 mg, Xarelto 20 mg -Follows with Cardiology, last seen 09/01/23 -Patient is compliant with above medications and reports no side effects. -Denies any SOB, CP, vision changes, LE edema or symptoms of hypotension.  Does have some mild fatigue.  HLD: -Medications: Lipitor 10 mg -Patient is compliant with above medications and reports no side effects.  -Last lipid panel:  Lipid Panel     Component Value Date/Time   CHOL 108 11/30/2022 0951   TRIG 192 (H) 11/30/2022 0951   HDL 38 (L) 11/30/2022 0951   CHOLHDL 2.8 11/30/2022 0951   VLDL 45 (H) 11/10/2008 0317   LDLCALC 44 11/30/2022 0951    Diabetes, Type 2 with Neuropathy: -Last A1c 7.3% 9/24 -Medications: Metformin 1000 mg BID, Lantus 24-26 units at night, Gabapentin 300 mg TID -Patient is compliant with the above medications and reports no side effects.  -Checking BG at home: fasting 78-135 -Eye exam: UTD 9/23 -Foot exam: Follows with Podiatry, just seen on 09/07/23 -Microalbumin: UTD 2/24 -Statin: yes -PNA vaccine: UTD -Denies symptoms of hypoglycemia, polyuria, polydipsia, numbness extremities, foot ulcers/trauma.   Iron Deficiency Anemia/Vitamin B12 deficiency: -Had been on IV iron in the past, but not on any more -Following with Hematology, following in November to repeat labs -Currently on IM Vitamin B12 injection every 4 weeks   GERD: -Currently on Prilosec 40, doing well  Sensorineural Hearing Loss in left ear due to COVID: -Following with  [facility], note from 01/25/22 reviewed, plan on repeating hearing test and continuing hearing aids.  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  -Having more balance changes with worsening tinnitus bilaterally (worse on left). No ear pain/pressure. No recent viral illness, does have some post-nasal drip.  Gout: -Recently had a flare a few weeks ago in the first digit of her left foot.  She took colchicine and her symptoms resolved -Has had a total of 4 flares  Health maintenance: -Blood work UTD -Mammogram 7/24 Birads-1 - will discuss next summer if the patient would like to continue with screening  -Colonoscopy 6/22, repeat every 5 years   Past Medical History:  Diagnosis Date   Anemia    Anxiety    Arrhythmia    Arthritis    Atrial fibrillation (HCC)    Cataract    bilateral   Colon polyps    Diabetes mellitus without complication (HCC)    type two   Dyspnea    GERD (gastroesophageal reflux disease)    Hypercholesteremia    Hypertension    Iron deficiency anemia    Pancreatitis    Peripheral neuropathy    Refusal of blood transfusions as patient is Jehovah's Witness    Squamous cell skin cancer     Past Surgical History:  Procedure Laterality Date   APPENDECTOMY  1957   BREAST EXCISIONAL BIOPSY Right 1966  CATARACT EXTRACTION, BILATERAL     CHOLECYSTECTOMY  10/2008   lump removed right breast  1958   bENIGN   right ankle plate  7829   TOTAL HIP ARTHROPLASTY Left 07/13/2023   Procedure: TOTAL HIP ARTHROPLASTY ANTERIOR APPROACH;  Surgeon: Samson Frederic, MD;  Location: [facility];  Service: Orthopedics;  Laterality: Left;   VAGINAL HYSTERECTOMY  1978    Family History  Problem Relation Age of Onset   Hypertension Mother    Stroke Mother    Diabetes Mother    Colon cancer Mother 65    Cerebral aneurysm Father    Colon cancer Sister        in her 70s   CAD Sister    Heart attack Sister    Uterine cancer Sister        in her late 70s   Colon cancer Sister        in her 58s   Parkinsonism Sister    Dementia Sister    Heart attack Brother    Heart attack Brother    Throat cancer Brother    Atrial fibrillation Brother    Breast cancer Neg Hx     Social History   Socioeconomic History   Marital status: Widowed    Spouse name: Not on file   Number of children: 3   Years of education: Not on file   Highest education level: Not on file  Occupational History   Occupation: Retired  Tobacco Use   Smoking status: Never   Smokeless tobacco: Never  Vaping Use   Vaping status: Never Used  Substance and Sexual Activity   Alcohol use: No    Alcohol/week: 0.0 standard drinks of alcohol   Drug use: No   Sexual activity: Not Currently  Other Topics Concern   Not on file  Social History Narrative   Not on file   Social Determinants of Health   Financial Resource Strain: Low Risk  (09/28/2023)   Overall Financial Resource Strain (CARDIA)    Difficulty of Paying Living Expenses: Not hard at all  Food Insecurity: No Food Insecurity (09/28/2023)   Hunger Vital Sign    Worried About Running Out of Food in the Last Year: Never true    Ran Out of Food in the Last Year: Never true  Transportation Needs: No Transportation Needs (09/28/2023)   PRAPARE - Administrator, Civil Service (Medical): No    Lack of Transportation (Non-Medical): No  Physical Activity: Insufficiently Active (09/28/2023)   Exercise Vital Sign    Days of Exercise per Week: 3 days    Minutes of Exercise per Session: 20 min  Stress: No Stress Concern Present (09/28/2023)   [employer] of Occupational Health - Occupational Stress Questionnaire    [name] : Not at all  Social Connections: Moderately Integrated (09/28/2023)   Social Connection and Isolation Panel [NHANES]     Frequency of Communication with Friends and Family: More than three times a week    Frequency of Social Gatherings with Friends and Family: Three times a week    Attends Religious Services: More than 4 times per year    Active Member of Clubs or Organizations: Yes    Attends Banker Meetings: More than 4 times per year    Marital Status: Widowed  Intimate Partner Violence: Not At Risk (09/28/2023)   Humiliation, Afraid, Rape, and Kick questionnaire    Fear of Current or Ex-Partner: No    Emotionally  Abused: No    Physically Abused: No    Sexually Abused: No    ROS Review of Systems  Constitutional:  Positive for fatigue. Negative for chills and fever.  HENT:  Positive for hearing loss. Negative for ear pain.   Eyes:  Negative for visual disturbance.  Respiratory:  Negative for cough and shortness of breath.   Cardiovascular:  Negative for chest pain and palpitations.  Gastrointestinal:  Negative for abdominal pain.  Genitourinary:  Positive for flank pain. Negative for dysuria, frequency and hematuria.  Neurological:  Negative for dizziness and headaches.    Objective:   Today's Vitals: There were no vitals taken for this visit.  Physical Exam Constitutional:      Appearance: Normal appearance.  HENT:     Head: Normocephalic and atraumatic.  Eyes:     Conjunctiva/sclera: Conjunctivae normal.  Cardiovascular:     Rate and Rhythm: Normal rate and regular rhythm.  Pulmonary:     Effort: Pulmonary effort is normal.     Breath sounds: Normal breath sounds.  Abdominal:     Tenderness: There is no right CVA tenderness or left CVA tenderness.  Musculoskeletal:     Right lower leg: No edema.     Left lower leg: No edema.  Skin:    General: Skin is warm and dry.  Neurological:     General: No focal deficit present.     Mental Status: She is alert. Mental status is at baseline.  Psychiatric:        Mood and Affect: Mood normal.        Behavior: Behavior normal.      Assessment & Plan:   1. Right flank pain: Urine in the office negative for signs of infection.  - POCT Urinalysis Dipstick  2. Essential hypertension/Atrial fibrillation, unspecified type [facility]): Blood pressure stable, no changes to medications made.  3. Type 2 diabetes mellitus with diabetic polyneuropathy, with long-term current use of insulin (HCC): Last A1c improved to 7.6%.  Continue metformin at 1000 mg twice daily, Lantus 24 to 26 units at night, plan to recheck A1c in 2 months.   Follow-up: No follow-ups on file.   Margarita Mail, DO

## 2023-10-02 ENCOUNTER — Ambulatory Visit: Payer: Medicare Other | Admitting: Internal Medicine

## 2023-10-02 ENCOUNTER — Encounter: Payer: Self-pay | Admitting: Internal Medicine

## 2023-10-02 VITALS — BP 128/86 | HR 98 | Temp 98.1°F | Resp 18 | Ht 64.0 in | Wt 169.3 lb

## 2023-10-02 DIAGNOSIS — Z8739 Personal history of other diseases of the musculoskeletal system and connective tissue: Secondary | ICD-10-CM

## 2023-10-02 DIAGNOSIS — Z9181 History of falling: Secondary | ICD-10-CM

## 2023-10-02 DIAGNOSIS — Z794 Long term (current) use of insulin: Secondary | ICD-10-CM

## 2023-10-02 DIAGNOSIS — E1142 Type 2 diabetes mellitus with diabetic polyneuropathy: Secondary | ICD-10-CM

## 2023-10-02 DIAGNOSIS — D500 Iron deficiency anemia secondary to blood loss (chronic): Secondary | ICD-10-CM

## 2023-10-02 DIAGNOSIS — E538 Deficiency of other specified B group vitamins: Secondary | ICD-10-CM

## 2023-10-02 DIAGNOSIS — R262 Difficulty in walking, not elsewhere classified: Secondary | ICD-10-CM

## 2023-10-02 DIAGNOSIS — W19XXXS Unspecified fall, sequela: Secondary | ICD-10-CM

## 2023-10-02 MED ORDER — COLCHICINE 0.6 MG PO TABS: 0.6000 mg | ORAL_TABLET | Freq: Every day | ORAL | 0 refills | Status: DC | PRN

## 2023-10-03 ENCOUNTER — Telehealth: Payer: Self-pay | Admitting: Internal Medicine

## 2023-10-03 LAB — CBC WITH DIFFERENTIAL/PLATELET
Absolute Lymphocytes: 1392 {cells}/uL (ref 850–3900)
Absolute Monocytes: 396 {cells}/uL (ref 200–950)
Basophils Absolute: 42 {cells}/uL (ref 0–200)
Basophils Relative: 0.7 %
Eosinophils Absolute: 240 {cells}/uL (ref 15–500)
Eosinophils Relative: 4 %
HCT: 38.5 % (ref 35.0–45.0)
Hemoglobin: 12.1 g/dL (ref 11.7–15.5)
MCH: 28.9 pg (ref 27.0–33.0)
MCHC: 31.4 g/dL — ABNORMAL LOW (ref 32.0–36.0)
MCV: 91.9 fL (ref 80.0–100.0)
MPV: 11.2 fL (ref 7.5–12.5)
Monocytes Relative: 6.6 %
Neutro Abs: 3930 {cells}/uL (ref 1500–7800)
Neutrophils Relative %: 65.5 %
Platelets: 288 10*3/uL (ref 140–400)
RBC: 4.19 10*6/uL (ref 3.80–5.10)
RDW: 16.4 % — ABNORMAL HIGH (ref 11.0–15.0)
Total Lymphocyte: 23.2 %
WBC: 6 10*3/uL (ref 3.8–10.8)

## 2023-10-03 LAB — HEMOGLOBIN A1C
Hgb A1c MFr Bld: 6.2 %{Hb} — ABNORMAL HIGH (ref <5.7)
Mean Plasma Glucose: 131 mg/dL
eAG (mmol/L): 7.3 mmol/L

## 2023-10-03 LAB — IRON,TIBC AND FERRITIN PANEL
%SAT: 29 % (ref 16–45)
Ferritin: 164 ng/mL (ref 16–288)
Iron: 74 ug/dL (ref 45–160)
TIBC: 254 ug/dL (ref 250–450)

## 2023-10-03 LAB — VITAMIN B12: Vitamin B-12: 434 pg/mL (ref 200–1100)

## 2023-10-03 NOTE — Telephone Encounter (Signed)
Delay of start of therapy per pt's request  Arline Asp from [facility] called to advise  Best contact: [phone] option 2

## 2023-10-09 DIAGNOSIS — F419 Anxiety disorder, unspecified: Secondary | ICD-10-CM | POA: Diagnosis not present

## 2023-10-09 DIAGNOSIS — Z9181 History of falling: Secondary | ICD-10-CM | POA: Diagnosis not present

## 2023-10-09 DIAGNOSIS — M109 Gout, unspecified: Secondary | ICD-10-CM | POA: Diagnosis not present

## 2023-10-09 DIAGNOSIS — K219 Gastro-esophageal reflux disease without esophagitis: Secondary | ICD-10-CM | POA: Diagnosis not present

## 2023-10-09 DIAGNOSIS — Z85828 Personal history of other malignant neoplasm of skin: Secondary | ICD-10-CM | POA: Diagnosis not present

## 2023-10-09 DIAGNOSIS — H9042 Sensorineural hearing loss, unilateral, left ear, with unrestricted hearing on the contralateral side: Secondary | ICD-10-CM | POA: Diagnosis not present

## 2023-10-09 DIAGNOSIS — E1142 Type 2 diabetes mellitus with diabetic polyneuropathy: Secondary | ICD-10-CM | POA: Diagnosis not present

## 2023-10-09 DIAGNOSIS — I10 Essential (primary) hypertension: Secondary | ICD-10-CM | POA: Diagnosis not present

## 2023-10-09 DIAGNOSIS — Z794 Long term (current) use of insulin: Secondary | ICD-10-CM | POA: Diagnosis not present

## 2023-10-09 DIAGNOSIS — I4891 Unspecified atrial fibrillation: Secondary | ICD-10-CM | POA: Diagnosis not present

## 2023-10-09 DIAGNOSIS — Z7901 Long term (current) use of anticoagulants: Secondary | ICD-10-CM | POA: Diagnosis not present

## 2023-10-09 DIAGNOSIS — Z96642 Presence of left artificial hip joint: Secondary | ICD-10-CM | POA: Diagnosis not present

## 2023-10-09 DIAGNOSIS — R109 Unspecified abdominal pain: Secondary | ICD-10-CM | POA: Diagnosis not present

## 2023-10-09 DIAGNOSIS — E7800 Pure hypercholesterolemia, unspecified: Secondary | ICD-10-CM | POA: Diagnosis not present

## 2023-10-09 DIAGNOSIS — E538 Deficiency of other specified B group vitamins: Secondary | ICD-10-CM | POA: Diagnosis not present

## 2023-10-09 DIAGNOSIS — U071 COVID-19: Secondary | ICD-10-CM | POA: Diagnosis not present

## 2023-10-09 DIAGNOSIS — D500 Iron deficiency anemia secondary to blood loss (chronic): Secondary | ICD-10-CM | POA: Diagnosis not present

## 2023-10-11 DIAGNOSIS — Z794 Long term (current) use of insulin: Secondary | ICD-10-CM | POA: Diagnosis not present

## 2023-10-11 DIAGNOSIS — I4891 Unspecified atrial fibrillation: Secondary | ICD-10-CM | POA: Diagnosis not present

## 2023-10-11 DIAGNOSIS — D500 Iron deficiency anemia secondary to blood loss (chronic): Secondary | ICD-10-CM | POA: Diagnosis not present

## 2023-10-11 DIAGNOSIS — E1142 Type 2 diabetes mellitus with diabetic polyneuropathy: Secondary | ICD-10-CM | POA: Diagnosis not present

## 2023-10-11 DIAGNOSIS — I10 Essential (primary) hypertension: Secondary | ICD-10-CM | POA: Diagnosis not present

## 2023-10-11 DIAGNOSIS — E7800 Pure hypercholesterolemia, unspecified: Secondary | ICD-10-CM | POA: Diagnosis not present

## 2023-10-12 NOTE — Telephone Encounter (Signed)
Copied from CRM [id number]. Topic: General - Other >> Oct 12, 2023 12:18 PM Phill Myron wrote: Home Health Verbal Orders - Caller/Agency: [name] with [facility] Number: [phone] Service Requested: Physical Therapy Frequency: 2w4 and 1w5  Any new concerns about the patient? Yes Yesterday 10/11/23  she did not take BP meds and it ws 170/80

## 2023-10-12 NOTE — Telephone Encounter (Signed)
[name] notified per Dr.Andrews ok for therapy

## 2023-10-13 ENCOUNTER — Other Ambulatory Visit: Payer: Self-pay | Admitting: Internal Medicine

## 2023-10-13 DIAGNOSIS — E1142 Type 2 diabetes mellitus with diabetic polyneuropathy: Secondary | ICD-10-CM

## 2023-10-15 DIAGNOSIS — Z794 Long term (current) use of insulin: Secondary | ICD-10-CM | POA: Diagnosis not present

## 2023-10-15 DIAGNOSIS — D500 Iron deficiency anemia secondary to blood loss (chronic): Secondary | ICD-10-CM | POA: Diagnosis not present

## 2023-10-15 DIAGNOSIS — E1142 Type 2 diabetes mellitus with diabetic polyneuropathy: Secondary | ICD-10-CM | POA: Diagnosis not present

## 2023-10-15 DIAGNOSIS — E7800 Pure hypercholesterolemia, unspecified: Secondary | ICD-10-CM | POA: Diagnosis not present

## 2023-10-15 DIAGNOSIS — I10 Essential (primary) hypertension: Secondary | ICD-10-CM | POA: Diagnosis not present

## 2023-10-15 DIAGNOSIS — I4891 Unspecified atrial fibrillation: Secondary | ICD-10-CM | POA: Diagnosis not present

## 2023-10-16 NOTE — Telephone Encounter (Signed)
Requested Prescriptions  Pending Prescriptions Disp Refills   metFORMIN (GLUCOPHAGE) 1000 MG tablet [Pharmacy Med Name: metFORMIN HCl 1000 MG Oral Tablet] 180 tablet 0    Sig: Take 1 tablet by mouth twice daily     Endocrinology:  Diabetes - Biguanides Failed - 10/16/2023 12:30 PM      Failed - CBC within normal limits and completed in the last 12 months    WBC  Date Value Ref Range Status  10/02/2023 6.0 3.8 - 10.8 Thousand/uL Final   RBC  Date Value Ref Range Status  10/02/2023 4.19 3.80 - 5.10 Million/uL Final   Hemoglobin  Date Value Ref Range Status  10/02/2023 12.1 11.7 - 15.5 g/dL Final  16/07/9603 54.0 (L) 12.0 - 15.0 g/dL Final  98/08/9146 8.6 (L) 11.1 - 15.9 g/dL Final   HCT  Date Value Ref Range Status  10/02/2023 38.5 35.0 - 45.0 % Final   Hematocrit  Date Value Ref Range Status  04/02/2021 29.1 (L) 34.0 - 46.6 % Final   MCHC  Date Value Ref Range Status  10/02/2023 31.4 (L) 32.0 - 36.0 g/dL Final    Comment:    For adults, a slight decrease in the calculated MCHC value (in the range of 30 to 32 g/dL) is most likely not clinically significant; however, it should be interpreted with caution in correlation with other red cell parameters and the patient's clinical condition.    [facility]  Date Value Ref Range Status  10/02/2023 28.9 27.0 - 33.0 pg Final   MCV  Date Value Ref Range Status  10/02/2023 91.9 80.0 - 100.0 fL Final  04/02/2021 76 (L) 79 - 97 fL Final   No results found for: "PLTCOUNTKUC", "LABPLAT", "POCPLA" RDW  Date Value Ref Range Status  10/02/2023 16.4 (H) 11.0 - 15.0 % Final  04/02/2021 17.2 (H) 11.7 - 15.4 % Final         Passed - Cr in normal range and within 360 days    Creat  Date Value Ref Range Status  05/02/2023 0.74 0.60 - 0.95 mg/dL Final   Creatinine, Ser  Date Value Ref Range Status  08/25/2023 0.75 0.44 - 1.00 mg/dL Final   Creatinine, Urine  Date Value Ref Range Status  11/30/2022 158 20 - 275 mg/dL Final          Passed - HBA1C is between 0 and 7.9 and within 180 days    Hgb A1c MFr Bld  Date Value Ref Range Status  10/02/2023 6.2 (H) <5.7 % of total Hgb Final    Comment:    For someone without known diabetes, a hemoglobin  A1c value between 5.7% and 6.4% is consistent with prediabetes and should be confirmed with a  follow-up test. . For someone with known diabetes, a value <7% indicates that their diabetes is well controlled. A1c targets should be individualized based on duration of diabetes, age, comorbid conditions, and other considerations. . This assay result is consistent with an increased risk of diabetes. . Currently, no consensus exists regarding use of hemoglobin A1c for diagnosis of diabetes for children. .          Passed - eGFR in normal range and within 360 days    GFR calc Af Amer  Date Value Ref Range Status  11/12/2008  >60 mL/min Final   >60        The eGFR has been calculated using the MDRD equation. This calculation has not been validated in all clinical situations. eGFR's persistently <  60 mL/min signify possible Chronic Kidney Disease.   GFR, Estimated  Date Value Ref Range Status  08/25/2023 >60 >60 mL/min Final    Comment:    (NOTE) Calculated using the CKD-EPI Creatinine Equation (2021)   04/27/2021 >60 >60 mL/min Final    Comment:    (NOTE) Calculated using the CKD-EPI Creatinine Equation (2021)    eGFR  Date Value Ref Range Status  05/02/2023 79 > OR = 60 mL/min/1.65m2 Final  02/26/2021 71 >59 mL/min/1.73 Final         Passed - B12 Level in normal range and within 720 days    Vitamin B-12  Date Value Ref Range Status  10/02/2023 434 200 - 1,100 pg/mL Final         Passed - Valid encounter within last 6 months    Recent Outpatient Visits           2 weeks ago Fall, sequela   [facility] Margarita Mail, DO   1 month ago Visit for suture removal   [facility] Mecum, Oswaldo Conroy,  PA-C   4 months ago Right flank pain   [facility] Margarita Mail, DO   5 months ago Pre-op evaluation   [facility] Margarita Mail, DO   7 months ago Type 2 diabetes mellitus with diabetic polyneuropathy, with long-term current use of insulin [facility])   [facility] Margarita Mail, DO       Future Appointments             In 2 months Margarita Mail, DO  [facility]   In 2 months O'Neal, Ronnald Ramp, MD [facility]

## 2023-10-20 ENCOUNTER — Ambulatory Visit: Payer: Medicare Other

## 2023-10-20 ENCOUNTER — Ambulatory Visit: Payer: Medicare Other | Admitting: Internal Medicine

## 2023-10-20 DIAGNOSIS — E1142 Type 2 diabetes mellitus with diabetic polyneuropathy: Secondary | ICD-10-CM | POA: Diagnosis not present

## 2023-10-20 DIAGNOSIS — E7800 Pure hypercholesterolemia, unspecified: Secondary | ICD-10-CM | POA: Diagnosis not present

## 2023-10-20 DIAGNOSIS — Z794 Long term (current) use of insulin: Secondary | ICD-10-CM | POA: Diagnosis not present

## 2023-10-20 DIAGNOSIS — I4891 Unspecified atrial fibrillation: Secondary | ICD-10-CM | POA: Diagnosis not present

## 2023-10-20 DIAGNOSIS — D500 Iron deficiency anemia secondary to blood loss (chronic): Secondary | ICD-10-CM | POA: Diagnosis not present

## 2023-10-20 DIAGNOSIS — I10 Essential (primary) hypertension: Secondary | ICD-10-CM | POA: Diagnosis not present

## 2023-10-23 ENCOUNTER — Ambulatory Visit (INDEPENDENT_AMBULATORY_CARE_PROVIDER_SITE_OTHER): Payer: Medicare Other

## 2023-10-23 DIAGNOSIS — E1142 Type 2 diabetes mellitus with diabetic polyneuropathy: Secondary | ICD-10-CM | POA: Diagnosis not present

## 2023-10-23 DIAGNOSIS — D500 Iron deficiency anemia secondary to blood loss (chronic): Secondary | ICD-10-CM | POA: Diagnosis not present

## 2023-10-23 DIAGNOSIS — E7800 Pure hypercholesterolemia, unspecified: Secondary | ICD-10-CM | POA: Diagnosis not present

## 2023-10-23 DIAGNOSIS — E538 Deficiency of other specified B group vitamins: Secondary | ICD-10-CM | POA: Diagnosis not present

## 2023-10-23 DIAGNOSIS — Z794 Long term (current) use of insulin: Secondary | ICD-10-CM | POA: Diagnosis not present

## 2023-10-23 DIAGNOSIS — I10 Essential (primary) hypertension: Secondary | ICD-10-CM | POA: Diagnosis not present

## 2023-10-23 DIAGNOSIS — I4891 Unspecified atrial fibrillation: Secondary | ICD-10-CM | POA: Diagnosis not present

## 2023-10-23 MED ORDER — CYANOCOBALAMIN 1000 MCG/ML IJ SOLN
1000.0000 ug | Freq: Once | INTRAMUSCULAR | Status: AC
  Administered 2023-10-23: 1000 ug via INTRAMUSCULAR

## 2023-10-25 ENCOUNTER — Other Ambulatory Visit: Payer: Self-pay | Admitting: Internal Medicine

## 2023-10-25 DIAGNOSIS — E1142 Type 2 diabetes mellitus with diabetic polyneuropathy: Secondary | ICD-10-CM

## 2023-10-26 DIAGNOSIS — E7800 Pure hypercholesterolemia, unspecified: Secondary | ICD-10-CM | POA: Diagnosis not present

## 2023-10-26 DIAGNOSIS — Z794 Long term (current) use of insulin: Secondary | ICD-10-CM | POA: Diagnosis not present

## 2023-10-26 DIAGNOSIS — I4891 Unspecified atrial fibrillation: Secondary | ICD-10-CM | POA: Diagnosis not present

## 2023-10-26 DIAGNOSIS — E1142 Type 2 diabetes mellitus with diabetic polyneuropathy: Secondary | ICD-10-CM | POA: Diagnosis not present

## 2023-10-26 DIAGNOSIS — I10 Essential (primary) hypertension: Secondary | ICD-10-CM | POA: Diagnosis not present

## 2023-10-26 DIAGNOSIS — D500 Iron deficiency anemia secondary to blood loss (chronic): Secondary | ICD-10-CM | POA: Diagnosis not present

## 2023-10-30 DIAGNOSIS — I4891 Unspecified atrial fibrillation: Secondary | ICD-10-CM | POA: Diagnosis not present

## 2023-10-30 DIAGNOSIS — E7800 Pure hypercholesterolemia, unspecified: Secondary | ICD-10-CM | POA: Diagnosis not present

## 2023-10-30 DIAGNOSIS — E1142 Type 2 diabetes mellitus with diabetic polyneuropathy: Secondary | ICD-10-CM | POA: Diagnosis not present

## 2023-10-30 DIAGNOSIS — Z794 Long term (current) use of insulin: Secondary | ICD-10-CM | POA: Diagnosis not present

## 2023-10-30 DIAGNOSIS — D500 Iron deficiency anemia secondary to blood loss (chronic): Secondary | ICD-10-CM | POA: Diagnosis not present

## 2023-10-30 DIAGNOSIS — I10 Essential (primary) hypertension: Secondary | ICD-10-CM | POA: Diagnosis not present

## 2023-10-30 NOTE — Telephone Encounter (Signed)
 Requested by interface sursecripts. Future visit in 2 months.  Requested Prescriptions  Pending Prescriptions Disp Refills   LANTUS  100 UNIT/ML injection [Pharmacy Med Name: Lantus  100 UNIT/ML Subcutaneous Solution] 10 mL 0    Sig: INJECT 24 TO 26 UNITS SUBCUTANEOUSLY AT BEDTIME     Endocrinology:  Diabetes - Insulins Passed - 10/30/2023  9:21 AM      Passed - HBA1C is between 0 and 7.9 and within 180 days    Hgb A1c MFr Bld  Date Value Ref Range Status  10/02/2023 6.2 (H) <5.7 % of total Hgb Final    Comment:    For someone without known diabetes, a hemoglobin  A1c value between 5.7% and 6.4% is consistent with prediabetes and should be confirmed with a  follow-up test. . For someone with known diabetes, a value <7% indicates that their diabetes is well controlled. A1c targets should be individualized based on duration of diabetes, age, comorbid conditions, and other considerations. . This assay result is consistent with an increased risk of diabetes. . Currently, no consensus exists regarding use of hemoglobin A1c for diagnosis of diabetes for children. SABRA Amy - Valid encounter within last 6 months    Recent Outpatient Visits           4 weeks ago Fall, sequela   [facility] Bernardo Fend, DO   1 month ago Visit for suture removal   [facility] Mecum, Rocky BRAVO, PA-C   4 months ago Right flank pain   [facility] Bernardo Fend, DO   6 months ago Pre-op evaluation   [facility] Bernardo Fend, DO   7 months ago Type 2 diabetes mellitus with diabetic polyneuropathy, with long-term current use of insulin  [facility])   [facility] Bernardo Fend, DO       Future Appointments             In 2 months Bernardo Fend, DO [facility]   In 2 months O'Neal, Darryle Ned, MD [facility]  [facility] at [facility]

## 2023-11-02 DIAGNOSIS — E1142 Type 2 diabetes mellitus with diabetic polyneuropathy: Secondary | ICD-10-CM | POA: Diagnosis not present

## 2023-11-02 DIAGNOSIS — Z794 Long term (current) use of insulin: Secondary | ICD-10-CM | POA: Diagnosis not present

## 2023-11-02 DIAGNOSIS — I10 Essential (primary) hypertension: Secondary | ICD-10-CM | POA: Diagnosis not present

## 2023-11-02 DIAGNOSIS — E7800 Pure hypercholesterolemia, unspecified: Secondary | ICD-10-CM | POA: Diagnosis not present

## 2023-11-02 DIAGNOSIS — I4891 Unspecified atrial fibrillation: Secondary | ICD-10-CM | POA: Diagnosis not present

## 2023-11-02 DIAGNOSIS — D500 Iron deficiency anemia secondary to blood loss (chronic): Secondary | ICD-10-CM | POA: Diagnosis not present

## 2023-11-06 DIAGNOSIS — I10 Essential (primary) hypertension: Secondary | ICD-10-CM | POA: Diagnosis not present

## 2023-11-06 DIAGNOSIS — Z794 Long term (current) use of insulin: Secondary | ICD-10-CM | POA: Diagnosis not present

## 2023-11-06 DIAGNOSIS — D500 Iron deficiency anemia secondary to blood loss (chronic): Secondary | ICD-10-CM | POA: Diagnosis not present

## 2023-11-06 DIAGNOSIS — I4891 Unspecified atrial fibrillation: Secondary | ICD-10-CM | POA: Diagnosis not present

## 2023-11-06 DIAGNOSIS — E1142 Type 2 diabetes mellitus with diabetic polyneuropathy: Secondary | ICD-10-CM | POA: Diagnosis not present

## 2023-11-06 DIAGNOSIS — E7800 Pure hypercholesterolemia, unspecified: Secondary | ICD-10-CM | POA: Diagnosis not present

## 2023-11-08 DIAGNOSIS — Z96642 Presence of left artificial hip joint: Secondary | ICD-10-CM | POA: Diagnosis not present

## 2023-11-08 DIAGNOSIS — I10 Essential (primary) hypertension: Secondary | ICD-10-CM | POA: Diagnosis not present

## 2023-11-08 DIAGNOSIS — Z85828 Personal history of other malignant neoplasm of skin: Secondary | ICD-10-CM | POA: Diagnosis not present

## 2023-11-08 DIAGNOSIS — D500 Iron deficiency anemia secondary to blood loss (chronic): Secondary | ICD-10-CM | POA: Diagnosis not present

## 2023-11-08 DIAGNOSIS — M109 Gout, unspecified: Secondary | ICD-10-CM | POA: Diagnosis not present

## 2023-11-08 DIAGNOSIS — Z7901 Long term (current) use of anticoagulants: Secondary | ICD-10-CM | POA: Diagnosis not present

## 2023-11-08 DIAGNOSIS — U071 COVID-19: Secondary | ICD-10-CM | POA: Diagnosis not present

## 2023-11-08 DIAGNOSIS — I4891 Unspecified atrial fibrillation: Secondary | ICD-10-CM | POA: Diagnosis not present

## 2023-11-08 DIAGNOSIS — H9042 Sensorineural hearing loss, unilateral, left ear, with unrestricted hearing on the contralateral side: Secondary | ICD-10-CM | POA: Diagnosis not present

## 2023-11-08 DIAGNOSIS — E1142 Type 2 diabetes mellitus with diabetic polyneuropathy: Secondary | ICD-10-CM | POA: Diagnosis not present

## 2023-11-08 DIAGNOSIS — K219 Gastro-esophageal reflux disease without esophagitis: Secondary | ICD-10-CM | POA: Diagnosis not present

## 2023-11-08 DIAGNOSIS — Z9181 History of falling: Secondary | ICD-10-CM | POA: Diagnosis not present

## 2023-11-08 DIAGNOSIS — F419 Anxiety disorder, unspecified: Secondary | ICD-10-CM | POA: Diagnosis not present

## 2023-11-08 DIAGNOSIS — E538 Deficiency of other specified B group vitamins: Secondary | ICD-10-CM | POA: Diagnosis not present

## 2023-11-08 DIAGNOSIS — R109 Unspecified abdominal pain: Secondary | ICD-10-CM | POA: Diagnosis not present

## 2023-11-08 DIAGNOSIS — E7800 Pure hypercholesterolemia, unspecified: Secondary | ICD-10-CM | POA: Diagnosis not present

## 2023-11-08 DIAGNOSIS — Z794 Long term (current) use of insulin: Secondary | ICD-10-CM | POA: Diagnosis not present

## 2023-11-14 DIAGNOSIS — I4891 Unspecified atrial fibrillation: Secondary | ICD-10-CM | POA: Diagnosis not present

## 2023-11-14 DIAGNOSIS — I10 Essential (primary) hypertension: Secondary | ICD-10-CM | POA: Diagnosis not present

## 2023-11-14 DIAGNOSIS — D500 Iron deficiency anemia secondary to blood loss (chronic): Secondary | ICD-10-CM | POA: Diagnosis not present

## 2023-11-14 DIAGNOSIS — Z794 Long term (current) use of insulin: Secondary | ICD-10-CM | POA: Diagnosis not present

## 2023-11-14 DIAGNOSIS — E7800 Pure hypercholesterolemia, unspecified: Secondary | ICD-10-CM | POA: Diagnosis not present

## 2023-11-14 DIAGNOSIS — E1142 Type 2 diabetes mellitus with diabetic polyneuropathy: Secondary | ICD-10-CM | POA: Diagnosis not present

## 2023-11-16 ENCOUNTER — Ambulatory Visit: Payer: Medicare Other | Admitting: Podiatry

## 2023-11-20 DIAGNOSIS — K219 Gastro-esophageal reflux disease without esophagitis: Secondary | ICD-10-CM | POA: Diagnosis not present

## 2023-11-20 DIAGNOSIS — E7800 Pure hypercholesterolemia, unspecified: Secondary | ICD-10-CM | POA: Diagnosis not present

## 2023-11-20 DIAGNOSIS — Z794 Long term (current) use of insulin: Secondary | ICD-10-CM | POA: Diagnosis not present

## 2023-11-20 DIAGNOSIS — E1142 Type 2 diabetes mellitus with diabetic polyneuropathy: Secondary | ICD-10-CM | POA: Diagnosis not present

## 2023-11-20 DIAGNOSIS — H9042 Sensorineural hearing loss, unilateral, left ear, with unrestricted hearing on the contralateral side: Secondary | ICD-10-CM | POA: Diagnosis not present

## 2023-11-20 DIAGNOSIS — D500 Iron deficiency anemia secondary to blood loss (chronic): Secondary | ICD-10-CM | POA: Diagnosis not present

## 2023-11-20 DIAGNOSIS — I10 Essential (primary) hypertension: Secondary | ICD-10-CM | POA: Diagnosis not present

## 2023-11-20 DIAGNOSIS — I4891 Unspecified atrial fibrillation: Secondary | ICD-10-CM | POA: Diagnosis not present

## 2023-11-20 DIAGNOSIS — M109 Gout, unspecified: Secondary | ICD-10-CM | POA: Diagnosis not present

## 2023-11-20 DIAGNOSIS — Z8616 Personal history of COVID-19: Secondary | ICD-10-CM | POA: Diagnosis not present

## 2023-11-20 DIAGNOSIS — F419 Anxiety disorder, unspecified: Secondary | ICD-10-CM | POA: Diagnosis not present

## 2023-11-20 DIAGNOSIS — E538 Deficiency of other specified B group vitamins: Secondary | ICD-10-CM | POA: Diagnosis not present

## 2023-11-23 ENCOUNTER — Ambulatory Visit (INDEPENDENT_AMBULATORY_CARE_PROVIDER_SITE_OTHER): Payer: Medicare Other

## 2023-11-23 DIAGNOSIS — I4891 Unspecified atrial fibrillation: Secondary | ICD-10-CM | POA: Diagnosis not present

## 2023-11-23 DIAGNOSIS — E1142 Type 2 diabetes mellitus with diabetic polyneuropathy: Secondary | ICD-10-CM | POA: Diagnosis not present

## 2023-11-23 DIAGNOSIS — E538 Deficiency of other specified B group vitamins: Secondary | ICD-10-CM

## 2023-11-23 DIAGNOSIS — D500 Iron deficiency anemia secondary to blood loss (chronic): Secondary | ICD-10-CM | POA: Diagnosis not present

## 2023-11-23 DIAGNOSIS — I10 Essential (primary) hypertension: Secondary | ICD-10-CM | POA: Diagnosis not present

## 2023-11-23 DIAGNOSIS — E7800 Pure hypercholesterolemia, unspecified: Secondary | ICD-10-CM | POA: Diagnosis not present

## 2023-11-23 DIAGNOSIS — Z794 Long term (current) use of insulin: Secondary | ICD-10-CM | POA: Diagnosis not present

## 2023-11-23 MED ORDER — CYANOCOBALAMIN 1000 MCG/ML IJ SOLN
1000.0000 ug | Freq: Once | INTRAMUSCULAR | Status: AC
  Administered 2023-11-23: 1000 ug via INTRAMUSCULAR

## 2023-11-23 NOTE — Progress Notes (Signed)
Patient is in office today for a nurse visit for B12 Injection. Patient Injection was given in the  Right deltoid. Patient tolerated injection well.

## 2023-11-24 ENCOUNTER — Other Ambulatory Visit: Payer: Self-pay | Admitting: Internal Medicine

## 2023-11-24 DIAGNOSIS — E1142 Type 2 diabetes mellitus with diabetic polyneuropathy: Secondary | ICD-10-CM

## 2023-11-27 NOTE — Telephone Encounter (Signed)
Requested Prescriptions  Pending Prescriptions Disp Refills   LANTUS 100 UNIT/ML injection [Pharmacy Med Name: Lantus 100 UNIT/ML Subcutaneous Solution] 10 mL 0    Sig: INJECT 24 TO 26 UNITS SUBCUTANEOUSLY AT BEDTIME     Endocrinology:  Diabetes - Insulins Passed - 11/27/2023 10:37 AM      Passed - HBA1C is between 0 and 7.9 and within 180 days    Hgb A1c MFr Bld  Date Value Ref Range Status  10/02/2023 6.2 (H) <5.7 % of total Hgb Final    Comment:    For someone without known diabetes, a hemoglobin  A1c value between 5.7% and 6.4% is consistent with prediabetes and should be confirmed with a  follow-up test. . For someone with known diabetes, a value <7% indicates that their diabetes is well controlled. A1c targets should be individualized based on duration of diabetes, age, comorbid conditions, and other considerations. . This assay result is consistent with an increased risk of diabetes. . Currently, no consensus exists regarding use of hemoglobin A1c for diagnosis of diabetes for children. Verna Czech - Valid encounter within last 6 months    Recent Outpatient Visits           1 month ago Fall, sequela   [facility] Margarita Mail, DO   2 months ago Visit for suture removal   [facility] Mecum, Oswaldo Conroy, PA-C   5 months ago Right flank pain   [facility] Margarita Mail, DO   6 months ago Pre-op evaluation   [facility] Margarita Mail, DO   8 months ago Type 2 diabetes mellitus with diabetic polyneuropathy, with long-term current use of insulin [facility])   [facility] Margarita Mail, DO       Future Appointments             In 1 month Margarita Mail, DO [facility]   In 1 month O'Neal, Ronnald Ramp, MD [facility]

## 2023-11-29 DIAGNOSIS — I4891 Unspecified atrial fibrillation: Secondary | ICD-10-CM | POA: Diagnosis not present

## 2023-11-29 DIAGNOSIS — E7800 Pure hypercholesterolemia, unspecified: Secondary | ICD-10-CM | POA: Diagnosis not present

## 2023-11-29 DIAGNOSIS — Z794 Long term (current) use of insulin: Secondary | ICD-10-CM | POA: Diagnosis not present

## 2023-11-29 DIAGNOSIS — D500 Iron deficiency anemia secondary to blood loss (chronic): Secondary | ICD-10-CM | POA: Diagnosis not present

## 2023-11-29 DIAGNOSIS — I10 Essential (primary) hypertension: Secondary | ICD-10-CM | POA: Diagnosis not present

## 2023-11-29 DIAGNOSIS — E1142 Type 2 diabetes mellitus with diabetic polyneuropathy: Secondary | ICD-10-CM | POA: Diagnosis not present

## 2023-12-02 ENCOUNTER — Other Ambulatory Visit: Payer: Self-pay | Admitting: Cardiovascular Disease

## 2023-12-02 ENCOUNTER — Other Ambulatory Visit: Payer: Self-pay | Admitting: Internal Medicine

## 2023-12-02 DIAGNOSIS — E785 Hyperlipidemia, unspecified: Secondary | ICD-10-CM

## 2023-12-04 DIAGNOSIS — I4891 Unspecified atrial fibrillation: Secondary | ICD-10-CM | POA: Diagnosis not present

## 2023-12-04 DIAGNOSIS — E7800 Pure hypercholesterolemia, unspecified: Secondary | ICD-10-CM | POA: Diagnosis not present

## 2023-12-04 DIAGNOSIS — E1142 Type 2 diabetes mellitus with diabetic polyneuropathy: Secondary | ICD-10-CM | POA: Diagnosis not present

## 2023-12-04 DIAGNOSIS — Z794 Long term (current) use of insulin: Secondary | ICD-10-CM | POA: Diagnosis not present

## 2023-12-04 DIAGNOSIS — I10 Essential (primary) hypertension: Secondary | ICD-10-CM | POA: Diagnosis not present

## 2023-12-04 DIAGNOSIS — D500 Iron deficiency anemia secondary to blood loss (chronic): Secondary | ICD-10-CM | POA: Diagnosis not present

## 2023-12-04 NOTE — Telephone Encounter (Signed)
 Requested Prescriptions  Pending Prescriptions Disp Refills   atorvastatin  (LIPITOR) 10 MG tablet [Pharmacy Med Name: Atorvastatin  Calcium  10 MG Oral Tablet] 90 tablet 0    Sig: Take 1 tablet by mouth once daily     Cardiovascular:  Antilipid - Statins Failed - 12/04/2023  2:03 PM      Failed - Lipid Panel in normal range within the last 12 months    Cholesterol  Date Value Ref Range Status  11/30/2022 108 <200 mg/dL Final   LDL Cholesterol (Calc)  Date Value Ref Range Status  11/30/2022 44 mg/dL (calc) Final    Comment:    Reference range: <100 . Desirable range <100 mg/dL for primary prevention;   <70 mg/dL for patients with CHD or diabetic patients  with > or = 2 CHD risk factors. [name] LDL-C is now calculated using the Martin-Hopkins  calculation, which is a validated novel method providing  better accuracy than the Friedewald equation in the  estimation of LDL-C.  Melinda Sprawls et al. Erroll Heard. 9147;829(56): 2061-2068  (http://education.[employer].com/faq/FAQ164)    HDL  Date Value Ref Range Status  11/30/2022 38 (L) > OR = 50 mg/dL Final   Triglycerides  Date Value Ref Range Status  11/30/2022 192 (H) <150 mg/dL Final         Passed - Patient is not pregnant      Passed - Valid encounter within last 12 months    Recent Outpatient Visits           2 months ago Fall, sequela   [facility] Rockney Cid, DO   3 months ago Visit for suture removal   [facility] Mecum, Pearla Bottom, PA-C   6 months ago Right flank pain   [facility] Rockney Cid, DO   7 months ago Pre-op evaluation   [facility] Rockney Cid, DO   9 months ago Type 2 diabetes mellitus with diabetic polyneuropathy, with long-term current use of insulin  [facility])   [facility] Rockney Cid, DO       Future Appointments             In 4 weeks Rockney Cid, DO [facility]   In 1 month O'Neal, Cathay Clonts, MD [facility] at [facility]

## 2023-12-08 ENCOUNTER — Telehealth: Payer: Self-pay | Admitting: Internal Medicine

## 2023-12-08 DIAGNOSIS — Z85828 Personal history of other malignant neoplasm of skin: Secondary | ICD-10-CM | POA: Diagnosis not present

## 2023-12-08 DIAGNOSIS — H9042 Sensorineural hearing loss, unilateral, left ear, with unrestricted hearing on the contralateral side: Secondary | ICD-10-CM | POA: Diagnosis not present

## 2023-12-08 DIAGNOSIS — D500 Iron deficiency anemia secondary to blood loss (chronic): Secondary | ICD-10-CM | POA: Diagnosis not present

## 2023-12-08 DIAGNOSIS — E7800 Pure hypercholesterolemia, unspecified: Secondary | ICD-10-CM | POA: Diagnosis not present

## 2023-12-08 DIAGNOSIS — Z7901 Long term (current) use of anticoagulants: Secondary | ICD-10-CM | POA: Diagnosis not present

## 2023-12-08 DIAGNOSIS — Z794 Long term (current) use of insulin: Secondary | ICD-10-CM | POA: Diagnosis not present

## 2023-12-08 DIAGNOSIS — K219 Gastro-esophageal reflux disease without esophagitis: Secondary | ICD-10-CM | POA: Diagnosis not present

## 2023-12-08 DIAGNOSIS — I10 Essential (primary) hypertension: Secondary | ICD-10-CM | POA: Diagnosis not present

## 2023-12-08 DIAGNOSIS — I4891 Unspecified atrial fibrillation: Secondary | ICD-10-CM | POA: Diagnosis not present

## 2023-12-08 DIAGNOSIS — F419 Anxiety disorder, unspecified: Secondary | ICD-10-CM | POA: Diagnosis not present

## 2023-12-08 DIAGNOSIS — Z9181 History of falling: Secondary | ICD-10-CM | POA: Diagnosis not present

## 2023-12-08 DIAGNOSIS — Z8616 Personal history of COVID-19: Secondary | ICD-10-CM | POA: Diagnosis not present

## 2023-12-08 DIAGNOSIS — Z96642 Presence of left artificial hip joint: Secondary | ICD-10-CM | POA: Diagnosis not present

## 2023-12-08 DIAGNOSIS — E1142 Type 2 diabetes mellitus with diabetic polyneuropathy: Secondary | ICD-10-CM | POA: Diagnosis not present

## 2023-12-08 DIAGNOSIS — Z471 Aftercare following joint replacement surgery: Secondary | ICD-10-CM | POA: Diagnosis not present

## 2023-12-08 DIAGNOSIS — M109 Gout, unspecified: Secondary | ICD-10-CM | POA: Diagnosis not present

## 2023-12-08 DIAGNOSIS — E538 Deficiency of other specified B group vitamins: Secondary | ICD-10-CM | POA: Diagnosis not present

## 2023-12-08 NOTE — Telephone Encounter (Signed)
Is this ok?

## 2023-12-08 NOTE — Telephone Encounter (Signed)
Left message for [name] and [name]

## 2023-12-08 NOTE — Telephone Encounter (Signed)
[name] calling from [facility] is calling for verbal orders to continue PT 1 W 9  Verbal ok on VM CB- [phone]

## 2023-12-13 DIAGNOSIS — I10 Essential (primary) hypertension: Secondary | ICD-10-CM | POA: Diagnosis not present

## 2023-12-13 DIAGNOSIS — I4891 Unspecified atrial fibrillation: Secondary | ICD-10-CM | POA: Diagnosis not present

## 2023-12-13 DIAGNOSIS — E7800 Pure hypercholesterolemia, unspecified: Secondary | ICD-10-CM | POA: Diagnosis not present

## 2023-12-13 DIAGNOSIS — Z794 Long term (current) use of insulin: Secondary | ICD-10-CM | POA: Diagnosis not present

## 2023-12-13 DIAGNOSIS — E1142 Type 2 diabetes mellitus with diabetic polyneuropathy: Secondary | ICD-10-CM | POA: Diagnosis not present

## 2023-12-13 DIAGNOSIS — Z471 Aftercare following joint replacement surgery: Secondary | ICD-10-CM | POA: Diagnosis not present

## 2023-12-14 ENCOUNTER — Encounter: Payer: Self-pay | Admitting: Internal Medicine

## 2023-12-21 ENCOUNTER — Ambulatory Visit (INDEPENDENT_AMBULATORY_CARE_PROVIDER_SITE_OTHER): Payer: Medicare Other

## 2023-12-21 ENCOUNTER — Ambulatory Visit (INDEPENDENT_AMBULATORY_CARE_PROVIDER_SITE_OTHER): Payer: Medicare Other | Admitting: Podiatry

## 2023-12-21 DIAGNOSIS — E538 Deficiency of other specified B group vitamins: Secondary | ICD-10-CM | POA: Diagnosis not present

## 2023-12-21 DIAGNOSIS — M79675 Pain in left toe(s): Secondary | ICD-10-CM | POA: Diagnosis not present

## 2023-12-21 DIAGNOSIS — E1142 Type 2 diabetes mellitus with diabetic polyneuropathy: Secondary | ICD-10-CM | POA: Diagnosis not present

## 2023-12-21 DIAGNOSIS — B351 Tinea unguium: Secondary | ICD-10-CM | POA: Diagnosis not present

## 2023-12-21 DIAGNOSIS — L84 Corns and callosities: Secondary | ICD-10-CM | POA: Diagnosis not present

## 2023-12-21 DIAGNOSIS — M79674 Pain in right toe(s): Secondary | ICD-10-CM | POA: Diagnosis not present

## 2023-12-21 MED ORDER — CYANOCOBALAMIN 1000 MCG/ML IJ SOLN
1000.0000 ug | Freq: Once | INTRAMUSCULAR | Status: AC
Start: 2023-12-21 — End: 2023-12-21
  Administered 2023-12-21: 1000 ug via INTRAMUSCULAR

## 2023-12-21 NOTE — Progress Notes (Signed)
 Patient is in office today for a nurse visit for B12 Injection. Patient Injection was given in the  Right deltoid. Patient tolerated injection well.

## 2023-12-22 ENCOUNTER — Ambulatory Visit: Payer: Self-pay

## 2023-12-22 DIAGNOSIS — Z471 Aftercare following joint replacement surgery: Secondary | ICD-10-CM | POA: Diagnosis not present

## 2023-12-22 DIAGNOSIS — I4891 Unspecified atrial fibrillation: Secondary | ICD-10-CM | POA: Diagnosis not present

## 2023-12-22 DIAGNOSIS — I10 Essential (primary) hypertension: Secondary | ICD-10-CM | POA: Diagnosis not present

## 2023-12-22 DIAGNOSIS — E7800 Pure hypercholesterolemia, unspecified: Secondary | ICD-10-CM | POA: Diagnosis not present

## 2023-12-22 DIAGNOSIS — Z794 Long term (current) use of insulin: Secondary | ICD-10-CM | POA: Diagnosis not present

## 2023-12-22 DIAGNOSIS — E1142 Type 2 diabetes mellitus with diabetic polyneuropathy: Secondary | ICD-10-CM | POA: Diagnosis not present

## 2023-12-24 ENCOUNTER — Encounter: Payer: Self-pay | Admitting: Podiatry

## 2023-12-24 NOTE — Progress Notes (Signed)
 Subjective:  Patient ID: Danielle [name], female    DOB: 1935/10/21,  MRN: [id number]  Danielle [name] presents to clinic today for at risk foot care with history of diabetic neuropathy and callus(es) left foot, porokeratotic lesion(s) right foot, and painful mycotic nails. Painful toenails interfere with ambulation. Aggravating factors include wearing enclosed shoe gear. Pain is relieved with periodic professional debridement. Painful callus(es) and porokeratotic lesion(s) are aggravated when weightbearing with and without shoegear. Pain is relieved with periodic professional debridement.   PCP is Margarita Mail, DO.  Allergies  Allergen Reactions   Amoxicillin Swelling and Rash    Throat swells Tolerated Cephalosporin Date: 07/13/2023.     Hydrochlorothiazide Other (See Comments)    HA and Leg cramps   Shellfish Allergy    Shrimp (Diagnostic) Other (See Comments)    Gout flares   Cleocin [Clindamycin Hcl] Rash   Codeine Rash    And edema   Penicillins Rash    Tolerated Cephalosporin Date: 07/13/2023.      Review of Systems: Negative except as noted in the HPI.  Objective: No changes noted in today's physical examination. There were no vitals filed for this visit. Danielle [name] is a pleasant 89 y.o. female WD, WN in NAD. AAO x 3.  Vascular Examination: Capillary refill time immediate b/l. Vascular status intact b/l with palpable DP pulses; faintly palpable PT pulses. Pedal hair absent b/l. No edema. No pain with calf compression b/l. Skin temperature gradient WNL b/l. No cyanosis or clubbing noted b/l. No ischemia or gangrene b/l.   Neurological Examination: Sensation grossly intact b/l with 10 gram monofilament. Vibratory sensation intact b/l. Pt has subjective symptoms of neuropathy.  Dermatological Examination: Pedal skin with normal turgor, texture and tone b/l.  No open wounds. No interdigital macerations.   Toenails 1-5 b/l thick, discolored, elongated with  subungual debris and pain on dorsal palpation.   Hyperkeratotic lesion(s) submet head 1 left foot.  No erythema, no edema, no drainage, no fluctuance. Porokeratotic lesion(s) submet head 1 right foot. No erythema, no edema, no drainage, no fluctuance.  Musculoskeletal Examination: Muscle strength 5/5 to all lower extremity muscle groups bilaterally. Hallux hammertoe noted b/l LE.  Radiographs: None  Last A1c:      Latest Ref Rng & Units 10/02/2023    3:44 PM 06/30/2023   11:19 AM 05/02/2023    3:23 PM  Hemoglobin A1C  Hemoglobin-A1c <5.7 % of total Hgb 6.2  7.3  7.6    Assessment/Plan: 1. Pain due to onychomycosis of toenails of both feet   2. Pre-ulcerative calluses   3. Diabetic peripheral neuropathy associated with type 2 diabetes mellitus (HCC)     -Patient was evaluated today. All questions/concerns addressed on today's visit. -Patient to continue soft, supportive shoe gear daily. -Mycotic toenails 1-5 bilaterally were debrided in length and girth with sterile nail nippers and dremel. Pinpoint bleeding of right 2nd toe and left great toe addressed with Lumicain Hemostatic Solution. TAO applied. No further treatment required by patient. -Callus(es) submet head 1 left foot pared utilizing sterile scalpel blade without complication or incident. Total number debrided =1. -Porokeratotic lesion(s) submet head 1 right foot pared and enucleated with sterile currette without incident. Total number of lesions debrided=1. -Patient/POA to call should there be question/concern in the interim.   Return in about 3 months (around 03/19/2024).  Freddie Breech, DPM      [place] LOCATION: [address]  [place], [place] [address]                   Office [phone]   [facility] LOCATION: [address] [place], [place] [address] Office [phone]

## 2023-12-25 ENCOUNTER — Telehealth: Payer: Self-pay | Admitting: *Deleted

## 2023-12-25 NOTE — Telephone Encounter (Signed)
   Patient Name: Danielle [name]  DOB: Feb 10, 1935 MRN: [id number]  Primary Cardiologist: Reatha Harps, MD  Chart reviewed as part of pre-operative protocol coverage.   Simple dental extractions (i.e. 1-2 teeth) are considered low risk procedures per guidelines and generally do not require any specific cardiac clearance. It is also generally accepted that for simple extractions and dental cleanings, there is no need to interrupt blood thinner therapy.  SBE prophylaxis is not required for the patient from a cardiac standpoint.  I will route this recommendation to the requesting party via Epic fax function and remove from pre-op pool.  Please call with questions.  Perlie Gold, PA-C 12/25/2023, 2:27 PM

## 2023-12-25 NOTE — Telephone Encounter (Signed)
   Pre-operative Risk Assessment    Patient Name: Danielle [name]  DOB: [birth year] MRN: [id number]   Date of last office visit: 09/04/23 Bernadene Person, NP Date of next office visit: 01/05/24 DR. O'NEAL-YRLY F/U  REQUEST STATES 3RD ATTEMPT. I LEFT MESSAGE FOR [facility] TO CONFIRM WHAT FAX # THEY HAVE AS I SEE NO REQUEST WAS RECEIVED   Request for Surgical Clearance    Procedure:  Dental Extraction - Amount of Teeth to be Pulled:  SIMPLE EXTRACTION OF 1 TOOTH  Date of Surgery:  Clearance 12/27/23                                Surgeon:  NOT LISTED Surgeon's Group or Practice Name:  [facility] Phone number:  [phone] Fax number:  LEFT MESSAGE TO CALL BACK WITH FAX#   Type of Clearance Requested:   - Medical  - Pharmacy:  [facility] Rivaroxaban (Xarelto)     Type of Anesthesia:  Local    Additional requests/questions:    [name]   12/25/2023, 2:02 PM

## 2023-12-25 NOTE — Telephone Encounter (Signed)
 Danielle [name], from [facility] office called back and confirmed fax# [phone]. Danielle [name], also states their office will handle SBE for the pt.

## 2023-12-29 DIAGNOSIS — Z471 Aftercare following joint replacement surgery: Secondary | ICD-10-CM | POA: Diagnosis not present

## 2023-12-29 DIAGNOSIS — Z794 Long term (current) use of insulin: Secondary | ICD-10-CM | POA: Diagnosis not present

## 2023-12-29 DIAGNOSIS — E1142 Type 2 diabetes mellitus with diabetic polyneuropathy: Secondary | ICD-10-CM | POA: Diagnosis not present

## 2023-12-29 DIAGNOSIS — I10 Essential (primary) hypertension: Secondary | ICD-10-CM | POA: Diagnosis not present

## 2023-12-29 DIAGNOSIS — I4891 Unspecified atrial fibrillation: Secondary | ICD-10-CM | POA: Diagnosis not present

## 2023-12-29 DIAGNOSIS — E7800 Pure hypercholesterolemia, unspecified: Secondary | ICD-10-CM | POA: Diagnosis not present

## 2024-01-01 ENCOUNTER — Encounter: Payer: Self-pay | Admitting: Internal Medicine

## 2024-01-01 ENCOUNTER — Ambulatory Visit (INDEPENDENT_AMBULATORY_CARE_PROVIDER_SITE_OTHER): Payer: Medicare Other | Admitting: Internal Medicine

## 2024-01-01 ENCOUNTER — Other Ambulatory Visit: Payer: Self-pay

## 2024-01-01 VITALS — BP 124/82 | HR 87 | Temp 98.0°F | Resp 16 | Ht 64.0 in | Wt 164.8 lb

## 2024-01-01 DIAGNOSIS — I4821 Permanent atrial fibrillation: Secondary | ICD-10-CM

## 2024-01-01 DIAGNOSIS — E1142 Type 2 diabetes mellitus with diabetic polyneuropathy: Secondary | ICD-10-CM | POA: Diagnosis not present

## 2024-01-01 DIAGNOSIS — Z794 Long term (current) use of insulin: Secondary | ICD-10-CM | POA: Diagnosis not present

## 2024-01-01 DIAGNOSIS — E785 Hyperlipidemia, unspecified: Secondary | ICD-10-CM | POA: Diagnosis not present

## 2024-01-01 LAB — POCT GLYCOSYLATED HEMOGLOBIN (HGB A1C): Hemoglobin A1C: 7.2 % — AB (ref 4.0–5.6)

## 2024-01-01 MED ORDER — ATORVASTATIN CALCIUM 10 MG PO TABS: 10.0000 mg | ORAL_TABLET | Freq: Every day | ORAL | 1 refills | Status: DC

## 2024-01-01 MED ORDER — METFORMIN HCL 1000 MG PO TABS: 1000.0000 mg | ORAL_TABLET | Freq: Two times a day (BID) | ORAL | 1 refills | Status: DC

## 2024-01-01 NOTE — Assessment & Plan Note (Signed)
 A1c controlled at 7.2% today.  No changes made to medications.

## 2024-01-01 NOTE — Assessment & Plan Note (Signed)
 Stable, continue statin

## 2024-01-01 NOTE — Assessment & Plan Note (Signed)
 Stable, doing well on her diltiazem, beta-blocker and Xarelto.

## 2024-01-01 NOTE — Progress Notes (Signed)
 Established Patient Office Visit  Subjective:  Patient ID: Danielle [name], female    DOB: August 31, 1935  Age: 89 y.o. MRN: [id number]  CC:  Chief Complaint  Patient presents with   Medical Management of Chronic Issues    3 month follow up    HPI Danielle [name] here for follow up on chronic medical conditions.  Since her last visit, she had to have a tooth pulled, had to be on antibiotics since her hip surgery.  Hypertension/A.Fib: -Medications: Diltiazem 120 mg, Metoprolol 50 mg, Xarelto 20 mg -Follows with Cardiology -Patient is compliant with above medications and reports no side effects. -Denies any SOB, CP, vision changes, LE edema or symptoms of hypotension.  Does have some mild fatigue.  HLD: -Medications: Lipitor 10 mg -Patient is compliant with above medications and reports no side effects.  -Last lipid panel:  Lipid Panel     Component Value Date/Time   CHOL 108 11/30/2022 0951   TRIG 192 (H) 11/30/2022 0951   HDL 38 (L) 11/30/2022 0951   CHOLHDL 2.8 11/30/2022 0951   VLDL 45 (H) 11/10/2008 0317   LDLCALC 44 11/30/2022 0951    Diabetes, Type 2 with Neuropathy: -Last A1c 6.2% 12/24 -Medications: Metformin 1000 mg BID, Lantus 24-26 units at night, Gabapentin 300 mg TID -Patient is compliant with the above medications and reports no side effects.  -Eye exam: UTD  -Foot exam: Follows with Podiatry, just seen on 09/07/23 -Microalbumin: UTD 2/24 -Statin: yes -PNA vaccine: UTD -Denies symptoms of hypoglycemia, polyuria, polydipsia, numbness extremities, foot ulcers/trauma.   Iron Deficiency Anemia/Vitamin B12 deficiency: -Had been on IV iron in the past, had to have 2 iron infusions recently after her fall -Following with Hematology -Currently on IM Vitamin B12 injection every 4 weeks   GERD: -Currently on Prilosec 40, doing well  Sensorineural Hearing Loss in left ear due to COVID: -Following with [facility], note from 01/25/22  reviewed, plan on repeating hearing test and continuing hearing aids.  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  -Having more balance changes with worsening tinnitus bilaterally (worse on left). No ear pain/pressure. No recent viral illness, does have some post-nasal drip.   Gout: -No flare since last office visit -Takes colchicine as needed  Health maintenance: -Blood work UTD -Mammogram 7/24 Birads-1 - will discuss next summer if the patient would like to continue with screening  -Colonoscopy 6/22, repeat every 5 years   Past Medical History:  Diagnosis Date   Anemia    Anxiety    Arrhythmia    Arthritis    Atrial fibrillation (HCC)    Cataract    bilateral   Colon polyps    Diabetes mellitus without complication (HCC)    type two   Dyspnea    GERD (gastroesophageal reflux disease)    Hypercholesteremia    Hypertension    Iron deficiency anemia    Pancreatitis    Peripheral neuropathy    Refusal of blood transfusions as patient is Jehovah's Witness    Squamous cell skin cancer     Past Surgical History:  Procedure Laterality Date   APPENDECTOMY  1957   BREAST EXCISIONAL BIOPSY Right 1966   CATARACT EXTRACTION, BILATERAL     CHOLECYSTECTOMY  10/2008   lump removed right breast  1958   bENIGN   right ankle  plate  1610   TOTAL HIP ARTHROPLASTY Left 07/13/2023   Procedure: TOTAL HIP ARTHROPLASTY ANTERIOR APPROACH;  Surgeon: Samson Frederic, MD;  Location: [facility];  Service: Orthopedics;  Laterality: Left;   VAGINAL HYSTERECTOMY  1978    Family History  Problem Relation Age of Onset   Hypertension Mother    Stroke Mother    Diabetes Mother    Colon cancer Mother 20   Cerebral aneurysm Father    Colon cancer Sister        in her 58s   CAD Sister    Heart attack Sister    Uterine cancer Sister        in her late  38s   Colon cancer Sister        in her 37s   Parkinsonism Sister    Dementia Sister    Heart attack Brother    Heart attack Brother    Throat cancer Brother    Atrial fibrillation Brother    Breast cancer Neg Hx     Social History   Socioeconomic History   Marital status: Widowed    Spouse name: Not on file   Number of children: 3   Years of education: Not on file   Highest education level: Associate degree: occupational, Scientist, product/process development, or vocational program  Occupational History   Occupation: Retired  Tobacco Use   Smoking status: Never   Smokeless tobacco: Never  Vaping Use   Vaping status: Never Used  Substance and Sexual Activity   Alcohol use: No    Alcohol/week: 0.0 standard drinks of alcohol   Drug use: No   Sexual activity: Not Currently  Other Topics Concern   Not on file  Social History Narrative   Not on file   Social Drivers of Health   Financial Resource Strain: Low Risk  (12/31/2023)   Overall Financial Resource Strain (CARDIA)    Difficulty of Paying Living Expenses: Not very hard  Food Insecurity: No Food Insecurity (12/31/2023)   Hunger Vital Sign    Worried About Running Out of Food in the Last Year: Never true    Ran Out of Food in the Last Year: Never true  Transportation Needs: No Transportation Needs (12/31/2023)   PRAPARE - Administrator, Civil Service (Medical): No    Lack of Transportation (Non-Medical): No  Physical Activity: Insufficiently Active (12/31/2023)   Exercise Vital Sign    Days of Exercise per Week: 2 days    Minutes of Exercise per Session: 20 min  Stress: No Stress Concern Present (12/31/2023)   [employer] of Occupational Health - Occupational Stress Questionnaire    [name] : Only a little  Social Connections: Moderately Integrated (09/28/2023)   Social Connection and Isolation Panel [NHANES]    Frequency of Communication with Friends and Family: More than three times a week    Frequency of Social  Gatherings with Friends and Family: Three times a week    Attends Religious Services: More than 4 times per year    Active Member of Clubs or Organizations: Yes    Attends Banker Meetings: More than 4 times per year    Marital Status: Widowed  Intimate Partner Violence: Not At Risk (09/28/2023)   Humiliation, Afraid, Rape, and Kick questionnaire    Fear of Current or Ex-Partner: No    Emotionally Abused: No    Physically Abused: No    Sexually Abused: No    ROS Review of Systems  All other systems reviewed and are negative.   Objective:   Today's Vitals: BP 124/82 (Cuff Size: Large)   Pulse 87   Temp 98 F (36.7 C) (Oral)   Resp 16   Ht 5\' 4"  (1.626 m)   Wt 164 lb 12.8 oz (74.8 kg)   SpO2 98%   BMI 28.29 kg/m   Physical Exam Constitutional:      Appearance: Normal appearance.  HENT:     Head: Normocephalic and atraumatic.  Eyes:     Conjunctiva/sclera: Conjunctivae normal.  Cardiovascular:     Rate and Rhythm: Normal rate and regular rhythm.  Pulmonary:     Effort: Pulmonary effort is normal.     Breath sounds: Normal breath sounds.  Skin:    General: Skin is warm and dry.  Neurological:     General: No focal deficit present.     Mental Status: She is alert. Mental status is at baseline.  Psychiatric:        Mood and Affect: Mood normal.        Behavior: Behavior normal.     Assessment & Plan:   Type 2 diabetes mellitus with diabetic polyneuropathy, with long-term current use of insulin (HCC) Assessment & Plan: A1c controlled at 7.2% today.  No changes made to medications.  Orders: -     POCT glycosylated hemoglobin (Hb A1C) -     metFORMIN HCl; Take 1 tablet (1,000 mg total) by mouth 2 (two) times daily.  Dispense: 180 tablet; Refill: 1  Hyperlipidemia, unspecified hyperlipidemia type Assessment & Plan: Stable, continue statin.  Orders: -     Atorvastatin Calcium; Take 1 tablet (10 mg total) by mouth daily.  Dispense: 90 tablet;  Refill: 1  Permanent atrial fibrillation (HCC) Assessment & Plan: Stable, doing well on her diltiazem, beta-blocker and Xarelto.    Follow-up: Return in about 6 months (around 07/03/2024).   Margarita Mail, DO

## 2024-01-03 DIAGNOSIS — E7800 Pure hypercholesterolemia, unspecified: Secondary | ICD-10-CM | POA: Diagnosis not present

## 2024-01-03 DIAGNOSIS — E1142 Type 2 diabetes mellitus with diabetic polyneuropathy: Secondary | ICD-10-CM | POA: Diagnosis not present

## 2024-01-03 DIAGNOSIS — I4891 Unspecified atrial fibrillation: Secondary | ICD-10-CM | POA: Diagnosis not present

## 2024-01-03 DIAGNOSIS — Z471 Aftercare following joint replacement surgery: Secondary | ICD-10-CM | POA: Diagnosis not present

## 2024-01-03 DIAGNOSIS — Z794 Long term (current) use of insulin: Secondary | ICD-10-CM | POA: Diagnosis not present

## 2024-01-03 DIAGNOSIS — I10 Essential (primary) hypertension: Secondary | ICD-10-CM | POA: Diagnosis not present

## 2024-01-04 ENCOUNTER — Other Ambulatory Visit: Payer: Self-pay | Admitting: Internal Medicine

## 2024-01-04 DIAGNOSIS — E1142 Type 2 diabetes mellitus with diabetic polyneuropathy: Secondary | ICD-10-CM

## 2024-01-04 NOTE — Progress Notes (Unsigned)
 Cardiology Office Note:  .   Date:  01/05/2024  ID:  Danielle [name], DOB 1935/09/17, MRN [id number] PCP: Margarita Mail, DO  [facility] Providers Cardiologist:  Reatha Harps, MD { History of Present Illness: .    Chief Complaint  Patient presents with   Follow-up         Danielle [name] is a 89 y.o. female with history of persistent Afib, anemia, HTN, HLD, DM who presents for follow-up.    History of Present Illness   Danielle [name] is an 89 year old female with persistent atrial fibrillation, anemia, DM, HTN who presents for follow-up.  She is on a rate control strategy for her persistent atrial fibrillation, taking metoprolol succinate 50 mg daily and diltiazem 120 mg extended release. Her heart rate at home has varied from the 70s to over 100 bpm, but is currently well-controlled at 85 bpm. No chest pain, but she experiences occasional shortness of breath, particularly when rushing.  She is on Xarelto for anticoagulation due to atrial fibrillation but has experienced bleeding complications, including prolonged bleeding after a tooth extraction. Recent fall with bleeding. She is concerned about managing her stroke risk without long-term anticoagulation due to her history of falls and anemia.  She has a history of anemia, exacerbated by a recent fall and subsequent hip surgery. She experienced significant blood loss from a deep cut on her forehead during the fall, requiring iron infusions. Anemia contributes to her shortness of breath.  A recent fall resulted in a head injury and significant blood loss, leading to anemia. The fall necessitated hip surgery, and she is currently receiving physical therapy to improve balance and strengthen her leg post-hip replacement.  Her blood pressure is well-controlled, and she continues to manage her diabetes. She is on Lipitor 10 mg for cholesterol management, with her LDL cholesterol at goal.          Problem List 1. DM -A1c  7.2 2. HLD -T chol 108, HDL 38, LDL 44, TG 192 3. HTN 4. Permanent Afib -Dx 02/26/2021 -rate control strategy  -CHADSVASC=5 (age, female, DM, HTN) 5. Iron deficiency anemia -normal colonoscopy/EGD -B12 deficiency  6. RBBB/LAFB    ROS: All other ROS reviewed and negative. Pertinent positives noted in the HPI.     Studies Reviewed: Marland Kitchen   EKG Interpretation Date/Time:  Friday January 05 2024 15:24:45 EDT Ventricular Rate:  85 PR Interval:    QRS Duration:  140 QT Interval:  406 QTC Calculation: 483 R Axis:   -54  Text Interpretation: Atrial fibrillation Left axis deviation Right bundle branch block Confirmed by Lennie Odor [phone]) on 01/05/2024 3:25:36 PM    TTE 03/23/2021  1. Left ventricular ejection fraction, by estimation, is 55 to 60%. The  left ventricle has normal function. The left ventricle has no regional  wall motion abnormalities. There is mild left ventricular hypertrophy.  Left ventricular diastolic function  could not be evaluated.   2. Right ventricular systolic function is normal. The right ventricular  size is normal. There is moderately elevated pulmonary artery systolic  pressure. The estimated right ventricular systolic pressure is 45.2 mmHg.   3. Left atrial size was severely dilated.   4. The mitral valve is abnormal. Trivial mitral valve regurgitation.   5. Tricuspid valve regurgitation is mild to moderate.   6. The aortic valve is tricuspid. Aortic valve regurgitation is not  visualized. Mild to moderate aortic valve sclerosis/calcification is  present, without any evidence of  aortic stenosis.   7. The inferior vena cava is normal in size with <50% respiratory  variability, suggesting right atrial pressure of 8 mmHg.  Physical Exam:   VS:  BP 132/74   Pulse 85   Ht 5' 4.5" (1.638 m)   Wt 168 lb 9.6 oz (76.5 kg)   SpO2 99%   BMI 28.49 kg/m    Wt Readings from Last 3 Encounters:  01/05/24 168 lb 9.6 oz (76.5 kg)  01/01/24 164 lb 12.8 oz (74.8 kg)   10/02/23 169 lb 4.8 oz (76.8 kg)    GEN: Well nourished, well developed in no acute distress NECK: No JVD; No carotid bruits CARDIAC: irregular rhythm, no murmurs, rubs, gallops RESPIRATORY:  Clear to auscultation without rales, wheezing or rhonchi  ABDOMEN: Soft, non-tender, non-distended EXTREMITIES:  No edema; No deformity  ASSESSMENT AND PLAN: .   Assessment and Plan    Atrial Fibrillation, persistent Chronic atrial fibrillation, rate controlled at 85 bpm, asymptomatic. EKG shows atrial fibrillation with right bundle branch block. Discussed Watchman procedure to reduce stroke risk and potentially discontinue Xarelto due to bleeding risks from falls and anemia. - Continue metoprolol succinate 50 mg daily. - Continue diltiazem 120 mg extended release daily. - Refill metoprolol and diltiazem prescriptions. - Continue Xarelto. - Provided information on the Watchman procedure. - Consider referral for Watchman procedure if interested. - She is an excellent Watchman candidate.   Anemia Chronic anemia exacerbated by recent blood loss from fall and hip surgery. Receiving iron infusions. - Continue iron infusions as needed.  Hypertension Blood pressure well controlled at 132/74 mmHg.  Diabetes Mellitus Diabetes management ongoing. - Continue current diabetes management.  Hyperlipidemia LDL cholesterol at goal with current treatment of 10 mg Lipitor.  Follow-up Discussed potential interest in the Watchman procedure. - Schedule follow-up in one year. - Contact office if interested in Watchman procedure for referral.      [facility] Referral for Left Atrial Appendage Closure with Non-Valvular Atrial Fibrillation   Danielle [name] is a 89 y.o. female is being referred to the [facility] Team for evaluation for Left Atrial Appendage Closure with Watchman device for the management of stroke risk resulting form non-valvular atrial fibrillation.    Base upon [name]'s history, she is felt to be a poor candidate for long-term anticoagulation because of a history of bleeding (e.g. intracerebral, subdural, GI, retro-peritoneal), a high risk of recurrent falls, and anemia .  The patient has a HAS-BLED score of 4 indicating a Yearly Major Bleeding Risk of 8.7%.      Her CHADS2-VASc Score is 5 with an unadjusted Ischemic Stroke Rate (% per year) of 7.2%.   Her stroke risk necessitates a strategy of stroke prevention with either long-term oral anticoagulation or left atrial appendage occlusion therapy. We have discussed their bleeding risk in the context of their comorbid medical problems, as well as the rationale for referral for evaluation of Watchman left atrial appendage occlusion therapy. While the patient is at high long-term bleeding risk, they may be appropriate for short-term anticoagulation. Based on this individual patient's stroke and bleeding risk, a shared decision has been made to refer the patient for consideration of Watchman left atrial appendage closure utilizing the [employer] of Cardiology shared decision tool.            Follow-up: Return in about 1 year (around 01/04/2025).   Signed, Lenna Gilford. Flora Lipps, MD, [facility]  [facility]  [phone]  [address], [address] [place], [place] [address] [phone]  5:23 PM

## 2024-01-04 NOTE — Telephone Encounter (Signed)
 Requested Prescriptions  Pending Prescriptions Disp Refills   LANTUS 100 UNIT/ML injection [Pharmacy Med Name: Lantus 100 UNIT/ML Subcutaneous Solution] 10 mL 0    Sig: INJECT 24 TO 26 UNITS SUBCUTANEOUSLY AT BEDTIME     Endocrinology:  Diabetes - Insulins Passed - 01/04/2024 12:19 PM      Passed - HBA1C is between 0 and 7.9 and within 180 days    Hemoglobin A1C  Date Value Ref Range Status  01/01/2024 7.2 (A) 4.0 - 5.6 % Final   Hgb A1c MFr Bld  Date Value Ref Range Status  10/02/2023 6.2 (H) <5.7 % of total Hgb Final    Comment:    For someone without known diabetes, a hemoglobin  A1c value between 5.7% and 6.4% is consistent with prediabetes and should be confirmed with a  follow-up test. . For someone with known diabetes, a value <7% indicates that their diabetes is well controlled. A1c targets should be individualized based on duration of diabetes, age, comorbid conditions, and other considerations. . This assay result is consistent with an increased risk of diabetes. . Currently, no consensus exists regarding use of hemoglobin A1c for diagnosis of diabetes for children. Verna Czech - Valid encounter within last 6 months    Recent Outpatient Visits           3 months ago Fall, sequela   [facility] Margarita Mail, DO   4 months ago Visit for suture removal   [facility] Mecum, Oswaldo Conroy, PA-C   7 months ago Right flank pain   [facility] Margarita Mail, DO   8 months ago Pre-op evaluation   [facility] Margarita Mail, DO   10 months ago Type 2 diabetes mellitus with diabetic polyneuropathy, with long-term current use of insulin [facility])   [facility] Margarita Mail, DO       Future Appointments             Tomorrow O'Neal, Ronnald Ramp, MD [facility]   In 6 months Margarita Mail, DO [facility]

## 2024-01-05 ENCOUNTER — Encounter: Payer: Self-pay | Admitting: Cardiovascular Disease

## 2024-01-05 ENCOUNTER — Other Ambulatory Visit: Payer: Self-pay | Admitting: Cardiovascular Disease

## 2024-01-05 ENCOUNTER — Ambulatory Visit: Payer: Medicare Other | Attending: Cardiovascular Disease | Admitting: Cardiovascular Disease

## 2024-01-05 VITALS — BP 132/74 | HR 85 | Ht 64.5 in | Wt 168.6 lb

## 2024-01-05 DIAGNOSIS — D6869 Other thrombophilia: Secondary | ICD-10-CM | POA: Diagnosis not present

## 2024-01-05 DIAGNOSIS — I4821 Permanent atrial fibrillation: Secondary | ICD-10-CM | POA: Insufficient documentation

## 2024-01-05 DIAGNOSIS — I10 Essential (primary) hypertension: Secondary | ICD-10-CM | POA: Insufficient documentation

## 2024-01-05 DIAGNOSIS — E785 Hyperlipidemia, unspecified: Secondary | ICD-10-CM | POA: Insufficient documentation

## 2024-01-05 MED ORDER — DILTIAZEM HCL ER 120 MG PO CP24: 120.0000 mg | ORAL_CAPSULE | Freq: Every day | ORAL | 0 refills | Status: DC

## 2024-01-05 MED ORDER — METOPROLOL SUCCINATE ER 50 MG PO TB24: 50.0000 mg | ORAL_TABLET | Freq: Every day | ORAL | 2 refills | Status: DC

## 2024-01-05 NOTE — Patient Instructions (Signed)

## 2024-01-07 DIAGNOSIS — Z85828 Personal history of other malignant neoplasm of skin: Secondary | ICD-10-CM | POA: Diagnosis not present

## 2024-01-07 DIAGNOSIS — Z8616 Personal history of COVID-19: Secondary | ICD-10-CM | POA: Diagnosis not present

## 2024-01-07 DIAGNOSIS — E7800 Pure hypercholesterolemia, unspecified: Secondary | ICD-10-CM | POA: Diagnosis not present

## 2024-01-07 DIAGNOSIS — K219 Gastro-esophageal reflux disease without esophagitis: Secondary | ICD-10-CM | POA: Diagnosis not present

## 2024-01-07 DIAGNOSIS — Z794 Long term (current) use of insulin: Secondary | ICD-10-CM | POA: Diagnosis not present

## 2024-01-07 DIAGNOSIS — E538 Deficiency of other specified B group vitamins: Secondary | ICD-10-CM | POA: Diagnosis not present

## 2024-01-07 DIAGNOSIS — H9042 Sensorineural hearing loss, unilateral, left ear, with unrestricted hearing on the contralateral side: Secondary | ICD-10-CM | POA: Diagnosis not present

## 2024-01-07 DIAGNOSIS — Z7901 Long term (current) use of anticoagulants: Secondary | ICD-10-CM | POA: Diagnosis not present

## 2024-01-07 DIAGNOSIS — M109 Gout, unspecified: Secondary | ICD-10-CM | POA: Diagnosis not present

## 2024-01-07 DIAGNOSIS — I4891 Unspecified atrial fibrillation: Secondary | ICD-10-CM | POA: Diagnosis not present

## 2024-01-07 DIAGNOSIS — I10 Essential (primary) hypertension: Secondary | ICD-10-CM | POA: Diagnosis not present

## 2024-01-07 DIAGNOSIS — Z471 Aftercare following joint replacement surgery: Secondary | ICD-10-CM | POA: Diagnosis not present

## 2024-01-07 DIAGNOSIS — D500 Iron deficiency anemia secondary to blood loss (chronic): Secondary | ICD-10-CM | POA: Diagnosis not present

## 2024-01-07 DIAGNOSIS — Z96642 Presence of left artificial hip joint: Secondary | ICD-10-CM | POA: Diagnosis not present

## 2024-01-07 DIAGNOSIS — E1142 Type 2 diabetes mellitus with diabetic polyneuropathy: Secondary | ICD-10-CM | POA: Diagnosis not present

## 2024-01-07 DIAGNOSIS — F419 Anxiety disorder, unspecified: Secondary | ICD-10-CM | POA: Diagnosis not present

## 2024-01-07 DIAGNOSIS — Z9181 History of falling: Secondary | ICD-10-CM | POA: Diagnosis not present

## 2024-01-09 DIAGNOSIS — I4891 Unspecified atrial fibrillation: Secondary | ICD-10-CM | POA: Diagnosis not present

## 2024-01-09 DIAGNOSIS — E7800 Pure hypercholesterolemia, unspecified: Secondary | ICD-10-CM | POA: Diagnosis not present

## 2024-01-09 DIAGNOSIS — E1142 Type 2 diabetes mellitus with diabetic polyneuropathy: Secondary | ICD-10-CM | POA: Diagnosis not present

## 2024-01-09 DIAGNOSIS — Z471 Aftercare following joint replacement surgery: Secondary | ICD-10-CM | POA: Diagnosis not present

## 2024-01-09 DIAGNOSIS — Z794 Long term (current) use of insulin: Secondary | ICD-10-CM | POA: Diagnosis not present

## 2024-01-09 DIAGNOSIS — I10 Essential (primary) hypertension: Secondary | ICD-10-CM | POA: Diagnosis not present

## 2024-01-18 ENCOUNTER — Ambulatory Visit

## 2024-01-18 DIAGNOSIS — E538 Deficiency of other specified B group vitamins: Secondary | ICD-10-CM

## 2024-01-18 MED ORDER — CYANOCOBALAMIN 1000 MCG/ML IJ SOLN
1000.0000 ug | Freq: Once | INTRAMUSCULAR | Status: AC
  Administered 2024-01-18: 1000 ug via INTRAMUSCULAR

## 2024-01-18 NOTE — Progress Notes (Signed)
 Patient is in office today for a nurse visit for B12 Injection. Patient Injection was given in the  Left deltoid. Patient tolerated injection well.

## 2024-01-23 DIAGNOSIS — I10 Essential (primary) hypertension: Secondary | ICD-10-CM | POA: Diagnosis not present

## 2024-01-23 DIAGNOSIS — Z471 Aftercare following joint replacement surgery: Secondary | ICD-10-CM | POA: Diagnosis not present

## 2024-01-23 DIAGNOSIS — I4891 Unspecified atrial fibrillation: Secondary | ICD-10-CM | POA: Diagnosis not present

## 2024-01-23 DIAGNOSIS — E7800 Pure hypercholesterolemia, unspecified: Secondary | ICD-10-CM | POA: Diagnosis not present

## 2024-01-23 DIAGNOSIS — Z794 Long term (current) use of insulin: Secondary | ICD-10-CM | POA: Diagnosis not present

## 2024-01-23 DIAGNOSIS — E1142 Type 2 diabetes mellitus with diabetic polyneuropathy: Secondary | ICD-10-CM | POA: Diagnosis not present

## 2024-02-02 DIAGNOSIS — Z471 Aftercare following joint replacement surgery: Secondary | ICD-10-CM | POA: Diagnosis not present

## 2024-02-02 DIAGNOSIS — E7800 Pure hypercholesterolemia, unspecified: Secondary | ICD-10-CM | POA: Diagnosis not present

## 2024-02-02 DIAGNOSIS — E1142 Type 2 diabetes mellitus with diabetic polyneuropathy: Secondary | ICD-10-CM | POA: Diagnosis not present

## 2024-02-02 DIAGNOSIS — I10 Essential (primary) hypertension: Secondary | ICD-10-CM | POA: Diagnosis not present

## 2024-02-02 DIAGNOSIS — I4891 Unspecified atrial fibrillation: Secondary | ICD-10-CM | POA: Diagnosis not present

## 2024-02-02 DIAGNOSIS — Z794 Long term (current) use of insulin: Secondary | ICD-10-CM | POA: Diagnosis not present

## 2024-02-09 ENCOUNTER — Other Ambulatory Visit: Payer: Self-pay | Admitting: Internal Medicine

## 2024-02-09 DIAGNOSIS — E1142 Type 2 diabetes mellitus with diabetic polyneuropathy: Secondary | ICD-10-CM

## 2024-02-09 NOTE — Telephone Encounter (Signed)
 Requested Prescriptions  Pending Prescriptions Disp Refills   LANTUS  100 UNIT/ML injection [Pharmacy Med Name: Lantus  100 UNIT/ML Subcutaneous Solution] 10 mL 1    Sig: INJECT 24 TO 26 UNITS SUBCUTANEOUSLY AT BEDTIME     Endocrinology:  Diabetes - Insulins Passed - 02/09/2024  1:22 PM      Passed - HBA1C is between 0 and 7.9 and within 180 days    Hemoglobin A1C  Date Value Ref Range Status  01/01/2024 7.2 (A) 4.0 - 5.6 % Final   Hgb A1c MFr Bld  Date Value Ref Range Status  10/02/2023 6.2 (H) <5.7 % of total Hgb Final    Comment:    For someone without known diabetes, a hemoglobin  A1c value between 5.7% and 6.4% is consistent with prediabetes and should be confirmed with a  follow-up test. . For someone with known diabetes, a value <7% indicates that their diabetes is well controlled. A1c targets should be individualized based on duration of diabetes, age, comorbid conditions, and other considerations. . This assay result is consistent with an increased risk of diabetes. . Currently, no consensus exists regarding use of hemoglobin A1c for diagnosis of diabetes for children. Temple Feeler - Valid encounter within last 6 months    Recent Outpatient Visits           1 month ago Type 2 diabetes mellitus with diabetic polyneuropathy, with long-term current use of insulin  [facility])   [place] [facility] Rockney Cid, DO       Future Appointments             In 4 months Rockney Cid, DO [facility], [facility]

## 2024-02-12 ENCOUNTER — Telehealth: Payer: Self-pay

## 2024-02-12 NOTE — Telephone Encounter (Signed)
 Copied from CRM [id number]. Topic: Appointments - Appointment Scheduling >> Feb 12, 2024 10:06 AM Baldemar Lev wrote: Patient/patient representative is calling to schedule an appointment. Refer to attachments for appointment information.    Needs B12 Shot, looking for Monday May 28th in the afternoon.   Best contact: [phone]

## 2024-02-19 ENCOUNTER — Ambulatory Visit (INDEPENDENT_AMBULATORY_CARE_PROVIDER_SITE_OTHER)

## 2024-02-19 DIAGNOSIS — E538 Deficiency of other specified B group vitamins: Secondary | ICD-10-CM | POA: Diagnosis not present

## 2024-02-19 MED ORDER — CYANOCOBALAMIN 1000 MCG/ML IJ SOLN
1000.0000 ug | Freq: Once | INTRAMUSCULAR | Status: AC
Start: 2024-02-19 — End: 2024-02-19
  Administered 2024-02-19: 1000 ug via INTRAMUSCULAR

## 2024-02-19 NOTE — Progress Notes (Signed)
 Patient is in office today for a nurse visit for B12 Injection. Patient Injection was given in the  Left deltoid. Patient tolerated injection well.

## 2024-03-15 ENCOUNTER — Other Ambulatory Visit: Payer: Self-pay | Admitting: Internal Medicine

## 2024-03-15 DIAGNOSIS — K219 Gastro-esophageal reflux disease without esophagitis: Secondary | ICD-10-CM

## 2024-03-15 DIAGNOSIS — Z8739 Personal history of other diseases of the musculoskeletal system and connective tissue: Secondary | ICD-10-CM

## 2024-03-19 NOTE — Telephone Encounter (Signed)
 Requested Prescriptions  Pending Prescriptions Disp Refills   colchicine  0.6 MG tablet [Pharmacy Med Name: Colchicine  0.6 MG Oral Tablet] 90 tablet 0    Sig: TAKE 1 TABLET BY MOUTH ONCE DAILY AS NEEDED FOR GOUT FLARES     Endocrinology:  Gout Agents - colchicine  Passed - 03/19/2024  3:56 PM      Passed - Cr in normal range and within 360 days    Creat  Date Value Ref Range Status  05/02/2023 0.74 0.60 - 0.95 mg/dL Final   Creatinine, Ser  Date Value Ref Range Status  08/25/2023 0.75 0.44 - 1.00 mg/dL Final   Creatinine, Urine  Date Value Ref Range Status  11/30/2022 158 20 - 275 mg/dL Final         Passed - ALT in normal range and within 360 days    ALT  Date Value Ref Range Status  05/02/2023 15 6 - 29 U/L Final  04/27/2021 27 0 - 44 U/L Final         Passed - AST in normal range and within 360 days    AST  Date Value Ref Range Status  05/02/2023 13 10 - 35 U/L Final  04/27/2021 28 15 - 41 U/L Final         Passed - Valid encounter within last 12 months    Recent Outpatient Visits           2 months ago Type 2 diabetes mellitus with diabetic polyneuropathy, with long-term current use of insulin  (HCC)   [place] Rockney Cid, DO       Future Appointments             In 3 months Rockney Cid, DO [facility], [facility]            Passed - CBC within normal limits and completed in the last 12 months    WBC  Date Value Ref Range Status  10/02/2023 6.0 3.8 - 10.8 Thousand/uL Final   RBC  Date Value Ref Range Status  10/02/2023 4.19 3.80 - 5.10 Million/uL Final   Hemoglobin  Date Value Ref Range Status  10/02/2023 12.1 11.7 - 15.5 g/dL Final  95/28/4132 44.0 (L) 12.0 - 15.0 g/dL Final  08/20/2535 8.6 (L) 11.1 - 15.9 g/dL Final   HCT  Date Value Ref Range Status  10/02/2023 38.5 35.0 - 45.0 % Final   Hematocrit  Date Value Ref Range Status  04/02/2021 29.1 (L) 34.0 - 46.6 % Final   MCHC   Date Value Ref Range Status  10/02/2023 31.4 (L) 32.0 - 36.0 g/dL Final    Comment:    For adults, a slight decrease in the calculated MCHC value (in the range of 30 to 32 g/dL) is most likely not clinically significant; however, it should be interpreted with caution in correlation with other red cell parameters and the patient's clinical condition.    [facility]  Date Value Ref Range Status  10/02/2023 28.9 27.0 - 33.0 pg Final   MCV  Date Value Ref Range Status  10/02/2023 91.9 80.0 - 100.0 fL Final  04/02/2021 76 (L) 79 - 97 fL Final   No results found for: "PLTCOUNTKUC", "LABPLAT", "POCPLA" RDW  Date Value Ref Range Status  10/02/2023 16.4 (H) 11.0 - 15.0 % Final  04/02/2021 17.2 (H) 11.7 - 15.4 % Final          omeprazole  (PRILOSEC) 40 MG capsule [Pharmacy Med Name: OMEPRAZOLE  DR 40MG   CAP] 90 capsule 0    Sig: Take 1 capsule by mouth once daily     Gastroenterology: Proton Pump Inhibitors Passed - 03/19/2024  3:56 PM      Passed - Valid encounter within last 12 months    Recent Outpatient Visits           2 months ago Type 2 diabetes mellitus with diabetic polyneuropathy, with long-term current use of insulin  [facility])   [facility] Rockney Cid, DO       Future Appointments             In 3 months Rockney Cid, DO [facility], [facility]

## 2024-03-20 ENCOUNTER — Ambulatory Visit

## 2024-03-20 DIAGNOSIS — E538 Deficiency of other specified B group vitamins: Secondary | ICD-10-CM

## 2024-03-20 MED ORDER — CYANOCOBALAMIN 1000 MCG/ML IJ SOLN
1000.0000 ug | Freq: Once | INTRAMUSCULAR | Status: AC
  Administered 2024-03-20: 1000 ug via INTRAMUSCULAR

## 2024-03-20 NOTE — Progress Notes (Signed)
 Patient is in office today for a nurse visit for B12 Injection. Patient Injection was given in the  Left deltoid. Patient tolerated injection well.

## 2024-03-25 ENCOUNTER — Ambulatory Visit (INDEPENDENT_AMBULATORY_CARE_PROVIDER_SITE_OTHER): Payer: Medicare Other | Admitting: Podiatry

## 2024-03-25 ENCOUNTER — Encounter: Payer: Self-pay | Admitting: Podiatry

## 2024-03-25 VITALS — Ht 64.5 in | Wt 168.6 lb

## 2024-03-25 DIAGNOSIS — Q828 Other specified congenital malformations of skin: Secondary | ICD-10-CM | POA: Diagnosis not present

## 2024-03-25 DIAGNOSIS — M79674 Pain in right toe(s): Secondary | ICD-10-CM

## 2024-03-25 DIAGNOSIS — B351 Tinea unguium: Secondary | ICD-10-CM

## 2024-03-25 DIAGNOSIS — L84 Corns and callosities: Secondary | ICD-10-CM | POA: Diagnosis not present

## 2024-03-25 DIAGNOSIS — E1142 Type 2 diabetes mellitus with diabetic polyneuropathy: Secondary | ICD-10-CM

## 2024-03-25 DIAGNOSIS — M79675 Pain in left toe(s): Secondary | ICD-10-CM

## 2024-03-28 ENCOUNTER — Other Ambulatory Visit: Payer: Self-pay | Admitting: Cardiovascular Disease

## 2024-03-28 DIAGNOSIS — I4821 Permanent atrial fibrillation: Secondary | ICD-10-CM

## 2024-03-29 NOTE — Telephone Encounter (Signed)
 Prescription refill request for Xarelto  received.  Indication:afib Last office visit:3/25 Weight:76.5  kg Age:89 Scr:0.75  11/24 CrCl:63.82  ml/min  Prescription refilled

## 2024-03-31 ENCOUNTER — Encounter: Payer: Self-pay | Admitting: Podiatry

## 2024-03-31 NOTE — Progress Notes (Signed)
 Subjective:  Patient ID: Danielle [name], female    DOB: 09/05/1935,  MRN: [id number]  Danielle [name]: callus(es) of both feet and painful thick toenails that are difficult to trim. Painful toenails interfere with ambulation. Aggravating factors include wearing enclosed shoe gear. Pain is relieved with periodic professional debridement. Painful calluses are aggravated when weightbearing with and without shoegear. Pain is relieved with periodic professional debridement.  Chief Complaint  Patient presents with   Nail Problem    Pt is here [name] last A1C was 7.6 PCP is Dr Bud Care and LOV was in March.    PCP is Rockney Cid, DO.  Allergies  Allergen Reactions   Amoxicillin Swelling and Rash    Throat swells Tolerated Cephalosporin Date: 07/13/2023.     Hydrochlorothiazide Other (See Comments)    HA and Leg cramps   Shellfish Allergy    Shrimp (Diagnostic) Other (See Comments)    Gout flares   Cleocin [Clindamycin Hcl] Rash   Codeine Rash    And edema   Penicillins Rash    Tolerated Cephalosporin Date: 07/13/2023.      Review of Systems: Negative except as noted in the HPI.  Objective: No changes noted in today's physical examination. There were no vitals filed [name] this visit.  Danielle [name] is a pleasant 89 y.o. female WD, WN in NAD. AAO x 3.  Vascular Examination: Capillary refill time <3 seconds b/l.Palpable DP pulse(s) b/l LE. Faintly palpable PT pulse(s) b/l LE. Pedal hair absent. No edema noted b/l LE. No cyanosis or clubbing noted b/l LE.Danielle [name]  Dermatological Examination: Pedal skin with normal turgor, texture and tone b/l. No open wounds. No interdigital macerations b/l. Toenails 1-5 b/l thickened, discolored, dystrophic with subungual debris. There is pain on palpation to dorsal aspect of nailplates. Hyperkeratotic lesion(s) medial IPJ of left great toe.  No erythema, no edema, no drainage, no fluctuance. Porokeratotic lesion(s) submet  head 1 right foot. No erythema, no edema, no drainage, no fluctuance..  Neurological Examination: Protective sensation intact with 10 gram monofilament b/l LE. Vibratory sensation intact b/l LE. Pt has subjective symptoms of neuropathy.  Musculoskeletal Examination: Muscle strength 5/5 to all lower extremity muscle groups bilaterally. Hammertoe(s) bilateral great toes.     Latest Ref Rng & Units 01/01/2024    2:57 PM 10/02/2023    3:44 PM 06/30/2023   11:19 AM 05/02/2023    3:23 PM  Hemoglobin A1C  Hemoglobin-A1c 4.0 - 5.6 % 7.2  6.2  7.3  7.6    Assessment/Plan: 1. Pain due to onychomycosis of toenails of both feet   2. Callus   3. Porokeratosis   4. Diabetic peripheral neuropathy associated with type 2 diabetes mellitus (HCC)     -Consent given [name] treatment as described below: -Examined patient. -Continue foot and shoe inspections daily. Monitor blood glucose per PCP/Endocrinologist's recommendations. -Patient to continue soft, supportive shoe gear daily. -Toenails 1-5 b/l were debrided in length and girth with sterile nail nippers and dremel without iatrogenic bleeding.  -Callus(es) medial IPJ of left great toe pared utilizing sterile scalpel blade without complication or incident. Total number debrided =1. -Porokeratotic lesion(s) submet head 1 right foot pared and enucleated with sterile currette without incident. Total number of lesions debrided=1. -Patient/POA to call should there be question/concern in the interim.   Return in about 3 months (around 06/25/2024).  Danielle [name], DPM      [place] LOCATION: [address]  [place], [place] [address]                   Office [phone]   [facility] LOCATION: [address] [place], [place] [address] Office [phone]

## 2024-04-03 ENCOUNTER — Other Ambulatory Visit: Payer: Self-pay | Admitting: Internal Medicine

## 2024-04-17 ENCOUNTER — Other Ambulatory Visit: Payer: Self-pay | Admitting: Emergency Medicine

## 2024-04-17 ENCOUNTER — Ambulatory Visit (INDEPENDENT_AMBULATORY_CARE_PROVIDER_SITE_OTHER)

## 2024-04-17 DIAGNOSIS — E538 Deficiency of other specified B group vitamins: Secondary | ICD-10-CM

## 2024-04-17 DIAGNOSIS — E1165 Type 2 diabetes mellitus with hyperglycemia: Secondary | ICD-10-CM

## 2024-04-17 MED ORDER — ACCU-CHEK GUIDE TEST VI STRP: ORAL_STRIP | 12 refills | Status: DC

## 2024-04-17 MED ORDER — CYANOCOBALAMIN 1000 MCG/ML IJ SOLN
1000.0000 ug | Freq: Once | INTRAMUSCULAR | Status: AC
  Administered 2024-04-17: 1000 ug via INTRAMUSCULAR

## 2024-04-17 NOTE — Progress Notes (Signed)
 Patient is in office today for a nurse visit for B12 Injection. Patient Injection was given in the  Left deltoid. Patient tolerated injection well.

## 2024-04-19 ENCOUNTER — Other Ambulatory Visit: Payer: Self-pay | Admitting: Internal Medicine

## 2024-04-19 DIAGNOSIS — E1142 Type 2 diabetes mellitus with diabetic polyneuropathy: Secondary | ICD-10-CM

## 2024-04-22 NOTE — Telephone Encounter (Signed)
 Requested Prescriptions  Pending Prescriptions Disp Refills   LANTUS  100 UNIT/ML injection [Pharmacy Med Name: Lantus  100 UNIT/ML Subcutaneous Solution] 10 mL 1    Sig: INJECT 24 TO 26 UNITS SUBCUTANEOUSLY AT BEDTIME (DISCARD REMAINDER OF BOTTLE AFTER 28 DAYS)     Endocrinology:  Diabetes - Insulins Passed - 04/22/2024  3:03 PM      Passed - HBA1C is between 0 and 7.9 and within 180 days    Hemoglobin A1C  Date Value Ref Range Status  01/01/2024 7.2 (A) 4.0 - 5.6 % Final   Hgb A1c MFr Bld  Date Value Ref Range Status  10/02/2023 6.2 (H) <5.7 % of total Hgb Final    Comment:    For someone without known diabetes, a hemoglobin  A1c value between 5.7% and 6.4% is consistent with prediabetes and should be confirmed with a  follow-up test. . For someone with known diabetes, a value <7% indicates that their diabetes is well controlled. A1c targets should be individualized based on duration of diabetes, age, comorbid conditions, and other considerations. . This assay result is consistent with an increased risk of diabetes. . Currently, no consensus exists regarding use of hemoglobin A1c for diagnosis of diabetes for children. SABRA Amy - Valid encounter within last 6 months    Recent Outpatient Visits           3 months ago Type 2 diabetes mellitus with diabetic polyneuropathy, with long-term current use of insulin  [facility])   [place] [facility] Bernardo Fend, DO       Future Appointments             In 2 months Bernardo Fend, DO [facility], [facility]

## 2024-05-16 ENCOUNTER — Ambulatory Visit

## 2024-05-16 DIAGNOSIS — E538 Deficiency of other specified B group vitamins: Secondary | ICD-10-CM

## 2024-05-16 MED ORDER — CYANOCOBALAMIN 1000 MCG/ML IJ SOLN
1000.0000 ug | Freq: Once | INTRAMUSCULAR | Status: AC
  Administered 2024-05-16: 1000 ug via INTRAMUSCULAR

## 2024-05-18 ENCOUNTER — Other Ambulatory Visit: Payer: Self-pay | Admitting: Internal Medicine

## 2024-05-18 DIAGNOSIS — E1142 Type 2 diabetes mellitus with diabetic polyneuropathy: Secondary | ICD-10-CM

## 2024-05-20 NOTE — Telephone Encounter (Signed)
 Requested Prescriptions  Pending Prescriptions Disp Refills   gabapentin  (NEURONTIN ) 300 MG capsule [Pharmacy Med Name: Gabapentin  300 MG Oral Capsule] 270 capsule 0    Sig: TAKE 3 CAPSULES BY MOUTH AT BEDTIME     Neurology: Anticonvulsants - gabapentin  Passed - 05/20/2024  3:34 PM      Passed - Cr in normal range and within 360 days    Creat  Date Value Ref Range Status  05/02/2023 0.74 0.60 - 0.95 mg/dL Final   Creatinine, Ser  Date Value Ref Range Status  08/25/2023 0.75 0.44 - 1.00 mg/dL Final   Creatinine, Urine  Date Value Ref Range Status  11/30/2022 158 20 - 275 mg/dL Final         Passed - Completed PHQ-2 or PHQ-9 in the last 360 days      Passed - Valid encounter within last 12 months    Recent Outpatient Visits           4 months ago Type 2 diabetes mellitus with diabetic polyneuropathy, with long-term current use of insulin  [facility])   [place] [facility] Bernardo Fend, DO       Future Appointments             In 1 month Bernardo Fend, DO [facility], [facility]

## 2024-06-19 ENCOUNTER — Ambulatory Visit (INDEPENDENT_AMBULATORY_CARE_PROVIDER_SITE_OTHER)

## 2024-06-19 DIAGNOSIS — E538 Deficiency of other specified B group vitamins: Secondary | ICD-10-CM | POA: Diagnosis not present

## 2024-06-19 MED ORDER — CYANOCOBALAMIN 1000 MCG/ML IJ SOLN
1000.0000 ug | Freq: Once | INTRAMUSCULAR | Status: AC
  Administered 2024-06-19: 1000 ug via INTRAMUSCULAR

## 2024-06-19 NOTE — Progress Notes (Signed)
 Patient is in office today for a nurse visit for B12 Injection. Patient Injection was given in the  Left deltoid. Patient tolerated injection well.

## 2024-06-28 ENCOUNTER — Other Ambulatory Visit: Payer: Self-pay | Admitting: Internal Medicine

## 2024-06-28 DIAGNOSIS — E1142 Type 2 diabetes mellitus with diabetic polyneuropathy: Secondary | ICD-10-CM

## 2024-06-28 NOTE — Telephone Encounter (Signed)
 Requested Prescriptions  Pending Prescriptions Disp Refills   LANTUS  100 UNIT/ML injection [Pharmacy Med Name: Lantus  100 UNIT/ML Subcutaneous Solution] 10 mL 0    Sig: INJECT 24-26 UNITS SUBCUTANEOUSLY AT BEDTIME (DISCARD REMAINDER OF BOTTLE AFTER 28 DAYS)     Endocrinology:  Diabetes - Insulins Passed - 06/28/2024 11:15 AM      Passed - HBA1C is between 0 and 7.9 and within 180 days    Hemoglobin A1C  Date Value Ref Range Status  01/01/2024 7.2 (A) 4.0 - 5.6 % Final   Hgb A1c MFr Bld  Date Value Ref Range Status  10/02/2023 6.2 (H) <5.7 % of total Hgb Final    Comment:    For someone without known diabetes, a hemoglobin  A1c value between 5.7% and 6.4% is consistent with prediabetes and should be confirmed with a  follow-up test. . For someone with known diabetes, a value <7% indicates that their diabetes is well controlled. A1c targets should be individualized based on duration of diabetes, age, comorbid conditions, and other considerations. . This assay result is consistent with an increased risk of diabetes. . Currently, no consensus exists regarding use of hemoglobin A1c for diagnosis of diabetes for children. SABRA Amy - Valid encounter within last 6 months    Recent Outpatient Visits           5 months ago Type 2 diabetes mellitus with diabetic polyneuropathy, with long-term current use of insulin  [facility])   [place] [facility] Bernardo Fend, DO       Future Appointments             In 6 days Bernardo Fend, DO [facility], [place]

## 2024-07-01 ENCOUNTER — Ambulatory Visit: Admitting: Podiatry

## 2024-07-01 DIAGNOSIS — M2042 Other hammer toe(s) (acquired), left foot: Secondary | ICD-10-CM

## 2024-07-01 DIAGNOSIS — M79674 Pain in right toe(s): Secondary | ICD-10-CM | POA: Diagnosis not present

## 2024-07-01 DIAGNOSIS — L84 Corns and callosities: Secondary | ICD-10-CM

## 2024-07-01 DIAGNOSIS — E1142 Type 2 diabetes mellitus with diabetic polyneuropathy: Secondary | ICD-10-CM | POA: Diagnosis not present

## 2024-07-01 DIAGNOSIS — B351 Tinea unguium: Secondary | ICD-10-CM

## 2024-07-01 DIAGNOSIS — S90222A Contusion of left lesser toe(s) with damage to nail, initial encounter: Secondary | ICD-10-CM

## 2024-07-01 DIAGNOSIS — M2041 Other hammer toe(s) (acquired), right foot: Secondary | ICD-10-CM | POA: Diagnosis not present

## 2024-07-01 DIAGNOSIS — E1140 Type 2 diabetes mellitus with diabetic neuropathy, unspecified: Secondary | ICD-10-CM | POA: Insufficient documentation

## 2024-07-01 DIAGNOSIS — M79675 Pain in left toe(s): Secondary | ICD-10-CM

## 2024-07-01 DIAGNOSIS — E119 Type 2 diabetes mellitus without complications: Secondary | ICD-10-CM | POA: Diagnosis not present

## 2024-07-04 ENCOUNTER — Ambulatory Visit (INDEPENDENT_AMBULATORY_CARE_PROVIDER_SITE_OTHER): Admitting: Internal Medicine

## 2024-07-04 ENCOUNTER — Encounter: Payer: Self-pay | Admitting: Internal Medicine

## 2024-07-04 ENCOUNTER — Other Ambulatory Visit: Payer: Self-pay

## 2024-07-04 ENCOUNTER — Encounter: Payer: Self-pay | Admitting: Podiatry

## 2024-07-04 VITALS — BP 128/76 | HR 100 | Temp 98.1°F | Resp 15 | Ht 64.5 in | Wt 173.3 lb

## 2024-07-04 DIAGNOSIS — R0601 Orthopnea: Secondary | ICD-10-CM

## 2024-07-04 DIAGNOSIS — E559 Vitamin D deficiency, unspecified: Secondary | ICD-10-CM | POA: Diagnosis not present

## 2024-07-04 DIAGNOSIS — I10 Essential (primary) hypertension: Secondary | ICD-10-CM

## 2024-07-04 DIAGNOSIS — E1165 Type 2 diabetes mellitus with hyperglycemia: Secondary | ICD-10-CM | POA: Diagnosis not present

## 2024-07-04 DIAGNOSIS — I480 Paroxysmal atrial fibrillation: Secondary | ICD-10-CM

## 2024-07-04 DIAGNOSIS — E538 Deficiency of other specified B group vitamins: Secondary | ICD-10-CM

## 2024-07-04 DIAGNOSIS — R1310 Dysphagia, unspecified: Secondary | ICD-10-CM | POA: Diagnosis not present

## 2024-07-04 DIAGNOSIS — E785 Hyperlipidemia, unspecified: Secondary | ICD-10-CM | POA: Diagnosis not present

## 2024-07-04 DIAGNOSIS — E1142 Type 2 diabetes mellitus with diabetic polyneuropathy: Secondary | ICD-10-CM

## 2024-07-04 DIAGNOSIS — Z23 Encounter for immunization: Secondary | ICD-10-CM | POA: Diagnosis not present

## 2024-07-04 DIAGNOSIS — Z794 Long term (current) use of insulin: Secondary | ICD-10-CM | POA: Diagnosis not present

## 2024-07-04 MED ORDER — METFORMIN HCL 1000 MG PO TABS: 1000.0000 mg | ORAL_TABLET | Freq: Two times a day (BID) | ORAL | 1 refills | Status: AC

## 2024-07-04 MED ORDER — ATORVASTATIN CALCIUM 10 MG PO TABS: 10.0000 mg | ORAL_TABLET | Freq: Every day | ORAL | 1 refills | Status: AC

## 2024-07-04 MED ORDER — ACCU-CHEK GUIDE TEST VI STRP: ORAL_STRIP | 12 refills | Status: AC

## 2024-07-04 MED ORDER — LANTUS SOLOSTAR 100 UNIT/ML ~~LOC~~ SOPN: 30.0000 [IU] | PEN_INJECTOR | Freq: Every day | SUBCUTANEOUS | 99 refills | Status: DC

## 2024-07-04 NOTE — Progress Notes (Signed)
 Established Patient Office Visit  Subjective:  Patient ID: Danielle [name], female    DOB: 1934-11-24  Age: 89 y.o. MRN: [id number]  CC:  Chief Complaint  Patient presents with   Medical Management of Chronic Issues    6 month recheck    HPI Danielle [name] here for follow up on chronic medical conditions.    Discussed the use of AI scribe software for clinical note transcription with the patient, who gave verbal consent to proceed.  History of Present Illness Danielle [name] is an 89 year old female with dysphagia and atrial fibrillation who presents with difficulty swallowing and shortness of breath.  She experiences difficulty swallowing, particularly with large pills and certain foods like steak and bread. There is a sensation of food getting stuck, requiring extensive chewing before swallowing. She manages liquids without issue. This problem began earlier this year.  She has atrial fibrillation and experiences shortness of breath, especially at night when lying flat, necessitating sleeping with her head elevated. She also experiences shortness of breath with exertion, such as walking quickly.  Her current medications include Lantus  insulin , metformin , gabapentin , Xarelto , diltiazem , metoprolol , and Lipitor. She recently encountered an issue with Lantus  being on backorder and discussed switching to a pen form. She uses 30 units of Lantus  daily and also uses test strips for diabetes management.  Her family history includes a younger sister who has had esophageal stretching procedures.   Hypertension/A.Fib: -Medications: Diltiazem  120 mg, Metoprolol  50 mg, Xarelto  20 mg -Follows with Cardiology -Patient is compliant with above medications and reports no side effects. -Denies any SOB, CP, vision changes, LE edema or symptoms of hypotension.  Does have some mild fatigue.  HLD: -Medications: Lipitor 10 mg -Patient is compliant with above medications and reports no side effects.   -Last lipid panel:  Lipid Panel     Component Value Date/Time   CHOL 108 11/30/2022 0951   TRIG 192 (H) 11/30/2022 0951   HDL 38 (L) 11/30/2022 0951   CHOLHDL 2.8 11/30/2022 0951   VLDL 45 (H) 11/10/2008 0317   LDLCALC 44 11/30/2022 0951    Diabetes, Type 2 with Neuropathy: -Last A1c 7.2% 3/25 -Medications: Metformin  1000 mg BID, Lantus  30 units at night, Gabapentin  300 mg TID -Patient is compliant with the above medications and reports no side effects.  -Eye exam: UTD  -Foot exam: Follows with Podiatry, seen on 09/07/23 -Microalbumin: UTD -Statin: yes -PNA vaccine: UTD -Denies symptoms of hypoglycemia, polyuria, polydipsia, numbness extremities, foot ulcers/trauma.   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  -Having more balance changes with worsening tinnitus bilaterally (worse on left). No ear pain/pressure. No recent viral illness, does have some post-nasal drip.   Gout: -No flare since last office visit -Takes colchicine  as needed  Health maintenance: -Blood work due -Mammogram 7/24 Birads-1 - will discuss next summer if the patient would like to continue with screening  -Colonoscopy 6/22, repeat every 5 years   Past Medical History:  Diagnosis  Date   Anemia    Anxiety    Arrhythmia    Arthritis    Atrial fibrillation (HCC)    Cataract    bilateral   Colon polyps    Diabetes mellitus without complication (HCC)    type two   Dyspnea    GERD  (gastroesophageal reflux disease)    Hypercholesteremia    Hypertension    Iron  deficiency anemia    Pancreatitis    Peripheral neuropathy    Refusal of blood transfusions as patient is Jehovah's Witness    Squamous cell skin cancer     Past Surgical History:  Procedure Laterality Date   APPENDECTOMY  1957   BREAST EXCISIONAL BIOPSY Right 1966   CATARACT EXTRACTION, BILATERAL     CHOLECYSTECTOMY  10/2008   lump removed right breast  1958   bENIGN   right ankle plate  8018   TOTAL HIP ARTHROPLASTY Left 07/13/2023   Procedure: TOTAL HIP ARTHROPLASTY ANTERIOR APPROACH;  Surgeon: Fidel Rogue, MD;  Location: [facility];  Service: Orthopedics;  Laterality: Left;   VAGINAL HYSTERECTOMY  1978    Family History  Problem Relation Age of Onset   Hypertension Mother    Stroke Mother    Diabetes Mother    Colon cancer Mother 53   Cerebral aneurysm Father    Colon cancer Sister        in her 78s   CAD Sister    Heart attack Sister    Uterine cancer Sister        in her late 63s   Colon cancer Sister        in her 38s   Parkinsonism Sister    Dementia Sister    Heart attack Brother    Heart attack Brother    Throat cancer Brother    Atrial fibrillation Brother    Breast cancer Neg Hx     Social History   Socioeconomic History   Marital status: Widowed    Spouse name: Not on file   Number of children: 3   Years of education: Not on file   Highest education level: Associate degree: occupational, Scientist, product/process development, or vocational program  Occupational History   Occupation: Retired  Tobacco Use   Smoking status: Never   Smokeless tobacco: Never  Vaping Use   Vaping status: Never Used  Substance and Sexual Activity   Alcohol  use: No    Alcohol /week: 0.0 standard drinks of alcohol    Drug use: No   Sexual activity: Not Currently  Other Topics Concern   Not on file  Social History Narrative   Not on file   Social Drivers of Health   Financial Resource Strain: Low Risk   (12/31/2023)   Overall Financial Resource Strain (CARDIA)    Difficulty of Paying Living Expenses: Not very hard  Food Insecurity: No Food Insecurity (12/31/2023)   Hunger Vital Sign    Worried About Running Out of Food in the Last Year: Never true    Ran Out of Food in the Last Year: Never true  Transportation Needs: No Transportation Needs (12/31/2023)   PRAPARE - Administrator, Civil Service (Medical): No    Lack of Transportation (Non-Medical): No  Physical Activity: Insufficiently Active (12/31/2023)   Exercise Vital Sign    Days of Exercise per Week: 2 days    Minutes of Exercise per Session: 20 min  Stress: No Stress Concern Present (12/31/2023)   [employer] of Occupational Health - Occupational Stress  Questionnaire    [name] : Only a little  Social Connections: Moderately Integrated (09/28/2023)   Social Connection and Isolation Panel    Frequency of Communication with Friends and Family: More than three times a week    Frequency of Social Gatherings with Friends and Family: Three times a week    Attends Religious Services: More than 4 times per year    Active Member of Clubs or Organizations: Yes    Attends Banker Meetings: More than 4 times per year    Marital Status: Widowed  Intimate Partner Violence: Not At Risk (09/28/2023)   Humiliation, Afraid, Rape, and Kick questionnaire    Fear of Current or Ex-Partner: No    Emotionally Abused: No    Physically Abused: No    Sexually Abused: No    ROS Review of Systems  All other systems reviewed and are negative.   Objective:   Today's Vitals: BP 128/76 (Cuff Size: Large)   Pulse 100   Temp 98.1 F (36.7 C) (Oral)   Resp 15   Ht 5' 4.5 (1.638 m)   Wt 173 lb 4.8 oz (78.6 kg)   SpO2 99%   BMI 29.29 kg/m   Physical Exam Constitutional:      Appearance: Normal appearance.  HENT:     Head: Normocephalic and atraumatic.  Eyes:     Conjunctiva/sclera: Conjunctivae normal.   Cardiovascular:     Rate and Rhythm: Normal rate. Rhythm irregular.  Pulmonary:     Effort: Pulmonary effort is normal.     Breath sounds: Normal breath sounds.  Skin:    General: Skin is warm and dry.  Neurological:     General: No focal deficit present.     Mental Status: She is alert. Mental status is at baseline.  Psychiatric:        Mood and Affect: Mood normal.        Behavior: Behavior normal.     Assessment & Plan:   Assessment & Plan Dysphagia Intermittent sensation of food and large pills getting stuck during swallowing, particularly with meats and breads. Possible esophageal webs or inefficiency in swallowing mechanism considered. Conservative approach preferred due to risks associated with endoscopy. - Refer to speech therapy for swallow evaluation. - Advise to continue cutting food into small pieces and chewing thoroughly. - Advise to avoid sticky or hard-to-swallow foods. - Advise to sit upright while eating.  Orthopnea and exertional dyspnea Shortness of breath when lying flat and with exertion. Atrial fibrillation present. Possible heart failure considered. - Order echocardiogram to assess heart function. - Discuss results with cardiologist.  Atrial fibrillation Episodes of shortness of breath. Consideration of Watchman procedure to reduce stroke risk and discontinue Xarelto . Cardiologist reported good outcomes with the procedure. - Discuss Watchman procedure with cardiologist. - Order echocardiogram to assess heart function.  Type 2 diabetes mellitus with diabetic polyneuropathy Concerns about potential increase in A1c levels. Issues with Lantus  insulin  supply due to backorder. Previous use of insulin  pen noted, but insurance coverage uncertain. - Order A1c test. - Order kidney, liver, and electrolyte tests. - Order vitamin D  and B12 tests. - Switch Lantus  to pen form and check insurance coverage. - Consider alternative insulin  (Levemir ) if Lantus  pen is  not covered. - Refill metformin  prescription. - Refill test strips prescription.  General Health Maintenance Routine health maintenance discussed, including flu vaccination. - Administer flu shot.  - CBC w/Diff/Platelet - Comprehensive Metabolic Panel (CMET) - HgB A1c - insulin  glargine (  LANTUS  SOLOSTAR) 100 UNIT/ML Solostar Pen; Inject 30 Units into the skin daily.  Dispense: 15 mL; Refill: PRN - metFORMIN  (GLUCOPHAGE ) 1000 MG tablet; Take 1 tablet (1,000 mg total) by mouth 2 (two) times daily.  Dispense: 180 tablet; Refill: 1 - glucose blood (ACCU-CHEK GUIDE TEST) test strip; Use as instructed  Dispense: 100 each; Refill: 12 - Lipid Profile - atorvastatin  (LIPITOR) 10 MG tablet; Take 1 tablet (10 mg total) by mouth daily.  Dispense: 90 tablet; Refill: 1 - Vitamin D  (25 hydroxy) - Vitamin B12 - Ambulatory referral to Speech Therapy - ECHOCARDIOGRAM COMPLETE; Future   Follow-up: Return in about 3 months (around 10/03/2024).   Sharyle Fischer, DO

## 2024-07-04 NOTE — Progress Notes (Signed)
 ANNUAL DIABETIC FOOT EXAM  Subjective: Danielle [name] presents today for annual diabetic foot exam. She states her left great toe was sore under her nail. She does recall wearing a pair of dress shoes. Chief Complaint  Patient presents with   Nail Problem    Left great toe has a black fungus on it per patient   Patient confirms h/o diabetes.  Patient denies any h/o foot wounds.  Patient has been diagnosed with neuropathy.  Danielle Fend, DO is patient's PCP.  Past Medical History:  Diagnosis Date   Anemia    Anxiety    Arrhythmia    Arthritis    Atrial fibrillation (HCC)    Cataract    bilateral   Colon polyps    Diabetes mellitus without complication (HCC)    type two   Dyspnea    GERD (gastroesophageal reflux disease)    Hypercholesteremia    Hypertension    Iron  deficiency anemia    Pancreatitis    Peripheral neuropathy    Refusal of blood transfusions as patient is Jehovah's Witness    Squamous cell skin cancer    Patient Active Problem List   Diagnosis Date Noted   Type 2 diabetes mellitus with diabetic neuropathy, unspecified (HCC) 07/01/2024   Primary osteoarthritis of left hip 07/13/2023   S/P total left hip arthroplasty 07/13/2023   Anxiety disorder 04/21/2022   Body mass index (BMI) 31.0-31.9, adult 04/21/2022   Hearing loss 04/21/2022   Psychophysiologic insomnia 04/21/2022   Screening for malignant neoplasm of colon 04/21/2022   Type 2 diabetes mellitus with hyperglycemia (HCC) 04/21/2022   History of gout 02/24/2022   Atrial fibrillation (HCC) 11/26/2021   Decreased estrogen level 11/26/2021   Type 2 diabetes mellitus with diabetic polyneuropathy, with long-term current use of insulin  (HCC) 11/26/2021   Essential hypertension 11/26/2021   Disorder of bone 11/26/2021   Long term (current) use of insulin  (HCC) 11/26/2021   Hyperlipidemia 11/26/2021   GERD (gastroesophageal reflux disease) 11/26/2021   Vitamin B12 deficient megaloblastic  anemia 09/08/2021   Iron  deficiency anemia due to chronic blood loss 04/27/2021   Asymmetric SNHL (sensorineural hearing loss) 12/05/2018   Tinnitus of both ears 12/05/2018   Past Surgical History:  Procedure Laterality Date   APPENDECTOMY  1957   BREAST EXCISIONAL BIOPSY Right 1966   CATARACT EXTRACTION, BILATERAL     CHOLECYSTECTOMY  10/2008   lump removed right breast  1958   bENIGN   right ankle plate  8018   TOTAL HIP ARTHROPLASTY Left 07/13/2023   Procedure: TOTAL HIP ARTHROPLASTY ANTERIOR APPROACH;  Surgeon: Fidel Rogue, MD;  Location: [facility];  Service: Orthopedics;  Laterality: Left;   VAGINAL HYSTERECTOMY  1978   Current Outpatient Medications on File Prior to Visit  Medication Sig Dispense Refill   Accu-Chek Softclix Lancets lancets Use as instructed 100 each 12   atorvastatin  (LIPITOR) 10 MG tablet Take 1 tablet (10 mg total) by mouth daily. 90 tablet 1   Blood Glucose Monitoring Suppl (ACCU-CHEK GUIDE) w/Device KIT DxE11.65 Check BG tid 1 kit 0   calcium  carbonate (OSCAL) 1500 (600 Ca) MG TABS tablet Take 600 mg of elemental calcium  by mouth 2 (two) times daily with a meal.     Cholecalciferol (VITAMIN D3) 1000 UNITS CAPS Take 1,000 Units by mouth in the morning and at bedtime.     colchicine  0.6 MG tablet TAKE 1 TABLET BY MOUTH ONCE DAILY AS NEEDED FOR GOUT FLARES 90 tablet 0  cyanocobalamin  (,VITAMIN B-12,) 1000 MCG/ML injection Inject 1,000 mcg into the muscle every 30 (thirty) days.     diltiazem  (DILT-XR) 120 MG 24 hr capsule Take 1 capsule (120 mg total) by mouth daily. 90 capsule 0   EMBECTA INSULIN  SYR ULTRAFINE 31G X 15/64 0.5 ML MISC USE 1 SYRINGE ONCE DAILY 100 each 0   gabapentin  (NEURONTIN ) 300 MG capsule TAKE 3 CAPSULES BY MOUTH AT BEDTIME 270 capsule 0   glucose blood (ACCU-CHEK GUIDE TEST) test strip Use as instructed 100 each 12   Insulin  Syringe-Needle U-100 (B-D INS SYR ULTRAFINE .5CC/30G) 30G X 1/2 0.5 ML MISC 1 each by Does not apply route daily.  100 each 3   LANTUS  100 UNIT/ML injection INJECT 24-26 UNITS SUBCUTANEOUSLY AT BEDTIME (DISCARD REMAINDER OF BOTTLE AFTER 28 DAYS) 10 mL 0   metFORMIN  (GLUCOPHAGE ) 1000 MG tablet Take 1 tablet (1,000 mg total) by mouth 2 (two) times daily. 180 tablet 1   metoprolol  succinate (TOPROL -XL) 50 MG 24 hr tablet Take 1 tablet (50 mg total) by mouth daily. Take with or immediately following a meal. 180 tablet 2   omeprazole  (PRILOSEC) 40 MG capsule Take 1 capsule by mouth once daily 90 capsule 0   Polyethyl Glycol-Propyl Glycol (SYSTANE) 0.4-0.3 % SOLN Place 1-2 drops into both eyes 3 (three) times daily as needed (dry/irritated eyes).     rivaroxaban  (XARELTO ) 20 MG TABS tablet TAKE 1 TABLET BY MOUTH ONCE DAILY WITH SUPPER 90 tablet 1   No current facility-administered medications on file prior to visit.    Allergies  Allergen Reactions   Amoxicillin Swelling and Rash    Throat swells Tolerated Cephalosporin Date: 07/13/2023.     Hydrochlorothiazide Other (See Comments)    HA and Leg cramps   Shellfish Allergy    Shrimp (Diagnostic) Other (See Comments)    Gout flares   Cleocin [Clindamycin Hcl] Rash   Codeine Rash    And edema   Penicillins Rash    Tolerated Cephalosporin Date: 07/13/2023.     Social History   Occupational History   Occupation: Retired  Tobacco Use   Smoking status: Never   Smokeless tobacco: Never  Vaping Use   Vaping status: Never Used  Substance and Sexual Activity   Alcohol  use: No    Alcohol /week: 0.0 standard drinks of alcohol    Drug use: No   Sexual activity: Not Currently   Family History  Problem Relation Age of Onset   Hypertension Mother    Stroke Mother    Diabetes Mother    Colon cancer Mother 68   Cerebral aneurysm Father    Colon cancer Sister        in her 78s   CAD Sister    Heart attack Sister    Uterine cancer Sister        in her late 33s   Colon cancer Sister        in her 35s   Parkinsonism Sister    Dementia Sister    Heart  attack Brother    Heart attack Brother    Throat cancer Brother    Atrial fibrillation Brother    Breast cancer Neg Hx    Immunization History  Administered Date(s) Administered   Fluad Quad(high Dose 65+) 08/23/2022   Fluzone  Influenza virus vaccine,trivalent (IIV3), split virus 07/27/2011, 10/09/2013   INFLUENZA, HIGH DOSE SEASONAL PF 08/30/2021   Influenza Split 09/29/2007, 10/22/2008, 06/30/2009   Influenza,inj,Quad PF,6+ Mos 08/03/2016, 08/07/2017, 08/22/2018, 08/27/2019, 08/18/2020  Influenza,inj,quad, With Preservative 07/22/2015   PFIZER(Purple Top)SARS-COV-2 Vaccination 01/24/2020, 02/14/2020   PNEUMOCOCCAL CONJUGATE-20 11/30/2022   Pneumococcal Conjugate-13 01/03/2014   Pneumococcal Polysaccharide-23 05/10/2005   Td 10/07/2002   Tdap 11/16/2011     Review of Systems: Negative except as noted in the HPI.   Objective: There were no vitals filed for this visit.  Danielle [name] is a pleasant 89 y.o. female in NAD. AAO X 3.  Diabetic foot exam was performed with the following findings:   Vascular Examination: Capillary refill time immediate b/l. Vascular status intact b/l with palpable DP pulses; faintly palpable PT pulses. Pedal hair absent b/l. No edema. No pain with calf compression b/l. Skin temperature gradient WNL b/l. No cyanosis or clubbing noted b/l. No ischemia or gangrene b/l.   Neurological Examination: Sensation grossly intact b/l with 10 gram monofilament. Vibratory sensation intact b/l. Pt has subjective symptoms of neuropathy.  Dermatological Examination: Evidence of old subungual hematoma left great toe. No erythema, no edema, no drainage, no fluctuance. Pedal skin with normal turgor, texture and tone b/l.  No open wounds. No interdigital macerations.   Toenails 1-5 b/l thick, discolored, elongated with subungual debris and pain on dorsal palpation.   Hyperkeratotic lesion(s) submet head 1 b/l and distal tip right 2nd digit.  No erythema, no edema, no  drainage, no fluctuance.  Musculoskeletal Examination: Muscle strength 5/5 to all lower extremity muscle groups bilaterally. Hammertoe(s) left great toe and right great toe.  Radiographs: None     Lab Results  Component Value Date   HGBA1C 7.2 (A) 01/01/2024   ADA Risk Categorization: Low Risk :  Patient has all of the following: Intact protective sensation No prior foot ulcer  No severe deformity Pedal pulses present  Assessment: 1. Pain due to onychomycosis of toenails of both feet   2. Callus   3. Acquired hammertoes of both feet   4. Subungual contusion of toenail of left foot, initial encounter   5. Diabetic peripheral neuropathy associated with type 2 diabetes mellitus (HCC)   6. Encounter for diabetic foot exam (HCC)     Plan: Diabetic foot examination performed. All patient's and/or POA's questions/concerns addressed on today's visit. Nail plate left hallux still adhered. Monitor Dispensed tube foam for left great toe. Mycotic toenails 1-5 debrided in length and girth without incident. Dispensed toe crest for right 2nd digit for distal lesion. Wear every other day. Callus(es) submet head 1 b/l pared with sharp debridement without incident. Continue daily foot inspections and monitor blood glucose per PCP/Endocrinologist's recommendations. Continue soft, supportive shoe gear daily. Report any pedal injuries to medical professional. Call office if there are any questions/concerns. Return in about 3 months (around 09/30/2024).  Danielle [name], DPM      [facility] LOCATION: [address] [place] [address]                   Office [phone]   [facility] LOCATION: [address] [place], [place] [address] Office [phone]

## 2024-07-05 ENCOUNTER — Ambulatory Visit: Payer: Self-pay | Admitting: Internal Medicine

## 2024-07-05 ENCOUNTER — Other Ambulatory Visit: Payer: Self-pay | Admitting: Internal Medicine

## 2024-07-05 ENCOUNTER — Telehealth: Payer: Self-pay

## 2024-07-05 DIAGNOSIS — E1142 Type 2 diabetes mellitus with diabetic polyneuropathy: Secondary | ICD-10-CM

## 2024-07-05 LAB — VITAMIN B12: Vitamin B-12: 374 pg/mL (ref 200–1100)

## 2024-07-05 LAB — CBC WITH DIFFERENTIAL/PLATELET
Absolute Lymphocytes: 1863 {cells}/uL (ref 850–3900)
Absolute Monocytes: 415 {cells}/uL (ref 200–950)
Basophils Absolute: 41 {cells}/uL (ref 0–200)
Basophils Relative: 0.6 %
Eosinophils Absolute: 279 {cells}/uL (ref 15–500)
Eosinophils Relative: 4.1 %
HCT: 39.8 % (ref 35.0–45.0)
Hemoglobin: 13.2 g/dL (ref 11.7–15.5)
MCH: 33.1 pg — ABNORMAL HIGH (ref 27.0–33.0)
MCHC: 33.2 g/dL (ref 32.0–36.0)
MCV: 99.7 fL (ref 80.0–100.0)
MPV: 11.5 fL (ref 7.5–12.5)
Monocytes Relative: 6.1 %
Neutro Abs: 4202 {cells}/uL (ref 1500–7800)
Neutrophils Relative %: 61.8 %
Platelets: 259 Thousand/uL (ref 140–400)
RBC: 3.99 Million/uL (ref 3.80–5.10)
RDW: 12 % (ref 11.0–15.0)
Total Lymphocyte: 27.4 %
WBC: 6.8 Thousand/uL (ref 3.8–10.8)

## 2024-07-05 LAB — COMPREHENSIVE METABOLIC PANEL WITH GFR
AG Ratio: 1.5 (calc) (ref 1.0–2.5)
ALT: 15 U/L (ref 6–29)
AST: 14 U/L (ref 10–35)
Albumin: 4 g/dL (ref 3.6–5.1)
Alkaline phosphatase (APISO): 56 U/L (ref 37–153)
BUN: 13 mg/dL (ref 7–25)
CO2: 30 mmol/L (ref 20–32)
Calcium: 9 mg/dL (ref 8.6–10.4)
Chloride: 101 mmol/L (ref 98–110)
Creat: 0.78 mg/dL (ref 0.60–0.95)
Globulin: 2.6 g/dL (ref 1.9–3.7)
Glucose, Bld: 270 mg/dL — ABNORMAL HIGH (ref 65–99)
Potassium: 4.7 mmol/L (ref 3.5–5.3)
Sodium: 139 mmol/L (ref 135–146)
Total Bilirubin: 0.6 mg/dL (ref 0.2–1.2)
Total Protein: 6.6 g/dL (ref 6.1–8.1)
eGFR: 73 mL/min/1.73m2 (ref >=60)

## 2024-07-05 LAB — HEMOGLOBIN A1C
Hgb A1c MFr Bld: 7.1 % — ABNORMAL HIGH (ref <5.7)
Mean Plasma Glucose: 157 mg/dL
eAG (mmol/L): 8.7 mmol/L

## 2024-07-05 LAB — LIPID PANEL
Cholesterol: 116 mg/dL (ref <200)
HDL: 39 mg/dL — ABNORMAL LOW (ref >=50)
Non-HDL Cholesterol (Calc): 77 mg/dL (ref <130)
Total CHOL/HDL Ratio: 3 (calc) (ref <5.0)
Triglycerides: 433 mg/dL — ABNORMAL HIGH (ref <150)

## 2024-07-05 LAB — VITAMIN D 25 HYDROXY (VIT D DEFICIENCY, FRACTURES): Vit D, 25-Hydroxy: 64 ng/mL (ref 30–100)

## 2024-07-05 MED ORDER — INSULIN DETEMIR 100 UNIT/ML ~~LOC~~ SOLN: 30.0000 [IU] | Freq: Every day | SUBCUTANEOUS | 10 refills | Status: DC

## 2024-07-05 NOTE — Telephone Encounter (Signed)
 Copied from CRM [id number]. Topic: Clinical - Prescription Issue >> Jul 05, 2024 11:30 AM Nathanel BROCKS wrote: Reason for CRM: pt stated that the insulin  glargine (LANTUS  SOLOSTAR) 100 UNIT/ML Solostar Pen is going to cost 70.00 and she has normally pays the 30.00. Pt states that she can't afford it and would Dr Bernardo call in the vial instead for another insulin  brand. Please advise pt.

## 2024-07-08 ENCOUNTER — Ambulatory Visit: Attending: Internal Medicine | Admitting: Speech Pathology

## 2024-07-08 DIAGNOSIS — R1310 Dysphagia, unspecified: Secondary | ICD-10-CM | POA: Insufficient documentation

## 2024-07-08 NOTE — Therapy (Signed)
 OUTPATIENT SPEECH LANGUAGE PATHOLOGY  SWALLOW EVALUATION   Patient Name: Danielle [name] MRN: [id number] DOB:[birth year], 89 y.o., female Today's Date: 07/09/2024  PCP: Sharyle Fischer, DO REFERRING PROVIDER: Sharyle Fischer, DO   End of Session - 07/08/24 1342     Visit Number 1    Number of Visits 13    Date for SLP Re-Evaluation 09/30/24    Authorization Type Medicare Part !Deeann Part B    Progress Note Due on Visit 10    SLP Start Time 1315    SLP Stop Time  1400    SLP Time Calculation (min) 45 min    Activity Tolerance Patient tolerated treatment well          Past Medical History:  Diagnosis Date   Anemia    Anxiety    Arrhythmia    Arthritis    Atrial fibrillation (HCC)    Cataract    bilateral   Colon polyps    Diabetes mellitus without complication (HCC)    type two   Dyspnea    GERD (gastroesophageal reflux disease)    Hypercholesteremia    Hypertension    Iron  deficiency anemia    Pancreatitis    Peripheral neuropathy    Refusal of blood transfusions as patient is Jehovah's Witness    Squamous cell skin cancer    Past Surgical History:  Procedure Laterality Date   APPENDECTOMY  1957   BREAST EXCISIONAL BIOPSY Right 1966   CATARACT EXTRACTION, BILATERAL     CHOLECYSTECTOMY  10/2008   lump removed right breast  1958   bENIGN   right ankle plate  8018   TOTAL HIP ARTHROPLASTY Left 07/13/2023   Procedure: TOTAL HIP ARTHROPLASTY ANTERIOR APPROACH;  Surgeon: Fidel Rogue, MD;  Location: [facility];  Service: Orthopedics;  Laterality: Left;   VAGINAL HYSTERECTOMY  1978   Patient Active Problem List   Diagnosis Date Noted   Type 2 diabetes mellitus with diabetic neuropathy, unspecified (HCC) 07/01/2024   Primary osteoarthritis of left hip 07/13/2023   S/P total left hip arthroplasty 07/13/2023   Anxiety disorder 04/21/2022   Body mass index (BMI) 31.0-31.9, adult 04/21/2022   Hearing loss 04/21/2022   Psychophysiologic insomnia 04/21/2022    Screening for malignant neoplasm of colon 04/21/2022   Type 2 diabetes mellitus with hyperglycemia (HCC) 04/21/2022   History of gout 02/24/2022   Atrial fibrillation (HCC) 11/26/2021   Decreased estrogen level 11/26/2021   Type 2 diabetes mellitus with diabetic polyneuropathy, with long-term current use of insulin  (HCC) 11/26/2021   Essential hypertension 11/26/2021   Disorder of bone 11/26/2021   Long term (current) use of insulin  (HCC) 11/26/2021   Hyperlipidemia 11/26/2021   GERD (gastroesophageal reflux disease) 11/26/2021   Vitamin B12 deficient megaloblastic anemia 09/08/2021   Iron  deficiency anemia due to chronic blood loss 04/27/2021   Asymmetric SNHL (sensorineural hearing loss) 12/05/2018   Tinnitus of both ears 12/05/2018    ONSET DATE: 8 to 9 months ago; Date of referral 07/04/2024   REFERRING DIAG: REF101 - AMB REFERRAL TO SPEECH THERAPY   THERAPY DIAG:  Dysphagia, unspecified type  Rationale for Evaluation and Treatment Rehabilitation  SUBJECTIVE:   SUBJECTIVE STATEMENT: Pt pleasant, good historian, reports impact on swallow deficits on social life Pt accompanied by: self  PERTINENT HISTORY and DIAGNOSTIC FINDINGS: Danielle [name] is an 89 year old female with dysphagia and atrial fibrillation who presents with difficulty swallowing and shortness of breath.  A.Fib, HLD, Type 2 Diabetes, iron  deficiency, sensorineural  hearing loss in L ear, gout.   Per chart, pt presents with difficulty swallowing and shortness of breath. She experiences difficulty swallowing, particularly with large pills and certain foods like steak and bread. There is a sensation of food getting stuck, requiring extensive chewing before swallowing. She manages liquids without issue. This problem began earlier this year. Intermittent sensation of food and large pills getting stuck during swallowing, particularly with meats and breads. Possible esophageal webs or inefficiency in swallowing  mechanism considered. Conservative approach preferred due to risks associated with endoscopy.  Her family history includes a younger sister who has had esophageal stretching procedures.   Per chart review, pt with report of some intermittent solid food dysphagia (04/16/2021). Upper endoscopy on 04/16/2021 revealed small sliding hiatal hernia.   PAIN:  Are you having pain? No  FALLS: Has patient fallen in last 6 months?  No  LIVING ENVIRONMENT: Lives with: lives alone Lives in: House/apartment  PLOF:  Level of assistance: Independent with ADLs, Independent with IADLs Employment: Retired   PATIENT GOALS  to decrease choking with PO intake  OBJECTIVE:   COGNITION: Overall cognitive status: Within functional limits for tasks assessed Areas of impairment:  n/a   ORAL MOTOR EXAMINATION Facial : WFL Lingual: WFL Velum: WFL Mandible: WFL Cough: WFL Voice: WFL   CLINICAL SWALLOW ASSESSMENT:   Current diet: regular, thin liquids Dentition: adequate natural dentition Feeding: able to feed self Consistencies tested: Thin Liquid: Presentation: Cup and Self-fed Oral Phase: WFL Pharyngeal Phase: WFL Puree: Presentation: Spoon and Self-fed Oral Phase: WFL Pharyngeal Phase: WFL Regular: Presentation: By hand and Self-fed Oral Phase: WFL Pharyngeal Phase: WFL   Evaluation findings: Patient presents with oropharyngeal swallow which appears clinically to be within functional limits with adequate airway protection. Oral stage is characterized by appearance of adequate oral containment, mastication, bolus formation, oral transfer and oral clearance. Swallow initiation appears timely. No overt signs of aspiration observed.    Aspiration risk factors:History of GERD and History of esophageal-related issues Overall aspiration risk:No limitations Diet Recommendations: regular and thin liquids Precautions:Minimize environmental distractions, Slow rate, Small sips/bites, Seated upright  90 degrees, and Remain upright for at least 30 minutes after meals Supervision: Patient able to feed self Oral care recommendations:Oral care BID Follow-up recommendations: Therapy as outlined in treatment plan below     PATIENT REPORTED OUTCOME MEASURES (PROM): Reflux Symptom Index These are statements many people have used to describe their voices and the effects of their voices on their lives. In the last  month, how did the following problems affect you?   Hoarseness or a problem with your voice 4=Severe problem  Clearing your throat 4=Severe problem  Excess throat mucous or postnasal drip 5=Problem as bad as it can be Difficulty swallowing food, liquids, or pills 5=Problem as bad as it can be Coughing after you eat or after lying down 4=Severe problem  Breathing difficulties or choking episodes 4=Severe problem  Troublesome or annoying cough 0=No problem  Sensation of something sticking in your throat or a lump in your throat 4=Severe problem  Heartburn, chest pain, indigestion, or stomach acid coming up 4=Severe problem   Total: 34    Normative data suggests that an RSI of greater than or equal to 13 is clinically significant  Therefore, an RSI > 13 may be indicative of significant reflux disease.   EATING ASSESSMENT TOOL (EAT-10)   The patient was asked to rate to what extent the following statements are problematic on a scale of 0-4. 0 =  No problem; 4 = Severe problem. A total score of 3 or higher is considered abnormal.  1.) My swallowing problem has caused me to lose weight. 0 2.) My swallowing problem interferes with my ability to go out for meals. 3 3.) Swallowing liquids takes extra effort. 0 4.) Swallowing solids takes extra effort. 4   5.) Swallowing pills takes extra effort. 4  6.) Swallowing is painful. 3 7.) The pleasure of eating is affected by my swallowing. 3 8.) When I swallow food sticks in my throat. 3 9.) I cough when I eat. 4  10.) Swallowing is  stressful. 4    TOTAL SCORE: 28    TODAY'S TREATMENT:  Skilled verbal and written information provided on taking pills one tablet at a time (instead of pt report of 3), also recommend taking pills with applesauce, aspiration and reflux precautions provided   PATIENT EDUCATION: Education details: results of this assessment, ST POC Person educated: Patient Education method: Explanation and Handouts Education comprehension: needs further education  HOME EXERCISE PROGRAM:     As above   GOALS: Goals reviewed with patient? Yes  SHORT TERM GOALS: Target date: 10 sessions  Patient will participate in objective swallowing evaluation (MBSS) to identify safest diet recommendation as well as therapeutic targets.  Baseline: Goal status: INITIAL  2.  With Mod I, pt will demonstrate understanding of result and recommendations of MBS by verbalizing salient points.   Baseline:  Goal status: INITIAL  3.  With Mod I, pt will demonstrate the ability to adequately self-monitor swallowing skills and perform appropriate compensatory techniques to reduce s/s of aspiration and to safely consume least restrictive diet.     Baseline:  Goal status: INITIAL   LONG TERM GOALS: Target date: 09/30/2024  With Mod I, pt will utilize safe swallowing strategies in order to reduce risk of aspiration, dehydration and weight loss with consuming regular textures and thin liquids. Baseline:  Goal status: INITIAL  ASSESSMENT:  CLINICAL IMPRESSION: Patient is a 89 y.o. female who was seen today for a clinical swallow evaluation d/t report of choking when consuming food (not with liquids). She describes coughing/throat clearing/choking at non eating times and following meals (not snacks), inconsistently taking her PPI, changes in vocal quality (especially in the morning), post nasal drip, waking up in the night d/t coughing (potentially related to A-fib), difficulty when consuming pills and familial history of GI  problems.     Recommend follow up treatment for instruction in aspiration, reflux precautions as well as potentially completing an instrumental swallow study.   OBJECTIVE IMPAIRMENTS include dysphagia. These impairments are limiting patient from safety when swallowing. Factors affecting potential to achieve goals and functional outcome are N/A. Patient will benefit from skilled SLP services to address above impairments and improve overall function.  REHAB POTENTIAL: Good  PLAN: SLP FREQUENCY: 1-2x/week  SLP DURATION: 12 weeks  PLANNED INTERVENTIONS: Aspiration precaution training, Diet toleration management , Trials of upgraded texture/liquids, SLP instruction and feedback, Compensatory strategies, and Patient/family education     Ellington Cornia B. Rubbie, M.S., CCC-SLP, Tree surgeon Certified Brain Injury Specialist [facility]  [facility] Office [phone] Ascom [phone] Fax [phone]

## 2024-07-10 ENCOUNTER — Other Ambulatory Visit: Payer: Self-pay | Admitting: Cardiovascular Disease

## 2024-07-16 ENCOUNTER — Ambulatory Visit: Admitting: Speech Pathology

## 2024-07-16 DIAGNOSIS — R1310 Dysphagia, unspecified: Secondary | ICD-10-CM | POA: Diagnosis not present

## 2024-07-16 NOTE — Therapy (Signed)
 OUTPATIENT SPEECH LANGUAGE PATHOLOGY  TREATMENT AND DISCHARGE SUMMARY   Patient Name: Danielle [name] MRN: [id number] DOB:1935-03-27, 89 y.o., female Today's Date: 07/16/2024  PCP: Sharyle Fischer, DO REFERRING PROVIDER: Sharyle Fischer, DO   End of Session - 07/16/24 1513     Visit Number 2    Number of Visits 13    Date for Recertification  09/30/24    Authorization Type Medicare Part !Deeann Part B    Progress Note Due on Visit 10    SLP Start Time 1315    SLP Stop Time  1335    SLP Time Calculation (min) 20 min    Activity Tolerance Patient tolerated treatment well          Past Medical History:  Diagnosis Date   Anemia    Anxiety    Arrhythmia    Arthritis    Atrial fibrillation (HCC)    Cataract    bilateral   Colon polyps    Diabetes mellitus without complication (HCC)    type two   Dyspnea    GERD (gastroesophageal reflux disease)    Hypercholesteremia    Hypertension    Iron  deficiency anemia    Pancreatitis    Peripheral neuropathy    Refusal of blood transfusions as patient is Jehovah's Witness    Squamous cell skin cancer    Past Surgical History:  Procedure Laterality Date   APPENDECTOMY  1957   BREAST EXCISIONAL BIOPSY Right 1966   CATARACT EXTRACTION, BILATERAL     CHOLECYSTECTOMY  10/2008   lump removed right breast  1958   bENIGN   right ankle plate  8018   TOTAL HIP ARTHROPLASTY Left 07/13/2023   Procedure: TOTAL HIP ARTHROPLASTY ANTERIOR APPROACH;  Surgeon: Fidel Rogue, MD;  Location: [facility];  Service: Orthopedics;  Laterality: Left;   VAGINAL HYSTERECTOMY  1978   Patient Active Problem List   Diagnosis Date Noted   Type 2 diabetes mellitus with diabetic neuropathy, unspecified (HCC) 07/01/2024   Primary osteoarthritis of left hip 07/13/2023   S/P total left hip arthroplasty 07/13/2023   Anxiety disorder 04/21/2022   Body mass index (BMI) 31.0-31.9, adult 04/21/2022   Hearing loss 04/21/2022   Psychophysiologic insomnia  04/21/2022   Screening for malignant neoplasm of colon 04/21/2022   Type 2 diabetes mellitus with hyperglycemia (HCC) 04/21/2022   History of gout 02/24/2022   Atrial fibrillation (HCC) 11/26/2021   Decreased estrogen level 11/26/2021   Type 2 diabetes mellitus with diabetic polyneuropathy, with long-term current use of insulin  (HCC) 11/26/2021   Essential hypertension 11/26/2021   Disorder of bone 11/26/2021   Long term (current) use of insulin  (HCC) 11/26/2021   Hyperlipidemia 11/26/2021   GERD (gastroesophageal reflux disease) 11/26/2021   Vitamin B12 deficient megaloblastic anemia 09/08/2021   Iron  deficiency anemia due to chronic blood loss 04/27/2021   Asymmetric SNHL (sensorineural hearing loss) 12/05/2018   Tinnitus of both ears 12/05/2018    ONSET DATE: 8 to 9 months ago; Date of referral 07/04/2024   REFERRING DIAG: REF101 - AMB REFERRAL TO SPEECH THERAPY   THERAPY DIAG:  Dysphagia, unspecified type  Rationale for Evaluation and Treatment Rehabilitation  SUBJECTIVE:   SUBJECTIVE STATEMENT: Pt pleasant, good historian, reports impact on swallow deficits on social life Pt accompanied by: self  PERTINENT HISTORY and DIAGNOSTIC FINDINGS: Danielle [name] is an 89 year old female with dysphagia and atrial fibrillation who presents with difficulty swallowing and shortness of breath.  A.Fib, HLD, Type 2 Diabetes, iron   deficiency, sensorineural hearing loss in L ear, gout.   Per chart, pt presents with difficulty swallowing and shortness of breath. She experiences difficulty swallowing, particularly with large pills and certain foods like steak and bread. There is a sensation of food getting stuck, requiring extensive chewing before swallowing. She manages liquids without issue. This problem began earlier this year. Intermittent sensation of food and large pills getting stuck during swallowing, particularly with meats and breads. Possible esophageal webs or inefficiency in  swallowing mechanism considered. Conservative approach preferred due to risks associated with endoscopy.  Her family history includes a younger sister who has had esophageal stretching procedures.   Per chart review, pt with report of some intermittent solid food dysphagia (04/16/2021). Upper endoscopy on 04/16/2021 revealed small sliding hiatal hernia.   PAIN:  Are you having pain? No  FALLS: Has patient fallen in last 6 months?  No  LIVING ENVIRONMENT: Lives with: lives alone Lives in: House/apartment  PLOF:  Level of assistance: Independent with ADLs, Independent with IADLs Employment: Retired   PATIENT GOALS  to decrease choking with PO intake  OBJECTIVE:   TODAY'S TREATMENT:  Pt returns to session with paper of previous recommendations. She reports taking her PPI and nasal spray on a daily basis, using steam/humidification helpful with mucus management in the morning and she further reports using applesauce with her pills as helpful in taking her medicine. At this time, pt doesn't have any other complaints/symptoms.    PATIENT EDUCATION: Education details: see above Person educated: Patient Education method: Chief Technology Officer Education comprehension: needs further education  HOME EXERCISE PROGRAM:     As above   GOALS: Goals reviewed with patient? Yes  SHORT TERM GOALS: Target date: 10 sessions  Patient will participate in objective swallowing evaluation (MBSS) to identify safest diet recommendation as well as therapeutic targets.  Baseline: Goal status: INITIAL: MET  2.  With Mod I, pt will demonstrate understanding of result and recommendations of MBS by verbalizing salient points.   Baseline:  Goal status: INITIAL: MET  3.  With Mod I, pt will demonstrate the ability to adequately self-monitor swallowing skills and perform appropriate compensatory techniques to reduce s/s of aspiration and to safely consume least restrictive diet.     Baseline:   Goal status: INITIAL: MET   LONG TERM GOALS: Target date: 09/30/2024  With Mod I, pt will utilize safe swallowing strategies in order to reduce risk of aspiration, dehydration and weight loss with consuming regular textures and thin liquids. Baseline:  Goal status: INITIAL: 07/16/2024  ASSESSMENT:  CLINICAL IMPRESSION: Pt presents for follow up dysphagia management. Pt reports iprove in symptoms when employing aspiration precautions and previously made recommendations. Pt states that she feels safer when eating now. She will seek reconsult to sT services should symptoms change.   REHAB POTENTIAL: Good  PLAN: Pt has met maximal benefit from services at this time.   Janaiya Beauchesne B. Rubbie, M.S., CCC-SLP, Tree surgeon Certified Brain Injury Specialist [facility]  [facility] Office [phone] Ascom [phone] Fax [phone]

## 2024-07-19 ENCOUNTER — Ambulatory Visit
Admission: RE | Admit: 2024-07-19 | Discharge: 2024-07-19 | Disposition: A | Discharge status: Home / Self Care | Source: Ambulatory Visit | Attending: Internal Medicine | Admitting: Internal Medicine

## 2024-07-19 DIAGNOSIS — I083 Combined rheumatic disorders of mitral, aortic and tricuspid valves: Secondary | ICD-10-CM | POA: Diagnosis not present

## 2024-07-19 DIAGNOSIS — R0601 Orthopnea: Secondary | ICD-10-CM | POA: Insufficient documentation

## 2024-07-19 DIAGNOSIS — R0600 Dyspnea, unspecified: Secondary | ICD-10-CM | POA: Diagnosis not present

## 2024-07-20 LAB — ECHOCARDIOGRAM COMPLETE
AR max vel: 1.86 cm2
AV Area VTI: 1.69 cm2
AV Area mean vel: 1.69 cm2
AV Mean grad: 4 mmHg
AV Peak grad: 6.2 mmHg
Ao pk vel: 1.24 m/s
Area-P 1/2: 3.28 cm2
Calc EF: 49.7 %
MV VTI: 1.2 cm2
S' Lateral: 2.7 cm
Single Plane A2C EF: 50.3 %
Single Plane A4C EF: 52.5 %

## 2024-07-23 ENCOUNTER — Ambulatory Visit (INDEPENDENT_AMBULATORY_CARE_PROVIDER_SITE_OTHER)

## 2024-07-23 DIAGNOSIS — E538 Deficiency of other specified B group vitamins: Secondary | ICD-10-CM

## 2024-07-23 MED ORDER — CYANOCOBALAMIN 1000 MCG/ML IJ SOLN
1000.0000 ug | Freq: Once | INTRAMUSCULAR | Status: AC
  Administered 2024-07-23: 1000 ug via INTRAMUSCULAR

## 2024-08-19 ENCOUNTER — Other Ambulatory Visit: Payer: Self-pay | Admitting: Internal Medicine

## 2024-08-19 DIAGNOSIS — K219 Gastro-esophageal reflux disease without esophagitis: Secondary | ICD-10-CM

## 2024-08-19 DIAGNOSIS — E1142 Type 2 diabetes mellitus with diabetic polyneuropathy: Secondary | ICD-10-CM

## 2024-08-21 ENCOUNTER — Ambulatory Visit

## 2024-08-21 DIAGNOSIS — E538 Deficiency of other specified B group vitamins: Secondary | ICD-10-CM

## 2024-08-21 MED ORDER — CYANOCOBALAMIN 1000 MCG/ML IJ SOLN
1000.0000 ug | Freq: Once | INTRAMUSCULAR | Status: AC
  Administered 2024-08-21: 1000 ug via INTRAMUSCULAR

## 2024-08-21 NOTE — Telephone Encounter (Signed)
 Requested Prescriptions  Pending Prescriptions Disp Refills   gabapentin  (NEURONTIN ) 300 MG capsule [Pharmacy Med Name: Gabapentin  300 MG Oral Capsule] 270 capsule 0    Sig: TAKE 3 CAPSULES BY MOUTH AT BEDTIME     Neurology: Anticonvulsants - gabapentin  Passed - 08/21/2024 11:53 AM      Passed - Cr in normal range and within 360 days    Creat  Date Value Ref Range Status  07/04/2024 0.78 0.60 - 0.95 mg/dL Final   Creatinine, Urine  Date Value Ref Range Status  11/30/2022 158 20 - 275 mg/dL Final         Passed - Completed PHQ-2 or PHQ-9 in the last 360 days      Passed - Valid encounter within last 12 months    Recent Outpatient Visits           1 month ago Type 2 diabetes mellitus with diabetic polyneuropathy, with long-term current use of insulin  [facility])   [place] [facility] Bernardo Fend, DO   7 months ago Type 2 diabetes mellitus with diabetic polyneuropathy, with long-term current use of insulin  [facility])   [place] [facility] Bernardo Fend, DO       Future Appointments             In 1 month Bernardo Fend, DO  [facility] [place]             omeprazole  (PRILOSEC) 40 MG capsule [place] Med Name: OMEPRAZOLE  DR 40MG   CAP] 90 capsule 0    Sig: Take 1 capsule by mouth once daily     Gastroenterology: Proton Pump Inhibitors Passed - 08/21/2024 11:53 AM      Passed - Valid encounter within last 12 months    Recent Outpatient Visits           1 month ago Type 2 diabetes mellitus with diabetic polyneuropathy, with long-term current use of insulin  [facility])    [facility] Bernardo Fend, DO   7 months ago Type 2 diabetes mellitus with diabetic polyneuropathy, with long-term current use of insulin  [facility])   [facility] Bernardo Fend, DO       Future Appointments             In 1 month Bernardo Fend, DO [facility]  [facility] [place]

## 2024-08-22 DIAGNOSIS — H3561 Retinal hemorrhage, right eye: Secondary | ICD-10-CM | POA: Diagnosis not present

## 2024-08-22 DIAGNOSIS — E119 Type 2 diabetes mellitus without complications: Secondary | ICD-10-CM | POA: Diagnosis not present

## 2024-08-22 DIAGNOSIS — H52203 Unspecified astigmatism, bilateral: Secondary | ICD-10-CM | POA: Diagnosis not present

## 2024-08-22 DIAGNOSIS — Z961 Presence of intraocular lens: Secondary | ICD-10-CM | POA: Diagnosis not present

## 2024-08-22 LAB — OPHTHALMOLOGY REPORT-SCANNED

## 2024-09-01 ENCOUNTER — Other Ambulatory Visit: Payer: Self-pay | Admitting: Cardiovascular Disease

## 2024-09-05 DIAGNOSIS — Z85828 Personal history of other malignant neoplasm of skin: Secondary | ICD-10-CM | POA: Diagnosis not present

## 2024-09-05 DIAGNOSIS — L812 Freckles: Secondary | ICD-10-CM | POA: Diagnosis not present

## 2024-09-05 DIAGNOSIS — L821 Other seborrheic keratosis: Secondary | ICD-10-CM | POA: Diagnosis not present

## 2024-09-05 DIAGNOSIS — L820 Inflamed seborrheic keratosis: Secondary | ICD-10-CM | POA: Diagnosis not present

## 2024-09-18 ENCOUNTER — Ambulatory Visit (INDEPENDENT_AMBULATORY_CARE_PROVIDER_SITE_OTHER)

## 2024-09-18 DIAGNOSIS — E538 Deficiency of other specified B group vitamins: Secondary | ICD-10-CM | POA: Diagnosis not present

## 2024-09-18 MED ORDER — CYANOCOBALAMIN 1000 MCG/ML IJ SOLN
1000.0000 ug | Freq: Once | INTRAMUSCULAR | Status: AC
  Administered 2024-09-18: 1000 ug via INTRAMUSCULAR

## 2024-09-28 ENCOUNTER — Other Ambulatory Visit: Payer: Self-pay | Admitting: Cardiovascular Disease

## 2024-09-28 DIAGNOSIS — I4821 Permanent atrial fibrillation: Secondary | ICD-10-CM

## 2024-09-30 ENCOUNTER — Ambulatory Visit: Admitting: Podiatry

## 2024-09-30 ENCOUNTER — Encounter: Payer: Self-pay | Admitting: Podiatry

## 2024-09-30 DIAGNOSIS — M79674 Pain in right toe(s): Secondary | ICD-10-CM | POA: Diagnosis not present

## 2024-09-30 DIAGNOSIS — M79675 Pain in left toe(s): Secondary | ICD-10-CM | POA: Diagnosis not present

## 2024-09-30 DIAGNOSIS — L84 Corns and callosities: Secondary | ICD-10-CM | POA: Diagnosis not present

## 2024-09-30 DIAGNOSIS — E1142 Type 2 diabetes mellitus with diabetic polyneuropathy: Secondary | ICD-10-CM | POA: Diagnosis not present

## 2024-09-30 DIAGNOSIS — B351 Tinea unguium: Secondary | ICD-10-CM | POA: Diagnosis not present

## 2024-09-30 DIAGNOSIS — Z91198 Patient's noncompliance with other medical treatment and regimen for other reason: Secondary | ICD-10-CM

## 2024-09-30 NOTE — Progress Notes (Signed)
 1. Failure to attend appointment with reason given    Patient moved to earlier time slot due to weather.

## 2024-09-30 NOTE — Telephone Encounter (Signed)
 Prescription refill request for Xarelto  received.  Indication: AF Last office visit: 01/05/24  LELON Decent MD Weight: 76.5kg Age: 89 Scr: 0.78 on 07/04/24  Epic CrCl: 61.37  Based on above findings Xarelto  20mg  daily is the appropriate dose.  Refill approved.

## 2024-10-03 ENCOUNTER — Ambulatory Visit: Payer: Self-pay

## 2024-10-03 ENCOUNTER — Ambulatory Visit (INDEPENDENT_AMBULATORY_CARE_PROVIDER_SITE_OTHER): Admitting: Internal Medicine

## 2024-10-03 ENCOUNTER — Other Ambulatory Visit: Payer: Self-pay

## 2024-10-03 VITALS — BP 128/82 | Ht 64.5 in | Wt 170.8 lb

## 2024-10-03 VITALS — BP 128/82 | HR 86 | Resp 16 | Ht 64.5 in | Wt 170.0 lb

## 2024-10-03 DIAGNOSIS — R10A2 Flank pain, left side: Secondary | ICD-10-CM | POA: Diagnosis not present

## 2024-10-03 DIAGNOSIS — Z794 Long term (current) use of insulin: Secondary | ICD-10-CM

## 2024-10-03 DIAGNOSIS — R300 Dysuria: Secondary | ICD-10-CM

## 2024-10-03 DIAGNOSIS — N3090 Cystitis, unspecified without hematuria: Secondary | ICD-10-CM | POA: Diagnosis not present

## 2024-10-03 DIAGNOSIS — Z0000 Encounter for general adult medical examination without abnormal findings: Secondary | ICD-10-CM | POA: Diagnosis not present

## 2024-10-03 DIAGNOSIS — E1142 Type 2 diabetes mellitus with diabetic polyneuropathy: Secondary | ICD-10-CM

## 2024-10-03 LAB — POCT GLYCOSYLATED HEMOGLOBIN (HGB A1C): Hemoglobin A1C: 7.1 % — AB (ref 4.0–5.6)

## 2024-10-03 LAB — POCT URINALYSIS DIPSTICK
Bilirubin, UA: NEGATIVE
Blood, UA: POSITIVE
Glucose, UA: NEGATIVE
Ketones, UA: NEGATIVE
Nitrite, UA: POSITIVE
Odor: NORMAL
Protein, UA: POSITIVE — AB
Spec Grav, UA: 1.025 (ref 1.010–1.025)
Urobilinogen, UA: 0.2 U/dL
pH, UA: 5 (ref 5.0–8.0)

## 2024-10-03 MED ORDER — SULFAMETHOXAZOLE-TRIMETHOPRIM 800-160 MG PO TABS: 1.0000 | ORAL_TABLET | Freq: Two times a day (BID) | ORAL | 0 refills | Status: AC

## 2024-10-03 MED ORDER — INSULIN GLARGINE 100 UNIT/ML ~~LOC~~ SOLN: 30.0000 [IU] | Freq: Every day | SUBCUTANEOUS | 4 refills | Status: AC

## 2024-10-03 NOTE — Progress Notes (Signed)
 Established Patient Office Visit  Subjective:  Patient ID: Danielle [name], female    DOB: [birth year]  Age: 89 y.o. MRN: [id number]  CC:  Chief Complaint  Patient presents with   Medical Management of Chronic Issues    HPI Danielle [name] here for follow up on chronic medical conditions.    Discussed the use of AI scribe software for clinical note transcription with the patient, who gave verbal consent to proceed.  History of Present Illness  Danielle [name] is an 89 year old female with diabetes who presents for medication management and evaluation of left-sided back pain.  She notes significant left-sided back pain in the area of her left kidney, described as an achy, hurting pain that begins in the late afternoon and intensifies by bedtime, making it hard for her to walk. The pain started last week, is confined to her back without radiation, and is absent in the morning and during the day. She denies fever, burning with urination, or blood in the urine.  She has diabetes treated with metformin  and Lantus  insulin  pens. She recently changed from Lantus  vials to pens due to supply issues and is using Lantus  pens from her son, who is also diabetic. She has enough Lantus  pens for the next few months. Her A1c is stable at 7.1, and she is not having problems with her current diabetes regimen.  She stays active with household tasks and social activities but notes that her heart starts pounding with excessive physical activity, which limits how long she can exercise.   Hypertension/A.Fib: -Medications: Diltiazem  120 mg, Metoprolol  50 mg, Xarelto  20 mg -Follows with Cardiology -Patient is compliant with above medications and reports no side effects. -Denies any SOB, CP, vision changes, LE edema or symptoms of hypotension.  Does have some mild fatigue.  HLD: -Medications: Lipitor 10 mg -Patient is compliant with above medications and reports no side effects.  -Last lipid panel:  Lipid  Panel     Component Value Date/Time   CHOL 116 07/04/2024 1541   TRIG 433 (H) 07/04/2024 1541   HDL 39 (L) 07/04/2024 1541   CHOLHDL 3.0 07/04/2024 1541   VLDL 45 (H) 11/10/2008 0317   LDLCALC  07/04/2024 1541     Comment:     . LDL cholesterol not calculated. Triglyceride levels greater than 400 mg/dL invalidate calculated LDL results. . Reference range: <100 . Desirable range <100 mg/dL for primary prevention;   <70 mg/dL for patients with CHD or diabetic patients  with > or = 2 CHD risk factors. Danielle [name] is now calculated using the Martin-Hopkins  calculation, which is a validated novel method providing  better accuracy than the Friedewald equation in the  estimation of [name].  Danielle [name] et al. Danielle [name]. 7986;689(80): 2061-2068  (http://education.[facility].com/faq/FAQ164)     Diabetes, Type 2 with Neuropathy: -Last A1c 7.1% 9/25 -Medications: Metformin  1000 mg BID, Lantus  30 units at night, Gabapentin  300 mg TID -Patient is compliant with the above medications and reports no side effects.  -Eye exam: UTD  -Foot exam: Follows with Podiatry -Microalbumin: UTD -Statin: yes -PNA vaccine: UTD -Denies symptoms of hypoglycemia, polyuria, polydipsia, numbness extremities, foot ulcers/trauma.   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  -Having more balance changes with worsening tinnitus bilaterally (worse on left). No ear pain/pressure. No recent viral illness, does have some post-nasal drip.   Gout: -No  flare since last office visit -Takes colchicine  as needed  Health maintenance: -Blood work UTD -Mammogram 7/24 Birads-1 - will discuss next summer if the patient would like to continue with screening  -Colonoscopy 6/22, repeat every 5 years   Past Medical History:  Diagnosis Date   Anemia    Anxiety    Arrhythmia    Arthritis    Atrial fibrillation (HCC)    Cataract    bilateral   Colon polyps    Diabetes mellitus without complication (HCC)    type two   Dyspnea    GERD (gastroesophageal reflux disease)    Hypercholesteremia    Hypertension    Iron  deficiency anemia    Pancreatitis    Peripheral neuropathy    Refusal of blood transfusions as patient is Jehovah's Witness    Squamous cell skin cancer     Past Surgical History:  Procedure Laterality Date   APPENDECTOMY  1957   BREAST EXCISIONAL BIOPSY Right 1966   CATARACT EXTRACTION, BILATERAL     CHOLECYSTECTOMY  10/2008   lump removed right breast  1958   bENIGN   right ankle plate  8018   TOTAL HIP ARTHROPLASTY Left 07/13/2023   Procedure: TOTAL HIP ARTHROPLASTY ANTERIOR APPROACH;  Surgeon: Fidel Rogue, MD;  Location: [facility];  Service: Orthopedics;  Laterality: Left;   VAGINAL HYSTERECTOMY  1978    Family History  Problem Relation Age of Onset   Hypertension Mother    Stroke Mother    Diabetes Mother    Colon cancer Mother 56   Cerebral aneurysm Father    Colon cancer Sister        in her 74s   CAD Sister    Heart attack Sister    Uterine cancer Sister        in her late 25s   Colon cancer Sister        in her 29s   Parkinsonism Sister    Dementia Sister    Heart attack Brother    Heart attack Brother    Throat cancer Brother    Atrial fibrillation Brother    Breast cancer Neg Hx     Social History   Socioeconomic History   Marital status: Widowed    Spouse name: Not on file   Number of children: 3   Years of education: Not on file   Highest education level: Associate degree:  occupational, scientist, product/process development, or vocational program  Occupational History   Occupation: Retired  Tobacco Use   Smoking status: Never   Smokeless tobacco: Never  Vaping Use   Vaping status: Never Used  Substance and Sexual Activity   Alcohol  use: No    Alcohol /week: 0.0 standard drinks of alcohol    Drug use: No   Sexual activity: Not Currently  Other Topics Concern   Not on file  Social History Narrative   Not on file   Social Drivers of Health   Tobacco Use: Low Risk (10/03/2024)   Patient History    Smoking Tobacco Use: Never    Smokeless Tobacco Use: Never    Passive Exposure: Not on file  Financial Resource Strain: Low Risk (  12/31/2023)   Overall Financial Resource Strain (CARDIA)    Difficulty of Paying Living Expenses: Not very hard  Food Insecurity: No Food Insecurity (10/03/2024)   Epic    Worried About Programme Researcher, Broadcasting/film/video in the Last Year: Never true    Ran Out of Food in the Last Year: Never true  Transportation Needs: No Transportation Needs (10/03/2024)   Epic    Lack of Transportation (Medical): No    Lack of Transportation (Non-Medical): No  Physical Activity: Insufficiently Active (10/03/2024)   Exercise Vital Sign    Days of Exercise per Week: 2 days    Minutes of Exercise per Session: 20 min  Stress: No Stress Concern Present (10/03/2024)   [employer] of Occupational Health - Occupational Stress Questionnaire    [name]: Only a little  Social Connections: Moderately Integrated (10/03/2024)   Social Connection and Isolation Panel    Frequency of Communication with Friends and Family: Three times a week    Frequency of Social Gatherings with Friends and Family: Twice a week    Attends Religious Services: More than 4 times per year    Active Member of [employer] or Organizations: Yes    Attends Banker Meetings: More than 4 times per year    Marital Status: Widowed  Intimate Partner Violence: Not At Risk (10/03/2024)   Epic    Fear  of Current or Ex-Partner: No    Emotionally Abused: No    Physically Abused: No    Sexually Abused: No  Depression (PHQ2-9): Low Risk (10/03/2024)   Depression (PHQ2-9)    PHQ-2 Score: 0  Alcohol  Screen: Low Risk (10/03/2024)   Alcohol  Screen    Last Alcohol  Screening Score (AUDIT): 0  Housing: Unknown (10/03/2024)   Epic    Unable to Pay for Housing in the Last Year: No    Number of Times Moved in the Last Year: Not on file    Homeless in the Last Year: No  Utilities: Not At Risk (10/03/2024)   Epic    Threatened with loss of utilities: No  Health Literacy: Adequate Health Literacy (10/03/2024)   B1300 Health Literacy    Frequency of need for help with medical instructions: Never    ROS Review of Systems  Constitutional:  Negative for fever.  Genitourinary:  Positive for flank pain and urgency. Negative for frequency and hematuria.  All other systems reviewed and are negative.   Objective:   Today's Vitals: BP 128/82 (Cuff Size: Large)   Ht 5' 4.5 (1.638 m)   Wt 170 lb (77.1 kg)   BMI 28.73 kg/m   Physical Exam Constitutional:      Appearance: Normal appearance.  HENT:     Head: Normocephalic and atraumatic.  Eyes:     Conjunctiva/sclera: Conjunctivae normal.  Cardiovascular:     Rate and Rhythm: Normal rate and regular rhythm.  Pulmonary:     Effort: Pulmonary effort is normal.     Breath sounds: Normal breath sounds.  Musculoskeletal:     Lumbar back: Normal.  Skin:    General: Skin is warm and dry.  Neurological:     General: No focal deficit present.     Mental Status: She is alert. Mental status is at baseline.  Psychiatric:        Mood and Affect: Mood normal.        Behavior: Behavior normal.     Assessment & Plan:   Assessment & Plan  Type 2 diabetes  mellitus with diabetic polyneuropathy A1c at 7.1, regimen effective with metformin  and Lantus  pens. Discussed Mounjaro or Ozempic for glycemic control and weight management. - Prescribed  Lantus  vials, monitored insurance coverage. - Continue metformin . - Monitor insulin  supply.  Left flank pain Intermittent achy pain, likely muscular. Ruled out kidney stones or infection. - Ordered urinalysis which was positive for nitrates and leukocytes. - Start Bactrim  BID x 3 days. - Send urine for culture  General health maintenance Tetanus vaccination status unclear, last in 2013. Discussed TDAP importance. - Advised obtaining TDAP vaccine at pharmacy.  - POCT HgB A1C - insulin  glargine (LANTUS ) 100 UNIT/ML injection; Inject 0.3 mLs (30 Units total) into the skin daily.  Dispense: 10 mL; Refill: 4 - POCT Urinalysis Dipstick - Urine Culture - sulfamethoxazole -trimethoprim  (BACTRIM  DS) 800-160 MG tablet; Take 1 tablet by mouth 2 (two) times daily for 3 days.  Dispense: 6 tablet; Refill: 0  Follow-up: Return in about 3 months (around 01/01/2025).   Sharyle Fischer, DO

## 2024-10-03 NOTE — Patient Instructions (Addendum)
 Ms. Moragne,  Thank you for taking the time for your Medicare Wellness Visit. I appreciate your continued commitment to your health goals. Please review the care plan we discussed, and feel free to reach out if I can assist you further.  Please note that Annual Wellness Visits do not include a physical exam. Some assessments may be limited, especially if the visit was conducted virtually. If needed, we may recommend an in-person follow-up with your provider.  Ongoing Care Seeing your primary care provider every 3 to 6 months helps us  monitor your health and provide consistent, personalized care.   Referrals If a referral was made during today's visit and you haven't received any updates within two weeks, please contact the referred provider directly to check on the status.  Recommended Screenings:  Health Maintenance  Topic Date Due   Zoster (Shingles) Vaccine (1 of 2) Never done   COVID-19 Vaccine (3 - 2025-26 season) 06/24/2024   Hemoglobin A1C  01/01/2025   Complete foot exam   07/01/2025   Eye exam for diabetics  08/22/2025   Medicare Annual Wellness Visit  10/03/2025   Colon Cancer Screening  04/22/2026   Pneumococcal Vaccine for age over 57  Completed   Flu Shot  Completed   Osteoporosis screening with Bone Density Scan  Completed   Meningitis B Vaccine  Aged Out   DTaP/Tdap/Td vaccine  Discontinued       10/03/2024    2:48 PM  Advanced Directives  Does Patient Have a Medical Advance Directive? Yes  Type of Estate Agent of [place];Living will  Does patient want to make changes to medical advance directive? No - Patient declined  Copy of Healthcare Power of Attorney in Chart? Yes - validated most recent copy scanned in chart (See row information)    Vision: Annual vision screenings are recommended for early detection of glaucoma, cataracts, and diabetic retinopathy. These exams can also reveal signs of chronic conditions such as diabetes and high  blood pressure.  Dental: Annual dental screenings help detect early signs of oral cancer, gum disease, and other conditions linked to overall health, including heart disease and diabetes.  Please see the attached documents for additional preventive care recommendations.   NEXT AWV 10/09/25 @ 2:20 PM IN PERSON

## 2024-10-03 NOTE — Progress Notes (Signed)
 No chief complaint on file.    Subjective:   Danielle [name] is a 89 y.o. female who presents for a Medicare Annual Wellness Visit.  Fall Screening Falls in the past year?: 0 Number of falls in past year: 0 Was there an injury with Fall?: 0 Fall Risk Category Calculator: 0 Patient Fall Risk Level: Low Fall Risk  Fall Risk Patient at Risk for Falls Due to: No Fall Risks Fall risk Follow up: Falls evaluation completed    Allergies (verified) Amoxicillin, Hydrochlorothiazide, Shellfish allergy, Shrimp (diagnostic), Cleocin [clindamycin hcl], Codeine, and Penicillins   Current Medications (verified) Outpatient Encounter Medications as of 10/03/2024  Medication Sig   Accu-Chek Softclix Lancets lancets Use as instructed   atorvastatin  (LIPITOR) 10 MG tablet Take 1 tablet (10 mg total) by mouth daily.   Blood Glucose Monitoring Suppl (ACCU-CHEK GUIDE) w/Device KIT DxE11.65 Check BG tid   calcium  carbonate (OSCAL) 1500 (600 Ca) MG TABS tablet Take 600 mg of elemental calcium  by mouth 2 (two) times daily with a meal.   Cholecalciferol (VITAMIN D3) 1000 UNITS CAPS Take 1,000 Units by mouth in the morning and at bedtime.   colchicine  0.6 MG tablet TAKE 1 TABLET BY MOUTH ONCE DAILY AS NEEDED FOR GOUT FLARES   cyanocobalamin  (,VITAMIN B-12,) 1000 MCG/ML injection Inject 1,000 mcg into the muscle every 30 (thirty) days.   diltiazem  (DILT-XR) 120 MG 24 hr capsule Take 1 capsule by mouth once daily   EMBECTA INSULIN  SYR ULTRAFINE 31G X 15/64 0.5 ML MISC USE 1 SYRINGE ONCE DAILY   gabapentin  (NEURONTIN ) 300 MG capsule TAKE 3 CAPSULES BY MOUTH AT BEDTIME   glucose blood (ACCU-CHEK GUIDE TEST) test strip Use as instructed   insulin  detemir (LEVEMIR ) 100 UNIT/ML injection Inject 0.3 mLs (30 Units total) into the skin daily.   Insulin  Syringe-Needle U-100 (B-D INS SYR ULTRAFINE .5CC/30G) 30G X 1/2 0.5 ML MISC 1 each by Does not apply route daily.   metFORMIN  (GLUCOPHAGE ) 1000 MG tablet Take 1  tablet (1,000 mg total) by mouth 2 (two) times daily.   metoprolol  succinate (TOPROL -XL) 50 MG 24 hr tablet TAKE 1 TABLET BY MOUTH TWICE DAILY (TAKE  IN  THE  MORNING  AND  AT  BEDTIME)  (TAKE  WITH  OR  IMMEDIATELY  FOLLOWING  A  MEAL)   omeprazole  (PRILOSEC) 40 MG capsule Take 1 capsule by mouth once daily   Polyethyl Glycol-Propyl Glycol (SYSTANE) 0.4-0.3 % SOLN Place 1-2 drops into both eyes 3 (three) times daily as needed (dry/irritated eyes).   rivaroxaban  (XARELTO ) 20 MG TABS tablet TAKE 1 TABLET BY MOUTH ONCE DAILY WITH SUPPER   No facility-administered encounter medications on file as of 10/03/2024.    History: Past Medical History:  Diagnosis Date   Anemia    Anxiety    Arrhythmia    Arthritis    Atrial fibrillation (HCC)    Cataract    bilateral   Colon polyps    Diabetes mellitus without complication (HCC)    type two   Dyspnea    GERD (gastroesophageal reflux disease)    Hypercholesteremia    Hypertension    Iron  deficiency anemia    Pancreatitis    Peripheral neuropathy    Refusal of blood transfusions as patient is Jehovah's Witness    Squamous cell skin cancer    Past Surgical History:  Procedure Laterality Date   APPENDECTOMY  1957   BREAST EXCISIONAL BIOPSY Right 1966   CATARACT EXTRACTION, BILATERAL  CHOLECYSTECTOMY  10/2008   lump removed right breast  1958   bENIGN   right ankle plate  8018   TOTAL HIP ARTHROPLASTY Left 07/13/2023   Procedure: TOTAL HIP ARTHROPLASTY ANTERIOR APPROACH;  Surgeon: Fidel Rogue, MD;  Location: [facility];  Service: Orthopedics;  Laterality: Left;   VAGINAL HYSTERECTOMY  1978   Family History  Problem Relation Age of Onset   Hypertension Mother    Stroke Mother    Diabetes Mother    Colon cancer Mother 94   Cerebral aneurysm Father    Colon cancer Sister        in her 70s   CAD Sister    Heart attack Sister    Uterine cancer Sister        in her late 17s   Colon cancer Sister        in her 54s    Parkinsonism Sister    Dementia Sister    Heart attack Brother    Heart attack Brother    Throat cancer Brother    Atrial fibrillation Brother    Breast cancer Neg Hx    Social History   Occupational History   Occupation: Retired  Tobacco Use   Smoking status: Never   Smokeless tobacco: Never  Vaping Use   Vaping status: Never Used  Substance and Sexual Activity   Alcohol  use: No    Alcohol /week: 0.0 standard drinks of alcohol    Drug use: No   Sexual activity: Not Currently   Tobacco Counseling Counseling given: Not Answered  SDOH Screenings   Food Insecurity: No Food Insecurity (12/31/2023)  Housing: Low Risk (12/31/2023)  Transportation Needs: No Transportation Needs (12/31/2023)  Utilities: Not At Risk (09/28/2023)  Alcohol  Screen: Low Risk (01/01/2024)  Depression (PHQ2-9): Low Risk (01/01/2024)  Financial Resource Strain: Low Risk (12/31/2023)  Physical Activity: Insufficiently Active (12/31/2023)  Social Connections: Moderately Integrated (09/28/2023)  Stress: No Stress Concern Present (12/31/2023)  Tobacco Use: Low Risk (09/30/2024)  Health Literacy: Adequate Health Literacy (09/28/2023)   See flowsheets for full screening details  Depression Screen PHQ 2 & 9 Depression Scale- Over the past 2 weeks, how often have you been bothered by any of the following problems? Little interest or pleasure in doing things: 0 Feeling down, depressed, or hopeless (PHQ Adolescent also includes...irritable): 0 PHQ-2 Total Score: 0     Goals Addressed   None          Objective:    There were no vitals filed for this visit. There is no height or weight on file to calculate BMI.  Hearing/Vision screen No results found. Immunizations and Health Maintenance Health Maintenance  Topic Date Due   Zoster Vaccines- Shingrix (1 of 2) Never done   COVID-19 Vaccine (3 - 2025-26 season) 06/24/2024   Medicare Annual Wellness (AWV)  09/27/2024   HEMOGLOBIN A1C  01/01/2025   FOOT EXAM   07/01/2025   OPHTHALMOLOGY EXAM  08/22/2025   Colonoscopy  04/22/2026   Pneumococcal Vaccine: 50+ Years  Completed   Influenza Vaccine  Completed   Bone Density Scan  Completed   Meningococcal B Vaccine  Aged Out   DTaP/Tdap/Td  Discontinued        Assessment/Plan:  This is a routine wellness examination for Danielle [name].  Patient Care Team: Bernardo Fend, DO as PCP - General (Internal Medicine) O'Neal, Darryle Ned, MD as PCP - Cardiology (Cardiology) Charmayne Molly, MD as Consulting Physician (Ophthalmology)  I have personally reviewed and noted the following in the  patients chart:   Medical and social history Use of alcohol , tobacco or illicit drugs  Current medications and supplements including opioid prescriptions. Functional ability and status Nutritional status Physical activity Advanced directives List of other physicians Hospitalizations, surgeries, and ER visits in previous 12 months Vitals Screenings to include cognitive, depression, and falls Referrals and appointments  No orders of the defined types were placed in this encounter.  In addition, I have reviewed and discussed with patient certain preventive protocols, quality metrics, and best practice recommendations. A written personalized care plan for preventive services as well as general preventive health recommendations were provided to patient.   Jhonnie GORMAN Das, LPN   [id number]   No follow-ups on file.  After Visit Summary: (In Person-Printed) AVS printed and given to the patient  Nurse Notes: AGED OUT OF MAMMOGRAM, BDS; WANTS TO STILL GET COLONOSCOPY D/T FAMILY HX OF COLON CA

## 2024-10-04 LAB — URINE CULTURE
MICRO NUMBER:: 17344555
SPECIMEN QUALITY:: ADEQUATE

## 2024-10-06 NOTE — Progress Notes (Signed)
 Subjective:  Patient ID: Danielle [name], female    DOB: 16-May-1935,  MRN: [id number]  Danielle [name] presents to clinic today for at risk foot care with history of diabetic neuropathy and callus(es) of both feet and painful thick toenails that are difficult to trim. Painful toenails interfere with ambulation. Aggravating factors include wearing enclosed shoe gear. Pain is relieved with periodic professional debridement. Painful calluses are aggravated when weightbearing with and without shoegear. Pain is relieved with periodic professional debridement.  Chief Complaint  Patient presents with   Diabetes    A1c 7.6 . She has an appointment with her PCP Dr. Bernardo on Thursday    New problem(s): None.   PCP is Bernardo Fend, DO.  Allergies[1]  Review of Systems: Negative except as noted in the HPI.  Objective: No changes noted in today's physical examination. There were no vitals filed for this visit. Danielle [name] is a pleasant 89 y.o. female in NAD. AAO x 3.  Vascular Examination: Capillary refill time immediate b/l. Vascular status intact b/l with palpable DP pulses; faintly palpable PT pulses. Pedal hair absent b/l. No edema. No pain with calf compression b/l. Skin temperature gradient WNL b/l. No cyanosis or clubbing noted b/l. No ischemia or gangrene b/l.   Neurological Examination: Sensation grossly intact b/l with 10 gram monofilament. Vibratory sensation intact b/l. Pt has subjective symptoms of neuropathy.  Dermatological Examination: Evidence of old subungual hematoma left great toe. No erythema, no edema, no drainage, no fluctuance. Pedal skin with normal turgor, texture and tone b/l.  No open wounds. No interdigital macerations.   Toenails 1-5 b/l thick, discolored, elongated with subungual debris and pain on dorsal palpation.   Hyperkeratotic lesion(s) submet head 1 b/l and distal tip right 2nd digit.  No erythema, no edema, no drainage, no  fluctuance.  Musculoskeletal Examination: Muscle strength 5/5 to all lower extremity muscle groups bilaterally. Hammertoe(s) left great toe and right great toe.  Radiographs: None  Assessment/Plan: 1. Pain due to onychomycosis of toenails of both feet   2. Callus   3. Diabetic peripheral neuropathy associated with type 2 diabetes mellitus [facility])   Consent given for treatment. Patient examined.All patient's and/or POA's questions/concerns addressed on today's visit. Toenails 1-5 b/l debrided in length and girth without incident. Callus(es) submet head 1 b/l pared with sharp debridement without incident. Continue foot and shoe inspections daily. Monitor blood glucose per PCP/Endocrinologist's recommendations.Continue soft, supportive shoe gear daily. Report any pedal injuries to medical professional. Call office if there are any questions/concerns.  Return in about 3 months (around 12/29/2024).  Delon LITTIE Merlin, DPM      [facility] LOCATION: [address], [place] [address]                   Office [phone]   [facility] LOCATION: [address] [place], [place] [address] Office [phone]      [1]  Allergies Allergen Reactions   Amoxicillin Rash and Swelling    Throat swells  Tolerated Cephalosporin Date: 07/13/2023.  Other Reaction(s): rash, throat swelling   Hydrochlorothiazide Other (See Comments)    HA and Leg cramps   Shellfish Allergy    Shrimp (Diagnostic) Other (See  Comments)    Gout flares   Cleocin [Clindamycin Hcl] Rash   Codeine Rash    And edema  Other Reaction(s): edema/ rash   Penicillins Rash and Dermatitis    Tolerated Cephalosporin Date: 07/13/2023.

## 2024-10-07 ENCOUNTER — Other Ambulatory Visit: Payer: Self-pay

## 2024-10-07 ENCOUNTER — Ambulatory Visit: Payer: Self-pay | Admitting: Internal Medicine

## 2024-10-07 DIAGNOSIS — R10A2 Flank pain, left side: Secondary | ICD-10-CM

## 2024-10-07 DIAGNOSIS — R300 Dysuria: Secondary | ICD-10-CM

## 2024-10-07 DIAGNOSIS — N3090 Cystitis, unspecified without hematuria: Secondary | ICD-10-CM

## 2024-10-08 LAB — URINE CULTURE
MICRO NUMBER:: 17356538
SPECIMEN QUALITY:: ADEQUATE

## 2024-10-09 ENCOUNTER — Ambulatory Visit: Payer: Self-pay | Admitting: Internal Medicine

## 2024-10-23 ENCOUNTER — Ambulatory Visit: Payer: Self-pay

## 2024-10-23 ENCOUNTER — Encounter: Payer: Self-pay | Admitting: Family Medicine

## 2024-10-23 ENCOUNTER — Ambulatory Visit (INDEPENDENT_AMBULATORY_CARE_PROVIDER_SITE_OTHER): Admitting: Family Medicine

## 2024-10-23 ENCOUNTER — Ambulatory Visit: Admitting: Family Medicine

## 2024-10-23 ENCOUNTER — Ambulatory Visit (INDEPENDENT_AMBULATORY_CARE_PROVIDER_SITE_OTHER)

## 2024-10-23 VITALS — BP 118/74 | HR 83 | Temp 98.4°F | Resp 16 | Ht 64.0 in | Wt 170.0 lb

## 2024-10-23 DIAGNOSIS — R52 Pain, unspecified: Secondary | ICD-10-CM

## 2024-10-23 DIAGNOSIS — R051 Acute cough: Secondary | ICD-10-CM | POA: Diagnosis not present

## 2024-10-23 DIAGNOSIS — E538 Deficiency of other specified B group vitamins: Secondary | ICD-10-CM

## 2024-10-23 DIAGNOSIS — R0981 Nasal congestion: Secondary | ICD-10-CM

## 2024-10-23 LAB — POC COVID19/FLU A&B COMBO
Covid Antigen, POC: NEGATIVE
Influenza A Antigen, POC: NEGATIVE
Influenza B Antigen, POC: NEGATIVE

## 2024-10-23 MED ORDER — BENZONATATE 100 MG PO CAPS: 100.0000 mg | ORAL_CAPSULE | Freq: Two times a day (BID) | ORAL | 0 refills | Status: AC | PRN

## 2024-10-23 MED ORDER — FLUTICASONE PROPIONATE 50 MCG/ACT NA SUSP: 2.0000 | Freq: Every day | NASAL | 0 refills | Status: AC | PRN

## 2024-10-23 MED ORDER — CYANOCOBALAMIN 1000 MCG/ML IJ SOLN
1000.0000 ug | Freq: Once | INTRAMUSCULAR | Status: AC
  Administered 2024-10-23: 1000 ug via INTRAMUSCULAR

## 2024-10-23 NOTE — Progress Notes (Signed)
 "  Acute Office Visit  Subjective:     Patient ID: Danielle [name], female    DOB: September 26, 1935, 89 y.o.   MRN: [id number]  Chief Complaint  Patient presents with   Sinusitis    Started yesterday and has gotten worse   Generalized Body Aches    HPI Patient is in today voicing concerns that she has a sinus infection. She is a new patient to me. She voices yesterday morning she did not feel well and took an afternoon nap and then felt worse when she woke up. She voices complaints of sinus congestion, dry cough, body aches, headache, and nausea. She believes she was running a fever last night but did not check her temperature. She voices she took two Benadryl  last night and voices she did see an improvement in sinus congestion.  Review of Systems  Constitutional:  Positive for chills, fever and malaise/fatigue.  HENT:  Positive for congestion.   Respiratory:  Positive for cough. Negative for shortness of breath.   Gastrointestinal:  Positive for nausea. Negative for diarrhea and vomiting.  Musculoskeletal:  Positive for myalgias.  Neurological:  Positive for headaches.        Objective:    BP 118/74   Pulse 83   Temp 98.4 F (36.9 C)   Resp 16   Ht 5' 4 (1.626 m)   Wt 170 lb (77.1 kg)   SpO2 99%   BMI 29.18 kg/m    Physical Exam Constitutional:      Appearance: Normal appearance.  HENT:     Head: Normocephalic and atraumatic.     Right Ear: Tympanic membrane and ear canal normal.     Left Ear: Tympanic membrane and ear canal normal.     Mouth/Throat:     Pharynx: Oropharynx is clear. Uvula midline.  Cardiovascular:     Rate and Rhythm: Normal rate and regular rhythm.  Pulmonary:     Effort: Pulmonary effort is normal. No respiratory distress.     Breath sounds: Normal breath sounds. No wheezing, rhonchi or rales.  Skin:    General: Skin is warm and dry.  Neurological:     General: No focal deficit present.     Mental Status: She is alert.  Psychiatric:         Mood and Affect: Mood normal.        Behavior: Behavior normal.     Results for orders placed or performed in visit on 10/23/24  POC Covid19/Flu A&B Antigen  Result Value Ref Range   Influenza A Antigen, POC Negative Negative   Influenza B Antigen, POC Negative Negative   Covid Antigen, POC Negative Negative        Assessment & Plan:   Assessment & Plan Generalized body aches Patient is a pleasant 89 year old female seen today voicing complaints of body aches and other associated symptoms as per HPI. She states symptoms started yesterday.  Rapid flu and COVID testing negative in office.  -Recommended symptom management including Tylenol  500mg  every 4 to 6 hours PRN pain -Return precautions advised Orders:   POC Covid19/Flu A&B Antigen  Acute cough She complains of a cough starting yesterday. She describes cough to be dry, non-productive. Denies associated shortness of breath.  Rapid flu and COVID testing negative.  V/S stable. She is afebrile. Lungs clear to auscultation. No evidence of respiratory distress; unlabored effort.   -Recommended symptom management. Prescription provided to take Benzonatate 100mg  BID PRN cough  -Return precautions advised Orders:  benzonatate (TESSALON) 100 MG capsule; Take 1 capsule (100 mg total) by mouth 2 (two) times daily as needed for cough.  Sinus congestion Sinus congestion x 1 day. She voices she took Benadryl  last night and this seemed to help.  Rapid COVID and flu testing negative.  -Recommended symptom management including Flonase 50mcg/act nasal spray 2 sprays in each nostril once daily as needed -Recommended use of OTC Mucinex as needed for sinus congestion and OTC Claritin as needed for sinus congestion -Other recommendations included humidifier at bedtime -Return precautions advised Orders:   fluticasone (FLONASE) 50 MCG/ACT nasal spray; Place 2 sprays into both nostrils daily as needed (sinus congestion).      Meds  ordered this encounter  Medications   benzonatate (TESSALON) 100 MG capsule    Sig: Take 1 capsule (100 mg total) by mouth 2 (two) times daily as needed for cough.    Dispense:  20 capsule    Refill:  0   fluticasone (FLONASE) 50 MCG/ACT nasal spray    Sig: Place 2 sprays into both nostrils daily as needed (sinus congestion).    Dispense:  16 g    Refill:  0    Return if symptoms worsen or fail to improve.  LAYMON LOISE CORE, FNP   "

## 2024-10-23 NOTE — Patient Instructions (Signed)
 Flu and COVID testing was negative.   Ok to take Tylenol  500mg  every 4 to 6 hours as needed for fever or for pain (headaches or body aches).  You can take Mucinex (Guaifenesin) as needed for sinus congestion. You can take 600mg  of Mucinex every 12 hours as needed. This is an over the counter medication that you can find at any pharmacy and most grocery stores.   You can also take Claritin (Loratadine) 10mg  once daily as needed for sinus congestion. This is also an over the counter medication that can be found at any pharmacy and most grocery stores.  Using a humidifier at bedtime can be helpful for sinus congestion and cough.   Please return to the office for re-evaluation if symptoms persist for longer than 7 days, or return sooner if needed for new or worrisome symptoms.   Please present to the ER for immediate evaluation if you develop shortness of breath or chest pain.

## 2024-10-23 NOTE — Telephone Encounter (Signed)
 Patient in office for appointment with NP Brittany

## 2024-10-23 NOTE — Telephone Encounter (Addendum)
 FYI Only or Action Required?: Action required by provider: decline in ED- requesting appointment has b12 injection at 930 AM.  Patient was last seen in primary care on 10/03/2024 by Bernardo Fend, DO.  Called Nurse Triage reporting Facial Pain.  Symptoms began yesterday.  Interventions attempted: Nothing.  Symptoms are: stable.  Triage Disposition: Go to ED Now (Notify PCP)  Patient/caregiver understands and will follow disposition?: No, wishes to speak with PCP            Copied from CRM #[id number]. Topic: Clinical - Red Word Triage >> Oct 23, 2024  8:43 AM Mia F wrote: Red Word that prompted transfer to Nurse Triage: Severe headache, low grade fever, congestion and runny nose. She says she does not think she has shortness of breath. Reason for Disposition  Difficulty breathing or unusual sweating (e.g., sweating without exertion)  Answer Assessment - Initial Assessment Questions Patient reports severe headache 8/10 pain behind eyes in like face upper  forehead cheeks area not tops of head or back. Not worst headache of life.  No head injury.  Runny nose. Not as bad this morning. Dry cough last night.low grade fever last night. This morning doesn't have a fever. Last night two benydral   . Has had nausea hasn't even yet this morning. Have been very sweaty and has been when at rest this is new onset started last night . Shortness of breath with walking , exertion.  Patient advised ED patient states has b12 injection today 930 and would like to be seen for this. Advised patient this RN is not able tos schedule. Will call [facility] now      1. ONSET: When did the pain start? (e.g., minutes, hours, days)    Afternoon-  Last night  2. ONSET: Does the pain come and go, or has it been constant since it started? (e.g., constant, intermittent, fleeting)     Constant pretty  3. SEVERITY: How bad is the pain? (Scale 1-10; mild, moderate or severe)     Severe 8/10 4. LOCATION:  Where does it hurt?      Head , forehead  5. RASH: Is there any redness, rash, or swelling of your face?     Not reported  6. FEVER: Do you have a fever? If Yes, ask: What is it, how was it measured, and when did it start?      Doesn't think has a a fever  7. OTHER SYMPTOMS: Do you have any other symptoms? (e.g., fever, toothache, nasal discharge, nasal congestion, clicking sensation in jaw joint)     Patient denies the following chest pain, difficulty breathing at rest,  vomiting    Patient denies the following chest pain, difficulty breathing at rest,  vomiting  Protocols used: Face Pain-A-AH

## 2024-10-23 NOTE — Telephone Encounter (Signed)
 Called [facility] and spoke with Melissa and advised of ED refusal

## 2024-11-21 ENCOUNTER — Ambulatory Visit

## 2024-11-21 DIAGNOSIS — E538 Deficiency of other specified B group vitamins: Secondary | ICD-10-CM | POA: Diagnosis not present

## 2024-11-21 MED ORDER — CYANOCOBALAMIN 1000 MCG/ML IJ SOLN
1000.0000 ug | Freq: Once | INTRAMUSCULAR | Status: AC
  Administered 2024-11-21: 1000 ug via INTRAMUSCULAR

## 2024-11-21 NOTE — Progress Notes (Signed)
 Patient is in office today for a nurse visit for B12 Injection. Patient Injection was given in the  Left deltoid. Patient tolerated injection well.

## 2024-11-25 ENCOUNTER — Other Ambulatory Visit: Payer: Self-pay | Admitting: Internal Medicine

## 2024-11-25 DIAGNOSIS — E1142 Type 2 diabetes mellitus with diabetic polyneuropathy: Secondary | ICD-10-CM

## 2024-11-25 DIAGNOSIS — K219 Gastro-esophageal reflux disease without esophagitis: Secondary | ICD-10-CM

## 2024-11-28 ENCOUNTER — Other Ambulatory Visit: Payer: Self-pay | Admitting: Cardiovascular Disease

## 2024-12-19 ENCOUNTER — Ambulatory Visit

## 2024-12-24 ENCOUNTER — Ambulatory Visit

## 2024-12-30 ENCOUNTER — Ambulatory Visit: Admitting: Podiatry

## 2025-01-02 ENCOUNTER — Ambulatory Visit: Admitting: Internal Medicine

## 2025-01-09 ENCOUNTER — Ambulatory Visit: Admitting: Cardiovascular Disease

## 2025-10-09 ENCOUNTER — Ambulatory Visit
